# Patient Record
Sex: Female | Born: 1960 | Hispanic: Yes | Marital: Married | Smoking: Former smoker
Health system: Southern US, Community
[De-identification: ages and names within clinical notes are randomized; demographics above are authoritative.]

## PROBLEM LIST (undated history)

## (undated) DIAGNOSIS — N200 Calculus of kidney: Secondary | ICD-10-CM

## (undated) DIAGNOSIS — E785 Hyperlipidemia, unspecified: Secondary | ICD-10-CM

## (undated) DIAGNOSIS — N63 Unspecified lump in unspecified breast: Secondary | ICD-10-CM

## (undated) DIAGNOSIS — R112 Nausea with vomiting, unspecified: Secondary | ICD-10-CM

## (undated) DIAGNOSIS — Z87442 Personal history of urinary calculi: Secondary | ICD-10-CM

## (undated) DIAGNOSIS — R42 Dizziness and giddiness: Secondary | ICD-10-CM

## (undated) DIAGNOSIS — Z9889 Other specified postprocedural states: Secondary | ICD-10-CM

## (undated) DIAGNOSIS — M8580 Other specified disorders of bone density and structure, unspecified site: Secondary | ICD-10-CM

## (undated) HISTORY — PX: INCONTINENCE SURGERY: SHX676

## (undated) HISTORY — PX: OVARIAN CYST REMOVAL: SHX89

## (undated) HISTORY — DX: Other specified disorders of bone density and structure, unspecified site: M85.80

## (undated) HISTORY — PX: UMBILICAL HERNIA REPAIR: SHX196

## (undated) HISTORY — PX: TONSILLECTOMY: SUR1361

## (undated) HISTORY — DX: Calculus of kidney: N20.0

## (undated) HISTORY — PX: APPENDECTOMY: SHX54

## (undated) HISTORY — DX: Unspecified lump in unspecified breast: N63.0

## (undated) HISTORY — PX: BREAST BIOPSY: SHX20

## (undated) HISTORY — DX: Hyperlipidemia, unspecified: E78.5

## (undated) HISTORY — PX: BREAST LUMPECTOMY: SHX2

## (undated) HISTORY — PX: NOSE SURGERY: SHX723

## (undated) HISTORY — PX: CHOLECYSTECTOMY: SHX55

## (undated) HISTORY — PX: WRIST SURGERY: SHX841

## (undated) HISTORY — PX: ABDOMINAL HYSTERECTOMY: SHX81

## (undated) HISTORY — PX: ABDOMINOPLASTY: SUR9

---

## 2005-09-01 HISTORY — PX: TOTAL VAGINAL HYSTERECTOMY: SHX2548

## 2016-08-27 ENCOUNTER — Ambulatory Visit (INDEPENDENT_AMBULATORY_CARE_PROVIDER_SITE_OTHER): Payer: BLUE CROSS/BLUE SHIELD | Admitting: Family Medicine

## 2016-08-27 ENCOUNTER — Encounter: Payer: Self-pay | Admitting: Family Medicine

## 2016-08-27 VITALS — BP 138/88 | HR 63 | Ht 61.0 in | Wt 124.2 lb

## 2016-08-27 DIAGNOSIS — E785 Hyperlipidemia, unspecified: Secondary | ICD-10-CM | POA: Insufficient documentation

## 2016-08-27 DIAGNOSIS — N952 Postmenopausal atrophic vaginitis: Secondary | ICD-10-CM

## 2016-08-27 DIAGNOSIS — M8589 Other specified disorders of bone density and structure, multiple sites: Secondary | ICD-10-CM

## 2016-08-27 DIAGNOSIS — N200 Calculus of kidney: Secondary | ICD-10-CM | POA: Diagnosis not present

## 2016-08-27 DIAGNOSIS — Z0000 Encounter for general adult medical examination without abnormal findings: Secondary | ICD-10-CM

## 2016-08-27 DIAGNOSIS — N63 Unspecified lump in unspecified breast: Secondary | ICD-10-CM | POA: Diagnosis not present

## 2016-08-27 DIAGNOSIS — Z780 Asymptomatic menopausal state: Secondary | ICD-10-CM | POA: Insufficient documentation

## 2016-08-27 DIAGNOSIS — Z1389 Encounter for screening for other disorder: Secondary | ICD-10-CM

## 2016-08-27 DIAGNOSIS — M8580 Other specified disorders of bone density and structure, unspecified site: Secondary | ICD-10-CM | POA: Insufficient documentation

## 2016-08-27 DIAGNOSIS — E782 Mixed hyperlipidemia: Secondary | ICD-10-CM

## 2016-08-27 NOTE — Patient Instructions (Addendum)
Carpal Tunnel Syndrome Introduction Carpal tunnel syndrome is a condition that causes pain in your hand and arm. The carpal tunnel is a narrow area that is on the palm side of your wrist. Repeated wrist motion or certain diseases may cause swelling in the tunnel. This swelling can pinch the main nerve in the wrist (median nerve). Follow these instructions at home: If you have a splint:  Wear it as told by your doctor. Remove it only as told by your doctor.  Loosen the splint if your fingers:  Become numb and tingle.  Turn blue and cold.  Keep the splint clean and dry. General instructions  Take over-the-counter and prescription medicines only as told by your doctor.  Rest your wrist from any activity that may be causing your pain. If needed, talk to your employer about changes that can be made in your work, such as getting a wrist pad to use while typing.  If directed, apply ice to the painful area:  Put ice in a plastic bag.  Place a towel between your skin and the bag.  Leave the ice on for 20 minutes, 2-3 times per day.  Keep all follow-up visits as told by your doctor. This is important.  Do any exercises as told by your doctor, physical therapist, or occupational therapist. Contact a doctor if:  You have new symptoms.  Medicine does not help your pain.  Your symptoms get worse. This information is not intended to replace advice given to you by your health care provider. Make sure you discuss any questions you have with your health care provider. Document Released: 08/07/2011 Document Revised: 01/24/2016 Document Reviewed: 01/03/2015  2017 Elsevier    Please realize, EXERCISE IS MEDICINE!  -  American Heart Association [facility]) guidelines for exercise : If you are in good health, without any medical conditions, you should engage in 150 minutes of moderate intensity aerobic activity per week.  This means you should be huffing and puffing throughout your workout.    Engaging in regular exercise will improve brain function and memory, as well as improve mood, boost immune system and help with weight management.  As well as the other, more well-known effects of exercise such as decreasing blood sugar levels, decreasing blood pressure,  and decreasing bad cholesterol levels/ increasing good cholesterol levels.     -  The AHA strongly endorses consumption of a diet that contains a variety of foods from all the food categories with an emphasis on fruits and vegetables; fat-free and low-fat dairy products; cereal and grain products; legumes and nuts; and fish, poultry, and/or extra lean meats.    Excessive food intake, especially of foods high in saturated and trans fats, sugar, and salt, should be avoided.    Adequate water intake of roughly 1/2 of your weight in pounds, should equal the ounces of water per day you should drink.  So for instance, if you're 200 pounds, that would be 100 ounces of water per day.         Mediterranean Diet  Why follow it? Research shows. . Those who follow the Mediterranean diet have a reduced risk of heart disease  . The diet is associated with a reduced incidence of Parkinson's and Alzheimer's diseases . People following the diet may have longer life expectancies and lower rates of chronic diseases  . The Dietary Guidelines for Americans recommends the Mediterranean diet as an eating plan to promote health and prevent disease  What Is the Mediterranean Diet?  Marland Kitchen  Healthy eating plan based on typical foods and recipes of Mediterranean-style cooking . The diet is primarily a plant based diet; these foods should make up a majority of meals   Starches - Plant based foods should make up a majority of meals - They are an important sources of vitamins, minerals, energy, antioxidants, and fiber - Choose whole grains, foods high in fiber and minimally processed items  - Typical grain sources include wheat, oats, barley, corn, brown rice,  bulgar, farro, millet, polenta, couscous  - Various types of beans include chickpeas, lentils, fava beans, black beans, white beans   Fruits  Veggies - Large quantities of antioxidant rich fruits & veggies; 6 or more servings  - Vegetables can be eaten raw or lightly drizzled with oil and cooked  - Vegetables common to the traditional Mediterranean Diet include: artichokes, arugula, beets, broccoli, brussel sprouts, cabbage, carrots, celery, collard greens, cucumbers, eggplant, kale, leeks, lemons, lettuce, mushrooms, okra, onions, peas, peppers, potatoes, pumpkin, radishes, rutabaga, shallots, spinach, sweet potatoes, turnips, zucchini - Fruits common to the Mediterranean Diet include: apples, apricots, avocados, cherries, clementines, dates, figs, grapefruits, grapes, melons, nectarines, oranges, peaches, pears, pomegranates, strawberries, tangerines  Fats - Replace butter and margarine with healthy oils, such as olive oil, canola oil, and tahini  - Limit nuts to no more than a handful a day  - Nuts include walnuts, almonds, pecans, pistachios, pine nuts  - Limit or avoid candied, honey roasted or heavily salted nuts - Olives are central to the [employer] - can be eaten whole or used in a variety of dishes   Meats Protein - Limiting red meat: no more than a few times a month - When eating red meat: choose lean cuts and keep the portion to the size of deck of cards - Eggs: approx. 0 to 4 times a week  - Fish and lean poultry: at least 2 a week  - Healthy protein sources include, chicken, [place], lean beef, lamb - Increase intake of seafood such as tuna, salmon, trout, mackerel, shrimp, scallops - Avoid or limit high fat processed meats such as sausage and bacon  Dairy - Include moderate amounts of low fat dairy products  - Focus on healthy dairy such as fat free yogurt, skim milk, low or reduced fat cheese - Limit dairy products higher in fat such as whole or 2% milk, cheese, ice cream   Alcohol - Moderate amounts of red wine is ok  - No more than 5 oz daily for women (all ages) and men older than age 70  - No more than 10 oz of wine daily for men younger than 6  Other - Limit sweets and other desserts  - Use herbs and spices instead of salt to flavor foods  - Herbs and spices common to the traditional Mediterranean Diet include: basil, bay leaves, chives, cloves, cumin, fennel, garlic, lavender, marjoram, mint, oregano, parsley, pepper, rosemary, sage, savory, sumac, tarragon, thyme   It's not just a diet, it's a lifestyle:  . The Mediterranean diet includes lifestyle factors typical of those in the region  . Foods, drinks and meals are best eaten with others and savored . Daily physical activity is important for overall good health . This could be strenuous exercise like running and aerobics . This could also be more leisurely activities such as walking, housework, yard-work, or taking the stairs . Moderation is the key; a balanced and healthy diet accommodates most foods and drinks . Consider portion sizes and  frequency of consumption of certain foods   Meal Ideas & Options:  . Breakfast:  o Whole wheat toast or whole wheat English muffins with peanut butter & hard boiled egg o Steel cut oats topped with apples & cinnamon and skim milk  o Fresh fruit: banana, strawberries, melon, berries, peaches  o Smoothies: strawberries, bananas, greek yogurt, peanut butter o Low fat greek yogurt with blueberries and granola  o Egg white omelet with spinach and mushrooms o Breakfast couscous: whole wheat couscous, apricots, skim milk, cranberries  . Sandwiches:  o Hummus and grilled vegetables (peppers, zucchini, squash) on whole wheat bread   o Grilled chicken on whole wheat pita with lettuce, tomatoes, cucumbers or tzatziki  o Tuna salad on whole wheat bread: tuna salad made with greek yogurt, olives, red peppers, capers, green onions o Garlic rosemary lamb pita: lamb sauted  with garlic, rosemary, salt & pepper; add lettuce, cucumber, greek yogurt to pita - flavor with lemon juice and black pepper  . Seafood:  o Mediterranean grilled salmon, seasoned with garlic, basil, parsley, lemon juice and black pepper o Shrimp, lemon, and spinach whole-grain pasta salad made with low fat greek yogurt  o Seared scallops with lemon orzo  o Seared tuna steaks seasoned salt, pepper, coriander topped with tomato mixture of olives, tomatoes, olive oil, minced garlic, parsley, green onions and cappers  . Meats:  o Herbed greek chicken salad with kalamata olives, cucumber, feta  o Red bell peppers stuffed with spinach, bulgur, lean ground beef (or lentils) & topped with feta   o Kebabs: skewers of chicken, tomatoes, onions, zucchini, squash  o [place] burgers: made with red onions, mint, dill, lemon juice, feta cheese topped with roasted red peppers . Vegetarian o Cucumber salad: cucumbers, artichoke hearts, celery, red onion, feta cheese, tossed in olive oil & lemon juice  o Hummus and whole grain pita points with a greek salad (lettuce, tomato, feta, olives, cucumbers, red onion) o Lentil soup with celery, carrots made with vegetable broth, garlic, salt and pepper  o Tabouli salad: parsley, bulgur, mint, scallions, cucumbers, tomato, radishes, lemon juice, olive oil, salt and pepper.

## 2016-08-27 NOTE — Progress Notes (Signed)
New patient office visit note:  Impression and Recommendations:    1. Encounter for wellness examination   2. Screening for multiple conditions   3. Mixed hyperlipidemia   4. Osteopenia of multiple sites   5. Benign breast lumps- B/L FNA's - both Benign   6. s/p Calcium nephrolithiasis   7. Vaginal atrophy     Benign breast lumps- B/L FNA's - both Benign Goes q every yr- due every Jan  Osteopenia-  q 2 yrs bone scan-  last one was bone density 2015 Was on fosamax 3 yrs or so- stopped 2105.    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  - I still recommend we check vitamin D levels in the future.  h/o Hyperlipidemia A/  first found out about condition approximately 7 yrs ago or so.  No S-E to simvastatin.  P/   Cont meds, will check FLP, CMP near future as pt is NOT fasting today.     Vaginal atrophy A)  Started meds about one year ago.  Works well and no-s-e  P)  Cont meds.      Orders Placed This Encounter  Procedures  . CBC with Differential/Platelet  . Hemoglobin A1c  . Lipid panel  . T4, free  . TSH  . VITAMIN D 25 Hydroxy (Vit-D Deficiency, Fractures)     New Prescriptions   No medications on file     Modified Medications   No medications on file     Discontinued Medications   No medications on file     The patient was counseled, risk factors were discussed, anticipatory guidance given.  Gross side effects, risk and benefits, and alternatives of medications discussed with patient.  Patient is aware that all medications have potential side effects and we are unable to predict every side effect or drug-drug interaction that may occur.  Expresses verbal understanding and consents to current therapy plan and treatment regimen.  Return in about 3 months (around 11/25/2016), or for chronic mgt dx, for lab only OV near future- CMP, CBC, FLP, VIt D, TSH, Free T4.  Please see AVS handed out to patient at  the end of our visit for further patient instructions/ counseling done pertaining to today's office visit.    Note: This document was prepared using Dragon voice recognition software and may include unintentional dictation errors.  ----------------------------------------------------------------------------------------------------------------------    Subjective:    Chief Complaint  Patient presents with  . Establish Care    HPI: Rachael Clark is a pleasant 55 y.o. female who presents to [place] at [facility] today to review their medical history with me and establish care.   I asked the patient to review their chronic problem list with me to ensure everything was updated and accurate.    Moved here recently from [place].   Partner- is Marcello Moores- been together for one year.   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  Patient has 3 children ages 102, 18 and 42.  They're all out of the house.  Patient quit smoking in 1997.  She works out approximately 30 minutes to an hour 2 days a week.  She enjoys an alcoholic beverage 4-5 days a week.  Patient with history of osteopenia, breast lumps and calcium oxalate nephrolithiasis.  Patient some questions about carpal tunnel syndrome.  We discussed and handouts were provided.      Wt Readings from Last 3 Encounters:  08/27/16 124 lb 3.2 oz (56.3 kg)   BP Readings from Last 3 Encounters:  08/27/16 138/88   Pulse Readings from Last 3 Encounters:  08/27/16 63   BMI Readings from Last 3 Encounters:  08/27/16 23.47 kg/m    Patient Care Team    Relationship Specialty Notifications Start End  Mellody Dance, DO PCP - General Family Medicine  08/27/16     Patient Active Problem List   Diagnosis Date Noted  . Benign breast lumps- B/L FNA's - both Benign 08/27/2016  . Osteopenia-  q 2 yrs bone scan-  last one was bone density 2015 08/27/2016    . 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 h/o Hyperlipidemia 08/27/2016  . Vaginal atrophy 08/27/2016     Past Medical History:  Diagnosis Date  . Benign breast lumps   . Hyperlipidemia   . Kidney stone   . Osteopenia      Past Medical History:  Diagnosis Date  . Benign breast lumps   . Hyperlipidemia   . Kidney stone   . Osteopenia      Past Surgical History:  Procedure Laterality Date  . ABDOMINAL HYSTERECTOMY    . APPENDECTOMY    . BREAST LUMPECTOMY Bilateral   . CHOLECYSTECTOMY    . OVARIAN CYST REMOVAL    . TONSILLECTOMY    . WRIST SURGERY Right      Family History  Problem Relation Age of Onset  . Osteoporosis Mother   . Dementia Mother   . Healthy Father   . Healthy Sister   . Healthy Brother   . Healthy Daughter   . Healthy Son   . Healthy Sister   . Healthy Sister   . Healthy Brother   . Healthy Son      History  Drug Use No    History  Alcohol Use  . 2.4 oz/week  . 4 Glasses of wine per week    History  Smoking Status  . Former Smoker  . Packs/day: 0.25  . Years: 4.00  . Quit date: 05/02/1996  Smokeless Tobacco  . Never Used     Patient's Medications  New Prescriptions   No medications on file  Previous Medications   SIMVASTATIN (ZOCOR) 10 MG TABLET    Take 1 tablet by mouth daily.   YUVAFEM 10 MCG TABS VAGINAL TABLET    Take 1 tablet by mouth 2 (two) times a week.  Modified Medications   No medications on file  Discontinued Medications   No medications on file    Allergies: Patient has no known allergies.  Review of Systems  Constitutional: Negative for chills, diaphoresis, fever, malaise/fatigue and weight loss.  HENT: Negative for congestion, sore throat and tinnitus.   Eyes: Negative for blurred vision, double vision and photophobia.  Respiratory: Negative for cough and wheezing.   Cardiovascular: Negative for chest pain and palpitations.  Gastrointestinal: Negative for blood in stool, diarrhea, nausea and  vomiting.  Genitourinary: Negative for dysuria, frequency and urgency.  Musculoskeletal: Negative for joint pain and myalgias.  Skin: Negative for itching and rash.  Neurological: Negative for dizziness, focal weakness, weakness and headaches.  Endo/Heme/Allergies: Negative for environmental allergies and polydipsia. Does not bruise/bleed easily.  Psychiatric/Behavioral: Negative for depression and memory loss. The patient is not nervous/anxious and does not have insomnia.      Objective:    Blood pressure 138/88, pulse 63,  height 5\' 1"  (1.549 m), weight 124 lb 3.2 oz (56.3 kg). Body mass index is 23.47 kg/m. General: Well Developed, well nourished, and in no acute distress.  Neuro: Alert and oriented x3, extra-ocular muscles intact, sensation grossly intact.  HEENT: Normocephalic, atraumatic, pupils equal round reactive to light, neck supple Skin: no gross rashes  Cardiac: Regular rate and rhythm Respiratory: Essentially clear to auscultation bilaterally. Not using accessory muscles, speaking in full sentences.  Abdominal: not grossly distended Musculoskeletal: Ambulates w/o diff, FROM * 4 ext.  Vasc: less 2 sec cap RF, warm and pink  Psych:  No HI/SI, judgement and insight good, Euthymic mood. Full Affect.

## 2016-08-27 NOTE — Assessment & Plan Note (Signed)
>>  ASSESSMENT AND PLAN FOR OSTEOPENIA-  Q 2 YRS BONE SCAN-  LAST ONE WAS BONE DENSITY 2015 WRITTEN ON 09/21/2016  8:41 PM BY OPALSKI, [place], DO  Was on fosamax 3 yrs or so- stopped 2105.    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  - I still recommend we check vitamin D levels in the future.

## 2016-08-27 NOTE — Assessment & Plan Note (Signed)
A)  Started meds about one year ago.  Works well and no-s-e  P)  Cont meds.

## 2016-08-27 NOTE — Assessment & Plan Note (Addendum)
A/  first found out about condition approximately 7 yrs ago or so.  No S-E to simvastatin.  P/   Cont meds, will check FLP, CMP near future as pt is NOT fasting today.

## 2016-08-27 NOTE — Assessment & Plan Note (Signed)
Goes q every yr- due every Jan

## 2016-08-27 NOTE — Assessment & Plan Note (Addendum)
Was on fosamax 3 yrs or so- stopped 2105.    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  - I still recommend we check vitamin D levels in the future.

## 2016-09-09 ENCOUNTER — Other Ambulatory Visit: Payer: Self-pay

## 2016-09-09 ENCOUNTER — Other Ambulatory Visit (INDEPENDENT_AMBULATORY_CARE_PROVIDER_SITE_OTHER): Payer: BLUE CROSS/BLUE SHIELD

## 2016-09-09 DIAGNOSIS — Z131 Encounter for screening for diabetes mellitus: Secondary | ICD-10-CM

## 2016-09-09 DIAGNOSIS — R5383 Other fatigue: Secondary | ICD-10-CM

## 2016-09-09 DIAGNOSIS — M8589 Other specified disorders of bone density and structure, multiple sites: Secondary | ICD-10-CM | POA: Diagnosis not present

## 2016-09-09 DIAGNOSIS — N200 Calculus of kidney: Secondary | ICD-10-CM

## 2016-09-09 DIAGNOSIS — N952 Postmenopausal atrophic vaginitis: Secondary | ICD-10-CM

## 2016-09-09 DIAGNOSIS — E782 Mixed hyperlipidemia: Secondary | ICD-10-CM

## 2016-09-09 DIAGNOSIS — N63 Unspecified lump in unspecified breast: Secondary | ICD-10-CM

## 2016-09-10 LAB — COMPREHENSIVE METABOLIC PANEL
A/G RATIO: 2 (ref 1.2–2.2)
ALK PHOS: 58 IU/L (ref 39–117)
ALT: 21 IU/L (ref 0–32)
AST: 18 IU/L (ref 0–40)
Albumin: 4.5 g/dL (ref 3.5–5.5)
BILIRUBIN TOTAL: 0.8 mg/dL (ref 0.0–1.2)
BUN/Creatinine Ratio: 21 (ref 9–23)
BUN: 14 mg/dL (ref 6–24)
CHLORIDE: 102 mmol/L (ref 96–106)
CO2: 24 mmol/L (ref 18–29)
Calcium: 9.1 mg/dL (ref 8.7–10.2)
Creatinine, Ser: 0.66 mg/dL (ref 0.57–1.00)
GFR calc non Af Amer: 100 mL/min/{1.73_m2} (ref >59)
GFR, EST AFRICAN AMERICAN: 115 mL/min/{1.73_m2} (ref >59)
GLUCOSE: 99 mg/dL (ref 65–99)
Globulin, Total: 2.2 g/dL (ref 1.5–4.5)
POTASSIUM: 4.5 mmol/L (ref 3.5–5.2)
Sodium: 141 mmol/L (ref 134–144)
Total Protein: 6.7 g/dL (ref 6.0–8.5)

## 2016-09-10 LAB — CBC WITH DIFFERENTIAL/PLATELET
BASOS ABS: 0 10*3/uL (ref 0.0–0.2)
Basos: 0 %
EOS (ABSOLUTE): 0.2 10*3/uL (ref 0.0–0.4)
Eos: 2 %
HEMOGLOBIN: 12.7 g/dL (ref 11.1–15.9)
Hematocrit: 37.3 % (ref 34.0–46.6)
IMMATURE GRANS (ABS): 0 10*3/uL (ref 0.0–0.1)
Immature Granulocytes: 0 %
LYMPHS: 22 %
Lymphocytes Absolute: 1.8 10*3/uL (ref 0.7–3.1)
MCH: 31.7 pg (ref 26.6–33.0)
MCHC: 34 g/dL (ref 31.5–35.7)
MCV: 93 fL (ref 79–97)
MONOCYTES: 6 %
Monocytes Absolute: 0.5 10*3/uL (ref 0.1–0.9)
NEUTROS PCT: 70 %
Neutrophils Absolute: 5.6 10*3/uL (ref 1.4–7.0)
PLATELETS: 274 10*3/uL (ref 150–379)
RBC: 4.01 x10E6/uL (ref 3.77–5.28)
RDW: 13.4 % (ref 12.3–15.4)
WBC: 8 10*3/uL (ref 3.4–10.8)

## 2016-09-10 LAB — LIPID PANEL
CHOL/HDL RATIO: 2.2 ratio (ref 0.0–4.4)
Cholesterol, Total: 195 mg/dL (ref 100–199)
HDL: 89 mg/dL (ref >39)
LDL Calculated: 96 mg/dL (ref 0–99)
Triglycerides: 51 mg/dL (ref 0–149)
VLDL Cholesterol Cal: 10 mg/dL (ref 5–40)

## 2016-09-10 LAB — HEMOGLOBIN A1C
ESTIMATED AVERAGE GLUCOSE: 114 mg/dL
HEMOGLOBIN A1C: 5.6 % (ref 4.8–5.6)

## 2016-09-10 LAB — T4, FREE: FREE T4: 1.29 ng/dL (ref 0.82–1.77)

## 2016-09-10 LAB — TSH: TSH: 0.804 u[IU]/mL (ref 0.450–4.500)

## 2016-09-10 LAB — VITAMIN D 25 HYDROXY (VIT D DEFICIENCY, FRACTURES): VIT D 25 HYDROXY: 39.1 ng/mL (ref 30.0–100.0)

## 2016-09-25 ENCOUNTER — Other Ambulatory Visit: Payer: Self-pay

## 2016-09-25 ENCOUNTER — Telehealth: Payer: Self-pay | Admitting: Family Medicine

## 2016-09-25 MED ORDER — SIMVASTATIN 10 MG PO TABS: 10.0000 mg | ORAL_TABLET | Freq: Every day | ORAL | 0 refills | Status: DC

## 2016-09-25 NOTE — Telephone Encounter (Signed)
Pt called states need Simvastatin refill cld into [facility] pharmacy in [place].  Thanks Baker Janus

## 2016-10-10 ENCOUNTER — Other Ambulatory Visit: Payer: Self-pay | Admitting: Family Medicine

## 2016-11-11 ENCOUNTER — Ambulatory Visit: Payer: BLUE CROSS/BLUE SHIELD | Admitting: Family Medicine

## 2016-11-12 ENCOUNTER — Other Ambulatory Visit: Payer: Self-pay | Admitting: Family Medicine

## 2016-11-12 NOTE — Telephone Encounter (Signed)
We have not prescribed this medication for the patient before.  Please authorize if appropriate.  Charyl Bigger, CMA

## 2016-11-20 ENCOUNTER — Ambulatory Visit: Payer: BLUE CROSS/BLUE SHIELD | Admitting: Family Medicine

## 2017-01-07 ENCOUNTER — Other Ambulatory Visit: Payer: Self-pay | Admitting: Family Medicine

## 2017-01-22 ENCOUNTER — Encounter: Payer: Self-pay | Admitting: Family Medicine

## 2017-01-22 ENCOUNTER — Ambulatory Visit (INDEPENDENT_AMBULATORY_CARE_PROVIDER_SITE_OTHER): Payer: BLUE CROSS/BLUE SHIELD | Admitting: Family Medicine

## 2017-01-22 VITALS — BP 101/69 | HR 65 | Temp 97.6°F | Ht 61.0 in | Wt 124.0 lb

## 2017-01-22 DIAGNOSIS — N63 Unspecified lump in unspecified breast: Secondary | ICD-10-CM

## 2017-01-22 DIAGNOSIS — E782 Mixed hyperlipidemia: Secondary | ICD-10-CM | POA: Diagnosis not present

## 2017-01-22 DIAGNOSIS — Z1231 Encounter for screening mammogram for malignant neoplasm of breast: Secondary | ICD-10-CM | POA: Insufficient documentation

## 2017-01-22 DIAGNOSIS — N952 Postmenopausal atrophic vaginitis: Secondary | ICD-10-CM | POA: Diagnosis not present

## 2017-01-22 DIAGNOSIS — M7552 Bursitis of left shoulder: Secondary | ICD-10-CM

## 2017-01-22 DIAGNOSIS — G8929 Other chronic pain: Secondary | ICD-10-CM

## 2017-01-22 DIAGNOSIS — T148XXA Other injury of unspecified body region, initial encounter: Secondary | ICD-10-CM | POA: Insufficient documentation

## 2017-01-22 DIAGNOSIS — M25512 Pain in left shoulder: Secondary | ICD-10-CM

## 2017-01-22 DIAGNOSIS — M069 Rheumatoid arthritis, unspecified: Secondary | ICD-10-CM | POA: Diagnosis not present

## 2017-01-22 MED ORDER — ESTRADIOL 10 MCG VA TABS: ORAL_TABLET | VAGINAL | 3 refills | Status: DC

## 2017-01-22 MED ORDER — MELOXICAM 15 MG PO TABS: 15.0000 mg | ORAL_TABLET | Freq: Every day | ORAL | 1 refills | Status: DC

## 2017-01-22 MED ORDER — SIMVASTATIN 10 MG PO TABS: 10.0000 mg | ORAL_TABLET | Freq: Every day | ORAL | 1 refills | Status: DC

## 2017-01-22 NOTE — Assessment & Plan Note (Signed)
-   Counseled patient on connection between muscle strain to over compensate for her left shoulder pain.  Handouts provided. Anti-inflammatories and moist heat to muscles only. Recommend physical therapy which patient declines today.

## 2017-01-22 NOTE — Assessment & Plan Note (Signed)
Refill of vaginal tablets given. Patient will need Pap smear every 3 years.

## 2017-01-22 NOTE — Assessment & Plan Note (Signed)
-  RF of meds given to pt - No complaints and tolerating well.  Last lipid panel as follows:  Lab Results  Component Value Date   CHOL 195 09/09/2016   HDL 89 09/09/2016   LDLCALC 96 09/09/2016   TRIG 51 09/09/2016   CHOLHDL 2.2 09/09/2016    Hepatic Function Latest Ref Rng & Units 09/09/2016  Total Protein 6.0 - 8.5 g/dL 6.7  Albumin 3.5 - 5.5 g/dL 4.5  AST 0 - 40 IU/L 18  ALT 0 - 32 IU/L 21  Alk Phosphatase 39 - 117 IU/L 58  Total Bilirubin 0.0 - 1.2 mg/dL 0.8

## 2017-01-22 NOTE — Assessment & Plan Note (Signed)
Patient declines x-ray today as I discussed the limited utility of it in this case.  - Ice 15 minutes several times a day.    Avoid repetitive activities-which patient cannot cut she works and does a lot of lifting.  Patient declined physical therapy referral and understands this is definitive treatment for this condition.  If we cannot control her symptoms with ice and anti-inflammatories, it was refer her to sports med or orthopedist for injection

## 2017-01-22 NOTE — Assessment & Plan Note (Signed)
Referral for mammogram placed today. 

## 2017-01-22 NOTE — Assessment & Plan Note (Addendum)
-   Recommended rehabilitation the patient declined today.  After discussion we decided to hold off on rheumatoid lab work as she has significant clinical findings and this would not change our management strategy at this time.  We will consider rheumatology referral in the future if we cannot control her symptoms.  Counseling done regarding disease process and various handouts provided  She had questions regarding B12 would help with her joint pains.  I recommended to reck in that she read on the [facility] website for recommended doses that may be beneficial for her symptoms.  All questions answered

## 2017-01-22 NOTE — Patient Instructions (Signed)
Please see handouts on shoulder bursitis as well as muscle strain and spasms I gave you.  Please note to use ice 15 minutes several times per day to shoulder and swollen joints.   You can use heat on spasms and muscle strains. - If you like a referral to physical therapy please let me know if this really is part of the definitive treatment for these conditions.  - Tumeric is a natural substance which can be helpful in arthritic and chronic inflammatory conditions.  Since this is not FDA approved you can do online research at the [facility] and see what doses they recommend of tumeric for your arthritis.  - Put in referral for mammogram.  They should call you for an appointment, if they do not please let Dorothea Ogle know.     Rheumatoid Arthritis Rheumatoid arthritis (RA) is a long-term (chronic) disease. RA causes inflammation in your joints. Your joints may feel painful, stiff, swollen, warm, or tender. RA may start slowly. Usually, it affects the small joints of the hands and feet. It can also affect other parts of the body, even the heart, eyes, or lungs. Symptoms of RA often come and go. Sometimes, symptoms get worse for a while. These are called flares. There is no cure for RA, but your doctor will work with you to find the best treatment option for you. This will depend on how the disease is changing in your body. Follow these instructions at home:  Take over-the-counter and prescription medicines only as told by your doctor. Your doctor may change (adjust) your medicines every 3 months.  Start an exercise program as told by your doctor.  Rest when you have a flare.  Return to your normal activities as told by your doctor. Ask your doctor what activities are safe for you.  Keep all follow-up visits as told by your doctor. This is important. Contact a doctor if:  You have a flare.  You have a fever.  You have problems (side effects) because of your medicines. Get help right away  if:  You have chest pain.  You have trouble breathing.  You have a hot, painful joint all of a sudden, and it is worse than your usual joint aches. This information is not intended to replace advice given to you by your health care provider. Make sure you discuss any questions you have with your health care provider. Document Released: 11/10/2011 Document Revised: 01/24/2016 Document Reviewed: 05/31/2015 Elsevier Interactive Patient Education  2017 [employer].

## 2017-01-22 NOTE — Progress Notes (Signed)
Impression and Recommendations:    1. Benign breast lumps- B/L FNA's - both Benign   2. Mixed hyperlipidemia   3. Vaginal atrophy   4. Rheumatoid arthritis involving both hands, unspecified rheumatoid factor presence (Rachael Clark)   5. Chronic left shoulder pain   6. Chronic shoulder bursitis, left   7. Muscle strain   8. Encounter for screening mammogram for breast cancer      Benign breast lumps- B/L FNA's - both Benign Referral for mammogram placed today.  h/o Hyperlipidemia - RF of meds given to pt - No complaints and tolerating well.  Last lipid panel as follows:  Lab Results  Component Value Date   CHOL 195 09/09/2016   HDL 89 09/09/2016   LDLCALC 96 09/09/2016   TRIG 51 09/09/2016   CHOLHDL 2.2 09/09/2016    Hepatic Function Latest Ref Rng & Units 09/09/2016  Total Protein 6.0 - 8.5 g/dL 6.7  Albumin 3.5 - 5.5 g/dL 4.5  AST 0 - 40 IU/L 18  ALT 0 - 32 IU/L 21  Alk Phosphatase 39 - 117 IU/L 58  Total Bilirubin 0.0 - 1.2 mg/dL 0.8      Vaginal atrophy Refill of vaginal tablets given. Patient will need Pap smear every 3 years.  Muscle strain - Counseled patient on connection between muscle strain to over compensate for her left shoulder pain.  Handouts provided. Anti-inflammatories and moist heat to muscles only. Recommend physical therapy which patient declines today.  Rheumatoid arthritis involving both hands (Rachael Clark) - Recommended rehabilitation the patient declined today.  After discussion we decided to hold off on rheumatoid lab work as she has significant clinical findings and this would not change our management strategy at this time.  We will consider rheumatology referral in the future if we cannot control her symptoms.  Counseling done regarding disease process and various handouts provided  She had questions regarding B12 would help with her joint pains.  I recommended to reck in that she read on the Rachael Clark website for recommended doses that  may be beneficial for her symptoms.  All questions answered      Chronic left shoulder pain Patient declines x-ray today as I discussed the limited utility of it in this case.  - Ice 15 minutes several times a day.    Avoid repetitive activities-which patient cannot cut she works and does a lot of lifting.  Patient declined physical therapy referral and understands this is definitive treatment for this condition.  If we cannot control her symptoms with ice and anti-inflammatories, it was refer her to sports med or orthopedist for injection  Chronic shoulder bursitis, left Condition pathophysiology discussed with patient.    Ice and avoid overuse.   The patient was counseled, risk factors were discussed, anticipatory guidance given.    Meds ordered this encounter  Medications  . meloxicam (MOBIC) 15 MG tablet    Sig: Take 1 tablet (15 mg total) by mouth daily.    Dispense:  90 tablet    Refill:  1  . Estradiol (YUVAFEM) 10 MCG TABS vaginal tablet    Sig: Insert 1 TABLET TWICE A WEEK into vagina    Dispense:  24 tablet    Refill:  3  . simvastatin (ZOCOR) 10 MG tablet    Sig: Take 1 tablet (10 mg total) by mouth at bedtime. Patient must have office visit before any further refills.    Dispense:  90 tablet    Refill:  1  Orders Placed This Encounter  Procedures  . MM Digital Screening     Gross side effects, risk and benefits, and alternatives of medications and treatment plan in general discussed with patient.  Patient is aware that all medications have potential side effects and we are unable to predict every side effect or drug-drug interaction that may occur.   Patient will call with any questions prior to using medication if they have concerns.  Expresses verbal understanding and consents to current therapy and treatment regimen.  No barriers to understanding were identified.  Red flag symptoms and signs discussed in detail.  Patient expressed understanding  regarding what to do in case of emergency\urgent symptoms  Please see AVS handed out to patient at the end of our visit for further patient instructions/ counseling done pertaining to today's office visit.   Return in about 8 weeks (around 03/19/2017) for f/up of arthritis pain; shoulder pain and new med-MOBIC.     Note: This document was prepared using Dragon voice recognition software and may include unintentional dictation errors.  Rachael Clark 9:19 AM --------------------------------------------------------------------------------------------------------------------------------------------------------------------------------------------------------------------------------------------    Subjective:    CC:  Chief Complaint  Patient presents with  . Medication Management    HPI: Rachael Clark is a 56 y.o. female who presents to [place] at [facility] today for issues as discussed below.  ---> Patient only recently moved from [place] and this is her first winter in [place].  At the start of cold weather back in December 2017 patient had onset of left shoulder and bilateral hand pain.  1.  Left shoulder pain:   Denies any fall or injury, made worse with overhead activities and any lifting, pain is a constant ache and worse at night and even wakes her out of bed.  Improved, nothing.  She has never iced or used any pain meds besides over-the-counter Tylenol.  She has never had any physical therapy for formal treatment.  She denies any neck pain or radiculopathy. 2. Left sided chest wall muscular pain:  Patient is concerned as she has had benign breast lumps in her breast.  She is concerned because now she's had left axilla and chest wall pain ever since her shoulder has acted up.  She is concerned this is breast cancer.  Her last mammogram was in December 2016   Per patient. 3.     Bilateral hands painful and swollen:   PIP joints bilaterally hurt and  is worse at night after activity once resting and also first thing in the morning.  Once she is up and active, the pain dissipates.  She has never sought any evaluation of these in the past and has never had these complaints prior.  No known injury.  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  Made worse with-rest and at the end of a long working day.  Improved with-nothing.  Denies any numbness tingling or dysfunction.  She denies any locking of her fingers.  There is no radicular symptoms from the shoulder down to the hand. 4.     CHOL HPI:   -  She  is currently managed with:  See med list from today  Txmnt compliance- excellent.  Needs refill.  Muscle aches-  not diffuse only localized to left shoulder region and surrounding areas.  No oher s-e  Last lipid panel as follows:  Lab Results  Component Value Date   CHOL 195 09/09/2016  HDL 89 09/09/2016   LDLCALC 96 09/09/2016   TRIG 51 09/09/2016   CHOLHDL 2.2 09/09/2016    Hepatic Function Latest Ref Rng & Units 09/09/2016  Total Protein 6.0 - 8.5 g/dL 6.7  Albumin 3.5 - 5.5 g/dL 4.5  AST 0 - 40 IU/L 18  ALT 0 - 32 IU/L 21  Alk Phosphatase 39 - 117 IU/L 58  Total Bilirubin 0.0 - 1.2 mg/dL 0.8     Wt Readings from Last 3 Encounters:  01/22/17 124 lb (56.2 kg)  08/27/16 124 lb 3.2 oz (56.3 kg)   BP Readings from Last 3 Encounters:  01/22/17 101/69  08/27/16 138/88   Pulse Readings from Last 3 Encounters:  01/22/17 65  08/27/16 63   BMI Readings from Last 3 Encounters:  01/22/17 23.43 kg/m  08/27/16 23.47 kg/m     Patient Care Team    Relationship Specialty Notifications Start End  Rachael Dance, DO PCP - General Family Medicine  08/27/16      Patient Active Problem List   Diagnosis Date Noted  . Rheumatoid arthritis involving both hands ([place]) 01/22/2017  . Chronic left shoulder pain 01/22/2017  . Chronic shoulder bursitis, left 01/22/2017  . Muscle strain 01/22/2017  . Encounter  for screening mammogram for breast cancer 01/22/2017  . Benign breast lumps- B/L FNA's - both Benign 08/27/2016  . Osteopenia-  q 2 yrs bone scan-  last one was bone density 2015 08/27/2016  . 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 h/o Hyperlipidemia 08/27/2016  . Vaginal atrophy 08/27/2016    Past Medical history, Surgical history, Family history, Social history, Allergies and Medications have been entered into the medical record, reviewed and changed as needed.    Current Meds  Medication Sig  . Estradiol (YUVAFEM) 10 MCG TABS vaginal tablet Insert 1 TABLET TWICE A WEEK into vagina  . simvastatin (ZOCOR) 10 MG tablet Take 1 tablet (10 mg total) by mouth at bedtime. Patient must have office visit before any further refills.  . [DISCONTINUED] simvastatin (ZOCOR) 10 MG tablet Take 1 tablet (10 mg total) by mouth daily. Patient must have office visit before any further refills.  . [DISCONTINUED] YUVAFEM 10 MCG TABS vaginal tablet INSERT 1 TABLET ONCE DAILY FOR 2 WEEKS. MAINTENANCE 1 TABLET TWICE A WEEKLY    Allergies:  No Known Allergies   Review of Systems: Review of Systems: General:   No F/C, wt loss Pulm:   No DIB, SOB, pleuritic chest pain Card:  No CP, palpitations, No dyspnea on exertion Abd:  No n/v/d or pain Ext:  No inc edema from baseline    Objective:   Blood pressure 101/69, pulse 65, temperature 97.6 F (36.4 C), temperature source Oral, height 5' 1"  (1.549 m), weight 124 lb (56.2 kg), SpO2 99 %. Body mass index is 23.43 kg/m. General:  Well Developed, well nourished, appropriate for stated age.  Neuro:  Alert and oriented,  extra-ocular muscles intact  HEENT:  Normocephalic, atraumatic, neck supple, no carotid bruits appreciated  Skin:  no gross rash, warm, pink. Cardiac:  RRR, S1 S2 Respiratory:  ECTA B/L and A/P, Not using accessory muscles, speaking in full sentences- unlabored. Vascular:  Ext warm, no cyanosis apprec.; cap RF less 2 sec. Psych:  No  HI/SI, judgement and insight good, Euthymic mood. Full Affect.  M-SK:  Patient with slight enlargement of the PIP joints bilaterally and tender to palpation.  Full range of motion, no cracking or popping, no trigger finger appreciated.  Neurovascularly intact  distally.  Full range of motion at the wrist/elbow L Shoulder: Inspection reveals mild asymmetry including slight edema in the anterior left shoulder region- especially just inferior to [facility] joint.. Palpation is normal but significant tenderness over L AC joint, as well as the bicipital groove and insertion point of the pectoralis major and minor. ROM is essentially full in all planes. Negative Hawkin's tests, equivocal Neer's test  negative empty can  Rotator cuff muscle strength normal throughout * 4; mild discomfort with external rotation and abd negative Obrien's and good stability. Negative Speed's test Normal scapular function observed - muscle spasms in the upper and mid left trapezius, paracervical and rhomboid regions.

## 2017-01-22 NOTE — Assessment & Plan Note (Signed)
Condition pathophysiology discussed with patient.    Ice and avoid overuse.

## 2017-03-25 ENCOUNTER — Ambulatory Visit (INDEPENDENT_AMBULATORY_CARE_PROVIDER_SITE_OTHER): Payer: BLUE CROSS/BLUE SHIELD | Admitting: Family Medicine

## 2017-03-25 ENCOUNTER — Encounter: Payer: Self-pay | Admitting: Family Medicine

## 2017-03-25 VITALS — BP 128/77 | HR 68 | Ht 61.0 in | Wt 124.0 lb

## 2017-03-25 DIAGNOSIS — M069 Rheumatoid arthritis, unspecified: Secondary | ICD-10-CM | POA: Diagnosis not present

## 2017-03-25 DIAGNOSIS — M25512 Pain in left shoulder: Secondary | ICD-10-CM | POA: Diagnosis not present

## 2017-03-25 DIAGNOSIS — G5603 Carpal tunnel syndrome, bilateral upper limbs: Secondary | ICD-10-CM

## 2017-03-25 DIAGNOSIS — N952 Postmenopausal atrophic vaginitis: Secondary | ICD-10-CM

## 2017-03-25 DIAGNOSIS — G8929 Other chronic pain: Secondary | ICD-10-CM

## 2017-03-25 DIAGNOSIS — E782 Mixed hyperlipidemia: Secondary | ICD-10-CM | POA: Diagnosis not present

## 2017-03-25 DIAGNOSIS — N951 Menopausal and female climacteric states: Secondary | ICD-10-CM | POA: Insufficient documentation

## 2017-03-25 MED ORDER — SIMVASTATIN 10 MG PO TABS: 10.0000 mg | ORAL_TABLET | Freq: Every day | ORAL | 1 refills | Status: DC

## 2017-03-25 MED ORDER — AMITRIPTYLINE HCL 25 MG PO TABS: ORAL_TABLET | ORAL | 0 refills | Status: DC

## 2017-03-25 MED ORDER — ESTRADIOL 10 MCG VA TABS: ORAL_TABLET | VAGINAL | 3 refills | Status: DC

## 2017-03-25 NOTE — Progress Notes (Signed)
Impression and Recommendations:    1. Rheumatoid arthritis involving both hands, unspecified rheumatoid factor presence (HCC)   2. Carpal tunnel syndrome, bilateral   3. Mixed hyperlipidemia   4. Vaginal atrophy   5. Vaginal dryness, menopausal   6. Chronic left shoulder pain    Please see the handouts that I gave you from the [place] of orthopedic surgeons on carpal tunnel, as well as the [employer] that I printed of braces you could buy.  Usual braces every night.  Try to modify activity level especially repetitive motions  Use Mobic/ meloxicam or other NSAIDs such as Advil or Aleve etc as needed for bad pain  Try the amitriptyline\Elavil for at night to help with the neuropathic pain  (numbness tingling and shooting pains)  We'll see you back in 3 months if not improved we can consider physical therapy and possibly sending you to orthopedics or sports med for injections,  Carpal tunnel syndrome, bilateral Discussed with patient she will continue to try to modify her activities during the day  Continue Mobic or other NSAID  Use night braces which I printed off from [employer] of ones she should buy and she will use these every night  Since pain is pretty bad we will start her on Elavil at night for the neuropathic pain and numbness and helping her sleep.  If she cannot modify her activity and if these conservative methods do not work, she knows we can always send her to sports med or orthopedics for an injection in the wrist and/or physical therapy.    And then finally we may need surgery and tendon releases  Chronic left shoulder pain Continue ice cup massage which is working well for you as well as your massage machine at home.  Let me know if this flares again.   h/o Hyperlipidemia Refilled meds given, tolerating well without complaints.  Reminded patient of necessary dietary and lifestyle modifications  Rheumatoid arthritis involving both hands  [facility]) Patient again declined rheumatology referral and/or physical therapy.  She will continue her mobile and other treatments we discussed today and will follow-up if her RA symptoms worsen  Vaginal atrophy Medicines refilled-vaginal tablets.  Patient understands need for every 3-5 year Pap smear depending on HPV status  Pt was in the office today for 40+ minutes, with over 50% time spent in face to face counseling of patients various medical conditions, treatment plans of those medical conditions including medicine management and lifestyle modification, strategies to improve health and well being; and in coordination of care. SEE ABOVE FOR DETAILS  The patient was counseled, risk factors were discussed, anticipatory guidance given.   New Prescriptions   AMITRIPTYLINE (ELAVIL) 25 MG TABLET    take 1-2 tablets at bedtime prn pain/ numbness    Meds ordered this encounter  Medications  . amitriptyline (ELAVIL) 25 MG tablet    Sig: take 1-2 tablets at bedtime prn pain/ numbness    Dispense:  180 tablet    Refill:  0  . Estradiol (YUVAFEM) 10 MCG TABS vaginal tablet    Sig: Insert 1 TABLET TWICE A WEEK into vagina    Dispense:  24 tablet    Refill:  3  . simvastatin (ZOCOR) 10 MG tablet    Sig: Take 1 tablet (10 mg total) by mouth at bedtime. Patient must have office visit before any further refills.    Dispense:  90 tablet    Refill:  1  Gross side effects, risk and benefits, and alternatives of medications and treatment plan in general discussed with patient.  Patient is aware that all medications have potential side effects and we are unable to predict every side effect or drug-drug interaction that may occur.   Patient will call with any questions prior to using medication if they have concerns.  Expresses verbal understanding and consents to current therapy and treatment regimen.  No barriers to understanding were identified.  Red flag symptoms and signs discussed in detail.   Patient expressed understanding regarding what to do in case of emergency\urgent symptoms  Please see AVS handed out to patient at the end of our visit for further patient instructions/ counseling done pertaining to today's office visit.   Return in about 3 months (around 06/25/2017).     Note: This document was prepared using Dragon voice recognition software and may include unintentional dictation errors.  Daren Yeagle 9:01 AM --------------------------------------------------------------------------------------------------------------------------------------------------------------------------------------------------------------------------------------------    Subjective:    CC:  Chief Complaint  Patient presents with  . Follow-up  . Rheumatoid Arthritis    patient still having pain and numbnes in both hands  . Hyperlipidemia    HPI: Rachael Clark is a 56 y.o. female who presents to [place] at [facility] today for issues as discussed below.  1) Hand pains still bothering her.  Esp when she wakes up in the am.  Pain improves with activity- no pain as she uses her hands throughout the day. Mobic- helping some and pain is less but not gone.  Numbness in hands bothering her as well.  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  Also feels shocks in hands b/l- median nerve distribution.  Seen ortho doc in [place]- sx on R hand/thumb 5 yrs ago or so.   2)  RA:  Mobic helping some but still bad  3) shoulder pains/ muscle aches upper back- she actually got a massage machine for at home and that pain is now gone.  She is using it 2-3 times per week and it's helping very much with her upper back and shoulder pain. Doing ice cup massage as well.   Problem  Carpal Tunnel Syndrome, Bilateral  Vaginal Dryness, Menopausal  Rheumatoid Arthritis Involving Both Hands (Hcc)  Chronic Left Shoulder Pain  h/o Hyperlipidemia  Vaginal Atrophy      Wt Readings from Last 3 Encounters:  03/25/17 124 lb (56.2 kg)  01/22/17 124 lb (56.2 kg)  08/27/16 124 lb 3.2 oz (56.3 kg)   BP Readings from Last 3 Encounters:  03/25/17 128/77  01/22/17 101/69  08/27/16 138/88   Pulse Readings from Last 3 Encounters:  03/25/17 68  01/22/17 65  08/27/16 63   BMI Readings from Last 3 Encounters:  03/25/17 23.43 kg/m  01/22/17 23.43 kg/m  08/27/16 23.47 kg/m     Patient Care Team    Relationship Specialty Notifications Start End  Mellody Dance, DO PCP - General Family Medicine  08/27/16      Patient Active Problem List   Diagnosis Date Noted  . Carpal tunnel syndrome, bilateral 03/25/2017  . Vaginal dryness, menopausal 03/25/2017  . Rheumatoid arthritis involving both hands ([place]) 01/22/2017  . Chronic left shoulder pain 01/22/2017  . Chronic shoulder bursitis, left 01/22/2017  . Muscle strain 01/22/2017  . Encounter for screening mammogram for breast cancer 01/22/2017  . Benign breast lumps- B/L FNA's - both Benign 08/27/2016  . Osteopenia-  q  2 yrs bone scan-  last one was bone density 2015 08/27/2016  . 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 h/o Hyperlipidemia 08/27/2016  . Vaginal atrophy 08/27/2016    Past Medical history, Surgical history, Family history, Social history, Allergies and Medications have been entered into the medical record, reviewed and changed as needed.    Current Meds  Medication Sig  . Estradiol (YUVAFEM) 10 MCG TABS vaginal tablet Insert 1 TABLET TWICE A WEEK into vagina  . meloxicam (MOBIC) 15 MG tablet Take 1 tablet (15 mg total) by mouth daily.  . simvastatin (ZOCOR) 10 MG tablet Take 1 tablet (10 mg total) by mouth at bedtime. Patient must have office visit before any further refills.  . [DISCONTINUED] Estradiol (YUVAFEM) 10 MCG TABS vaginal tablet Insert 1 TABLET TWICE A WEEK into vagina  . [DISCONTINUED] simvastatin (ZOCOR) 10 MG tablet Take 1 tablet (10 mg total) by mouth at  bedtime. Patient must have office visit before any further refills.    Allergies:  No Known Allergies   Review of Systems: General:   Denies fever, chills, unexplained weight loss.  Optho/Auditory:   Denies visual changes, blurred vision/LOV Respiratory:   Denies wheeze, DOE more than baseline levels.  Cardiovascular:   Denies chest pain, palpitations, new onset peripheral edema  Gastrointestinal:   Denies nausea, vomiting, diarrhea, abd pain.  Genitourinary: Denies dysuria, freq/ urgency, flank pain or discharge from genitals.  Endocrine:     Denies hot or cold intolerance, polyuria, polydipsia. Musculoskeletal:   Denies unexplained myalgias, joint swelling, unexplained arthralgias, gait problems.  Skin:  Denies new onset rash, suspicious lesions Neurological:     Denies dizziness, unexplained weakness, numbness  Psychiatric/Behavioral:   Denies mood changes, suicidal or homicidal ideations, hallucinations    Objective:   Blood pressure 128/77, pulse 68, height 5\' 1"  (1.549 m), weight 124 lb (56.2 kg). Body mass index is 23.43 kg/m. General:  Well Developed, well nourished, appropriate for stated age.  Neuro:  Alert and oriented,  extra-ocular muscles intact  HEENT:  Normocephalic, atraumatic, neck supple, no carotid bruits appreciated  Skin:  no gross rash, warm, pink. Cardiac:  RRR, S1 S2 Respiratory:  ECTA B/L and A/P, Not using accessory muscles, speaking in full sentences- unlabored. Vascular:  Ext warm, no cyanosis apprec.; cap RF less 2 sec. Psych:  No HI/SI, judgement and insight good, Euthymic mood. Full Affect. Wrist: Inspection normal with no visible erythema or swelling.  ROM smooth without crepitus, with good flexion and extension and ulnar/radial deviation that is symmetrical with opposite wrist.  No snuffbox tenderness.  Strength 5/5 in all directions with some pain.  Negative Finkelstein  POSITIVE tinel's and phalens.

## 2017-03-25 NOTE — Assessment & Plan Note (Signed)
Discussed with patient she will continue to try to modify her activities during the day  Continue Mobic or other NSAID  Use night braces which I printed off from [employer] of ones she should buy and she will use these every night  Since pain is pretty bad we will start her on Elavil at night for the neuropathic pain and numbness and helping her sleep.  If she cannot modify her activity and if these conservative methods do not work, she knows we can always send her to sports med or orthopedics for an injection in the wrist and/or physical therapy.    And then finally we may need surgery and tendon releases

## 2017-03-25 NOTE — Patient Instructions (Signed)
Please see the handouts that I gave you from the [employer] of orthopedic surgeons on carpal tunnel, as well as the [employer] that I printed of braces you could buy.  Usual braces every night.  Try to modify activity level especially repetitive motions  Use Mobic/ meloxicam or other NSAIDs such as Advil or Aleve etc as needed for bad pain  Try the amitriptyline\Elavil for at night to help with the neuropathic pain  (numbness tingling and shooting pains)  We'll see you back in 3 months if not improved we can consider physical therapy and possibly sending you to orthopedics or sports med for injections,

## 2017-03-25 NOTE — Assessment & Plan Note (Signed)
Medicines refilled-vaginal tablets.  Patient understands need for every 3-5 year Pap smear depending on HPV status

## 2017-03-25 NOTE — Assessment & Plan Note (Signed)
Refilled meds given, tolerating well without complaints.  Reminded patient of necessary dietary and lifestyle modifications

## 2017-03-25 NOTE — Assessment & Plan Note (Signed)
Patient again declined rheumatology referral and/or physical therapy.  She will continue her mobile and other treatments we discussed today and will follow-up if her RA symptoms worsen

## 2017-03-25 NOTE — Assessment & Plan Note (Signed)
Continue ice cup massage which is working well for you as well as your massage machine at home.  Let me know if this flares again.

## 2017-06-09 ENCOUNTER — Telehealth: Payer: Self-pay | Admitting: Family Medicine

## 2017-06-09 NOTE — Telephone Encounter (Signed)
Patient's insurance is now requiring her to get a 90 day supply of meds through [facility] for her insurance to cover it. She needs a refill on the meloxicam, estradiol, and simvastatin [facility] to [facility] in [place].

## 2017-06-10 NOTE — Telephone Encounter (Signed)
Reviewed chart all medications were written for 90 days.  Refill for Meloxicam (last filled 01/22/17 for 90 days with 1 refill)  should be due 07/25/2017. Esteradiol (last filled 03/25/2017 for 90 days with 2 refills) should still have 2 refills on medication. And Simvastatin (last filled 03/25/17 for 90 days with 1 refills) should have 1 refill for 90 days on medication. Called patient to clarify medications and what is needed at this time.  Left message for patient to call the office. MPulliam, CMA/RT(R)

## 2017-06-15 NOTE — Telephone Encounter (Signed)
Called left message for patient to call the office. MPulliam, CMA/RT(R)  

## 2017-06-17 ENCOUNTER — Other Ambulatory Visit: Payer: Self-pay | Admitting: Family Medicine

## 2017-06-17 DIAGNOSIS — M069 Rheumatoid arthritis, unspecified: Secondary | ICD-10-CM

## 2017-06-18 NOTE — Telephone Encounter (Signed)
Called patient left message for patient to call the office. MPulliam, CMA/RT(R)  

## 2017-07-01 ENCOUNTER — Ambulatory Visit: Payer: BLUE CROSS/BLUE SHIELD | Admitting: Family Medicine

## 2017-07-08 ENCOUNTER — Encounter: Payer: Self-pay | Admitting: Family Medicine

## 2017-07-08 ENCOUNTER — Ambulatory Visit: Payer: BLUE CROSS/BLUE SHIELD | Admitting: Family Medicine

## 2017-07-08 VITALS — BP 121/85 | HR 72 | Ht 61.0 in | Wt 129.2 lb

## 2017-07-08 DIAGNOSIS — M8589 Other specified disorders of bone density and structure, multiple sites: Secondary | ICD-10-CM | POA: Diagnosis not present

## 2017-07-08 DIAGNOSIS — E782 Mixed hyperlipidemia: Secondary | ICD-10-CM

## 2017-07-08 DIAGNOSIS — N952 Postmenopausal atrophic vaginitis: Secondary | ICD-10-CM | POA: Diagnosis not present

## 2017-07-08 DIAGNOSIS — Z23 Encounter for immunization: Secondary | ICD-10-CM | POA: Diagnosis not present

## 2017-07-08 DIAGNOSIS — G5603 Carpal tunnel syndrome, bilateral upper limbs: Secondary | ICD-10-CM | POA: Diagnosis not present

## 2017-07-08 DIAGNOSIS — N951 Menopausal and female climacteric states: Secondary | ICD-10-CM | POA: Diagnosis not present

## 2017-07-08 DIAGNOSIS — Z1239 Encounter for other screening for malignant neoplasm of breast: Secondary | ICD-10-CM

## 2017-07-08 DIAGNOSIS — M069 Rheumatoid arthritis, unspecified: Secondary | ICD-10-CM | POA: Diagnosis not present

## 2017-07-08 DIAGNOSIS — E348 Other specified endocrine disorders: Secondary | ICD-10-CM

## 2017-07-08 DIAGNOSIS — Z1231 Encounter for screening mammogram for malignant neoplasm of breast: Secondary | ICD-10-CM

## 2017-07-08 MED ORDER — ESTRADIOL 10 MCG VA TABS: ORAL_TABLET | VAGINAL | 3 refills | Status: DC

## 2017-07-08 MED ORDER — MELOXICAM 15 MG PO TABS: 15.0000 mg | ORAL_TABLET | Freq: Every day | ORAL | 1 refills | Status: DC

## 2017-07-08 MED ORDER — SIMVASTATIN 10 MG PO TABS: 10.0000 mg | ORAL_TABLET | Freq: Every day | ORAL | 1 refills | Status: DC

## 2017-07-08 NOTE — Patient Instructions (Addendum)
Please make a appointment for your yearly physical including Pap smear in early to mid January.  Please come fasting so we can get blood work.  -We will put in a referral for a mammogram as well as bone density for you today.  If you have not heard anything from anyone by 1 week please call Dorothea Ogle our front desk who is our referral clerk.

## 2017-07-08 NOTE — Progress Notes (Signed)
Impression and Recommendations:    1. Osteopenia of multiple sites   2. Mixed hyperlipidemia   3. Rheumatoid arthritis involving both hands, unspecified rheumatoid factor presence (HCC)   4. Carpal tunnel syndrome, bilateral   5. Flu vaccine need   6. Estradiol deficiency   7. Screening for breast cancer   8. Vaginal atrophy   9. Vaginal dryness, menopausal   10. Need for Tdap vaccination    Please make a appointment for your yearly physical including Pap smear in early to mid January.  Please come fasting so we can get blood work.  -We will put in a referral for a mammogram as well as bone density for you today.  If you have not heard anything from anyone by 1 week please call Dorothea Ogle our front desk who is our referral clerk.    Education and routine counseling performed. Handouts provided.  Orders Placed This Encounter  Procedures  . DG Bone Density  . MM SCREENING BREAST TOMO BILATERAL  . Flu Vaccine QUAD 6+ mos PF IM (Fluarix Quad PF)  . Tdap vaccine greater than or equal to 7yo IM   Meds ordered this encounter  Medications  . meloxicam (MOBIC) 15 MG tablet    Sig: Take 1 tablet (15 mg total) daily by mouth.    Dispense:  90 tablet    Refill:  1  . simvastatin (ZOCOR) 10 MG tablet    Sig: Take 1 tablet (10 mg total) at bedtime by mouth. Patient must have office visit before any further refills.    Dispense:  90 tablet    Refill:  1  . Estradiol (YUVAFEM) 10 MCG TABS vaginal tablet    Sig: Insert 1 TABLET TWICE A WEEK into vagina    Dispense:  24 tablet    Refill:  3    Gross side effects, risk and benefits, and alternatives of medications and treatment plan in general discussed with patient.  Patient is aware that all medications have potential side effects and we are unable to predict every side effect or drug-drug interaction that may occur.   Patient will call with any questions prior to using medication if they have concerns.  Expresses verbal understanding  and consents to current therapy and treatment regimen.  No barriers to understanding were identified.  Red flag symptoms and signs discussed in detail.  Patient expressed understanding regarding what to do in case of emergency\urgent symptoms  Please see AVS handed out to patient at the end of our visit for further patient instructions/ counseling done pertaining to today's office visit.   Return in about 8 weeks (around 09/02/2017) for CPE/ yrly physical, come fasting so we can get bldwrk then as well.     Note: This document was prepared using Dragon voice recognition software and may include unintentional dictation errors.  Mellody Dance 7:30 PM --------------------------------------------------------------------------------------------------------------------------------------------------------------------------------------------------------------------------------------------    Subjective:    CC:  Chief Complaint  Patient presents with  . Follow-up    HPI: Rachael Clark is a 56 y.o. female who presents to [place] [facility] today for follow-up of her carpal tunnel symptoms which she had bilaterally.  We told her to use braces every night, modify her activities especially repetitive motions.  She was told to use NSAIDs and to try Elavil at night to help with the neuropathic pain.  She was told to follow-up in 3 months and that is what she is here for  today.  Taking meloxicam- and using braces every night- feels great.  No more pain really.   Patient states she is at least 80% decrease in her pain amount from prior. Still active at work, but trying to modify    Wt Readings from Last 3 Encounters:  09/03/17 127 lb (57.6 kg)  07/08/17 129 lb 3.2 oz (58.6 kg)  03/25/17 124 lb (56.2 kg)   BP Readings from Last 3 Encounters:  09/03/17 117/70  07/08/17 121/85  03/25/17 128/77   Pulse Readings from Last 3 Encounters:  09/03/17 79  07/08/17 72    03/25/17 68   BMI Readings from Last 3 Encounters:  09/03/17 24.00 kg/m  07/08/17 24.41 kg/m  03/25/17 23.43 kg/m     Patient Care Team    Relationship Specialty Notifications Start End  Mellody Dance, DO PCP - General Family Medicine  08/27/16      Patient Active Problem List   Diagnosis Date Noted  . 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 Dense breast tissue 09/03/2017  . Osteopenia after menopause 09/03/2017  . Family history of malignant neoplasm of uterus 09/03/2017  . History of ovarian cyst 09/03/2017  . Carpal tunnel syndrome, bilateral 03/25/2017  . Vaginal dryness, menopausal 03/25/2017  . Rheumatoid arthritis involving both hands ([place]) 01/22/2017  . Chronic left shoulder pain 01/22/2017  . Chronic shoulder bursitis, left 01/22/2017  . Muscle strain 01/22/2017  . Encounter for screening mammogram for breast cancer 01/22/2017  . Benign breast lumps- B/L FNA's - both Benign 08/27/2016  . Osteopenia-  q 2 yrs bone scan-  last one was bone density 2015 08/27/2016  . 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 h/o Hyperlipidemia 08/27/2016  . Vaginal atrophy 08/27/2016    Past Medical history, Surgical history, Family history, Social history, Allergies and Medications have been entered into the medical record, reviewed and changed as needed.    Current Meds  Medication Sig  . amitriptyline (ELAVIL) 25 MG tablet take 1-2 tablets at bedtime prn pain/ numbness  . Estradiol (YUVAFEM) 10 MCG TABS vaginal tablet Insert 1 TABLET TWICE A WEEK into vagina  . meloxicam (MOBIC) 15 MG tablet Take 1 tablet (15 mg total) daily by mouth.  . simvastatin (ZOCOR) 10 MG tablet Take 1 tablet (10 mg total) at bedtime by mouth. Patient must have office visit before any further refills.  . [DISCONTINUED] Estradiol (YUVAFEM) 10 MCG TABS vaginal tablet Insert 1 TABLET TWICE A WEEK into vagina  . [DISCONTINUED] meloxicam (MOBIC) 15 MG tablet Take 1 tablet (15 mg total) by mouth daily.  .  [DISCONTINUED] simvastatin (ZOCOR) 10 MG tablet Take 1 tablet (10 mg total) by mouth at bedtime. Patient must have office visit before any further refills.    Allergies:  No Known Allergies   Review of Systems: General:   Denies fever, chills, unexplained weight loss.  Optho/Auditory:   Denies visual changes, blurred vision/LOV Respiratory:   Denies wheeze, DOE more than baseline levels.  Cardiovascular:   Denies chest pain, palpitations, new onset peripheral edema  Gastrointestinal:   Denies nausea, vomiting, diarrhea, abd pain.  Genitourinary: Denies dysuria, freq/ urgency, flank pain or discharge from genitals.  Endocrine:     Denies hot or cold intolerance, polyuria, polydipsia. Musculoskeletal:   Denies unexplained myalgias, joint swelling, unexplained arthralgias, gait problems.  Skin:  Denies new onset rash, suspicious lesions Neurological:     Denies dizziness, unexplained weakness, numbness  Psychiatric/Behavioral:   Denies mood changes, suicidal or homicidal ideations, hallucinations  Objective:   Blood pressure 121/85, pulse 72, height 5\' 1"  (1.549 m), weight 129 lb 3.2 oz (58.6 kg). Body mass index is 24.41 kg/m.   General: Well Developed, well nourished, and in no acute distress.  HEENT: Normocephalic, atraumatic Skin: Warm and dry, cap RF less 2 sec, good turgor Chest:  Normal excursion, shape, no gross abn Respiratory: speaking in full sentences, no conversational dyspnea NeuroM-Sk: Ambulates w/o assistance, moves * 4 Psych: A and O *3, insight good, mood-full

## 2017-08-13 ENCOUNTER — Ambulatory Visit
Admission: RE | Admit: 2017-08-13 | Discharge: 2017-08-13 | Disposition: A | Discharge status: Home / Self Care | Payer: BLUE CROSS/BLUE SHIELD | Source: Ambulatory Visit | Attending: Family Medicine | Admitting: Family Medicine

## 2017-08-13 DIAGNOSIS — M8589 Other specified disorders of bone density and structure, multiple sites: Secondary | ICD-10-CM | POA: Diagnosis not present

## 2017-08-13 DIAGNOSIS — Z1231 Encounter for screening mammogram for malignant neoplasm of breast: Secondary | ICD-10-CM | POA: Diagnosis not present

## 2017-08-13 DIAGNOSIS — Z780 Asymptomatic menopausal state: Secondary | ICD-10-CM | POA: Diagnosis not present

## 2017-08-13 DIAGNOSIS — E348 Other specified endocrine disorders: Secondary | ICD-10-CM

## 2017-08-13 DIAGNOSIS — Z1239 Encounter for other screening for malignant neoplasm of breast: Secondary | ICD-10-CM

## 2017-09-03 ENCOUNTER — Encounter: Payer: Self-pay | Admitting: Family Medicine

## 2017-09-03 ENCOUNTER — Other Ambulatory Visit (INDEPENDENT_AMBULATORY_CARE_PROVIDER_SITE_OTHER): Payer: BLUE CROSS/BLUE SHIELD

## 2017-09-03 ENCOUNTER — Ambulatory Visit: Payer: BLUE CROSS/BLUE SHIELD | Admitting: Family Medicine

## 2017-09-03 VITALS — BP 117/70 | HR 79 | Ht 61.0 in | Wt 127.0 lb

## 2017-09-03 DIAGNOSIS — H538 Other visual disturbances: Secondary | ICD-10-CM | POA: Diagnosis not present

## 2017-09-03 DIAGNOSIS — Z8742 Personal history of other diseases of the female genital tract: Secondary | ICD-10-CM | POA: Insufficient documentation

## 2017-09-03 DIAGNOSIS — N63 Unspecified lump in unspecified breast: Secondary | ICD-10-CM | POA: Diagnosis not present

## 2017-09-03 DIAGNOSIS — M810 Age-related osteoporosis without current pathological fracture: Secondary | ICD-10-CM | POA: Diagnosis not present

## 2017-09-03 DIAGNOSIS — M069 Rheumatoid arthritis, unspecified: Secondary | ICD-10-CM | POA: Diagnosis not present

## 2017-09-03 DIAGNOSIS — E782 Mixed hyperlipidemia: Secondary | ICD-10-CM

## 2017-09-03 DIAGNOSIS — R9230 Dense breasts, unspecified: Secondary | ICD-10-CM | POA: Insufficient documentation

## 2017-09-03 DIAGNOSIS — N200 Calculus of kidney: Secondary | ICD-10-CM | POA: Diagnosis not present

## 2017-09-03 DIAGNOSIS — M8580 Other specified disorders of bone density and structure, unspecified site: Secondary | ICD-10-CM

## 2017-09-03 DIAGNOSIS — Z780 Asymptomatic menopausal state: Secondary | ICD-10-CM

## 2017-09-03 DIAGNOSIS — M8589 Other specified disorders of bone density and structure, multiple sites: Secondary | ICD-10-CM

## 2017-09-03 DIAGNOSIS — Z8049 Family history of malignant neoplasm of other genital organs: Secondary | ICD-10-CM | POA: Insufficient documentation

## 2017-09-03 DIAGNOSIS — R922 Inconclusive mammogram: Secondary | ICD-10-CM

## 2017-09-03 DIAGNOSIS — Z0001 Encounter for general adult medical examination with abnormal findings: Secondary | ICD-10-CM

## 2017-09-03 DIAGNOSIS — Z124 Encounter for screening for malignant neoplasm of cervix: Secondary | ICD-10-CM

## 2017-09-03 NOTE — Progress Notes (Signed)
Impression and Recommendations:    1. Screening for cervical cancer   2. Encounter for general adult medical examination with abnormal findings   3. Osteopenia after menopause   4. Dense breast tissue   5. Family history of malignant neoplasm of uterus   6. History of ovarian cyst   7. Blurred vision, bilateral   8. Benign breast lumps- B/L FNA's - both Benign   9. Osteopenia of multiple sites   10. s/p Calcium nephrolithiasis    Osteopenia tx:  Educated pt on results and need for txmnt.   Please take 5000 IUs vitamin D3 over-the-counter daily as well as 01-1199 mg elemental calcium over-the-counter supplement daily for your osteopenia.  Make sure you are engaging in weightbearing exercises to a goal of 30 minutes daily.  This is beyond any regular activity or exercise for work you do.  You will need a repeat bone density evaluation in 2 years.  - I asked my CMA to please obtain patient's medical records regarding her colonoscopy, GYN\Pap smears from her prior providers and other health maintenance medical history that needs to be updated. Also to- Please give patient Hemoccult cards for home use and please instruct patient on how to use them.  Blurred vision:  No overt ABN appreciated.  Appears to be due to a lack of proper eye eval as she aged.  We put in referral to an eye doctor for you per your request as well as a gynecologist for your future female care /Pap smears etc.  After discussion, pt prefers GYN for f/up with inc risk due to family hx  Please check with your insurance company and see if they cover the Shingrix (Shingles) vaccine.  She was asked to call us and make nurse-only OV if they cover it.    Gross side effects, risk and benefits, and alternatives of medications discussed with patient.  Patient is aware that all medications have potential side effects and we are unable to predict every side effect or drug-drug interaction that may occur.  Expresses verbal understanding  and consents to current therapy plan and treatment regiment.  1) Anticipatory Guidance: Discussed importance of wearing a seatbelt while driving, not texting while driving; sunscreen when outside along with yearly skin surveillance; eating a well balanced and modest diet; physical activity at least 25 minutes per day or 150 min/ week of moderate to intense activity.  2) Immunizations / Screenings / Labs:  All immunizations and screenings that patient agrees to, are up-to-date per recommendations or will be updated today.  Patient understands the needs for q 58mo dental and yearly vision screens which pt will schedule independently. Obtain CBC, CMP, HgA1c, Lipid panel, TSH and vit D when fasting if not already done recently.   3) Weight:   -  Improve nutrient density of diet through increasing intake of fruits and vegetables and decreasing saturated/trans fats, white flour products and refined sugar products.   F-up preventative CPE in 1 year. F/up sooner for chronic care management as discussed and/or prn.  -Vitamin D deficiency:  Pt instructed to take 5000 IUs of Vitamin D.  -Osteopenia: Pt instructed to take 1200 mg of calcium and 5000 IUs of vitamin D. She was  She is afraid to take calcium because of a recent kidney stone diagnosis.   -Dense breasts: Pt instructed to get routine yearly mammograms. No abn on PE  - FMHx of cervical cancer and ?able other female CA's: Referred to gynecologist.   -Blurred  vision: Pt referred to optometrist or ophthalmologist for yearly eye exam.   Please give patient Hemoccult cards for home use and please instruct patient on how to use them.  -Follow up in 4-6 months for routine follow up for chronic care. If labs are abnormal, pt will follow up in 1-2 weeks.    Orders Placed This Encounter  Procedures  . Ambulatory referral to Ophthalmology  . Ambulatory referral to Gynecology    Please see orders placed and AVS handed out to patient at the end  of our visit for further patient instructions/ counseling done pertaining to today's office visit.   This document was prepared by Ileene Patrick, Medical transcriptionist.  I have reviewed medical documentation per the transcriptionist for accuracy and concur.  Rachael Clark 09/05/17 3:39 PM     Subjective:    Chief Complaint  Patient presents with  . Annual Exam   CC: Annual exam  HPI: Rachael Clark is a 57 y.o. female who presents to [place] [facility] today a yearly health maintenance exam. Pt denies any associated symptoms. Pt has not had any new sexual partners.   FMHx: Pt has a FMHx of CA in her uterus (late onset, age 27).   PMHx: of hysterectomy and ovarian cysts.  Eye: Pt's last eye exam was 2 years ago and she reports having changes to her vision.   Dental: Pt has not seen a dentist recently and reports not having dental insurance.   GI: Her last colonoscopy was at age 73 and she will follow up with GI doc at age 56  Health Maintenance Summary Reviewed and updated, unless pt declines services.  Colonoscopy:     (Unnecessary secondary to < 43 or > 36 years old.) Tobacco History Reviewed:   Y  CT scan for screening lung CA:   n/a Abdominal Ultrasound:     ( Unnecessary secondary to < 89 or > 41 years old) Alcohol:    No concerns, no excessive use Exercise Habits:   Not meeting AHA guidelines STD concerns:   none Drug Use:   None Birth control method:   n/a Menses regular:     n/a Lumps or breast concerns:      no Breast Cancer Family History:      n/a Bone/ DEXA scan:    Pt has osteopenia but has not been taking her calcium or vitamin D supplements.     Immunization History  Administered Date(s) Administered  . Influenza,inj,Quad PF,6+ Mos 07/08/2017  . Tdap 07/08/2017    Health Maintenance  Topic Date Due  . Hepatitis C Screening  03/25/2029 (Originally 29-Sep-1960)  . HIV Screening  03/25/2029 (Originally  [birth year]/25/1976)  . PAP SMEAR  08/01/2018  . MAMMOGRAM  08/14/2019  . DEXA SCAN  08/14/2019  . COLONOSCOPY  08/02/2023  . TETANUS/TDAP  07/09/2027  . INFLUENZA VACCINE  Completed     Wt Readings from Last 3 Encounters:  09/03/17 127 lb (57.6 kg)  07/08/17 129 lb 3.2 oz (58.6 kg)  03/25/17 124 lb (56.2 kg)   BP Readings from Last 3 Encounters:  09/03/17 117/70  07/08/17 121/85  03/25/17 128/77   Pulse Readings from Last 3 Encounters:  09/03/17 79  07/08/17 72  03/25/17 68     Past Medical History:  Diagnosis Date  . Benign breast lumps   . Hyperlipidemia   . Kidney stone   . Osteopenia       Past Surgical  History:  Procedure Laterality Date  . ABDOMINAL HYSTERECTOMY    . APPENDECTOMY    . BREAST BIOPSY Bilateral   . BREAST LUMPECTOMY Bilateral   . CHOLECYSTECTOMY    . OVARIAN CYST REMOVAL    . TONSILLECTOMY    . WRIST SURGERY Right       Family History  Problem Relation Age of Onset  . Osteoporosis Mother   . Dementia Mother   . Healthy Father   . Healthy Sister   . Healthy Brother   . Healthy Daughter   . Healthy Son   . Healthy Sister   . Healthy Sister   . Healthy Brother   . Healthy Son       Social History   Substance and Sexual Activity  Drug Use No  ,   Social History   Substance and Sexual Activity  Alcohol Use Yes  . Alcohol/week: 2.4 oz  . Types: 4 Glasses of wine per week  ,   Social History   Tobacco Use  Smoking Status Former Smoker  . Packs/day: 0.25  . Years: 4.00  . Pack years: 1.00  . Last attempt to quit: 05/02/1996  . Years since quitting: 21.3  Smokeless Tobacco Never Used  ,   Social History   Substance and Sexual Activity  Sexual Activity Yes  . Birth control/protection: Surgical    Current Outpatient Medications on File Prior to Visit  Medication Sig Dispense Refill  . amitriptyline (ELAVIL) 25 MG tablet take 1-2 tablets at bedtime prn pain/ numbness 180 tablet 0  . Estradiol (YUVAFEM) 10 MCG TABS  vaginal tablet Insert 1 TABLET TWICE A WEEK into vagina 24 tablet 3  . meloxicam (MOBIC) 15 MG tablet Take 1 tablet (15 mg total) daily by mouth. 90 tablet 1  . simvastatin (ZOCOR) 10 MG tablet Take 1 tablet (10 mg total) at bedtime by mouth. Patient must have office visit before any further refills. 90 tablet 1   No current facility-administered medications on file prior to visit.     Allergies: Patient has no known allergies.  Review of Systems: General:   Denies fever, chills, unexplained weight loss.  Optho/Auditory:   Pt reports visual changes Respiratory:   Denies SOB, DOE more than baseline levels.  Cardiovascular:   Denies chest pain, palpitations, new onset peripheral edema  Gastrointestinal:   Denies nausea, vomiting, diarrhea.  Genitourinary: Denies dysuria, freq/ urgency, flank pain or discharge from genitals.  Endocrine:     Denies hot or cold intolerance, polyuria, polydipsia. Musculoskeletal:   Denies unexplained myalgias, joint swelling, unexplained arthralgias, gait problems.  Skin:  Denies rash, suspicious lesions Neurological:     Denies dizziness, unexplained weakness, numbness  Psychiatric/Behavioral:   Denies mood changes, suicidal or homicidal ideations, hallucinations    Objective:    Blood pressure 117/70, pulse 79, height 5\' 1"  (1.549 m), weight 127 lb (57.6 kg), SpO2 98 %. Body mass index is 24 kg/m. General Appearance:    Alert, cooperative, no distress, appears stated age  Head:    Normocephalic, without obvious abnormality, atraumatic  Eyes:    PERRL, conjunctiva/corneas clear, EOM's intact, fundi    benign, both eyes  Ears:    Normal TM's and external ear canals, both ears  Nose:   Nares normal, septum midline, mucosa normal, no drainage    or sinus tenderness  Throat:   Lips w/o lesion, mucosa moist, and tongue normal; teeth and   gums normal  Neck:   Supple, symmetrical, trachea  midline, no adenopathy;    thyroid:  no  enlargement/tenderness/nodules; no carotid   bruit or JVD  Back:     Symmetric, no curvature, ROM normal, no CVA tenderness  Lungs:     Clear to auscultation bilaterally, respirations unlabored, no       Wh/ R/ R  Chest Wall:    No tenderness or gross deformity; normal excursion   Heart:    Regular rate and rhythm, S1 and S2 normal, no murmur, rub   or gallop  Breast Exam:    No tenderness, masses, or nipple abnormality b/l; no d/c  Abdomen:     Soft, non tender, bowel sounds active all four quadrants, NO   G/R/R, no masses, no organomegaly.   Genitalia:    Deferred to gyn  Rectal:   deferred  Extremities:   Extremities normal, atraumatic, no cyanosis or gross edema  Pulses:   2+ and symmetric all extremities  Skin:   Warm, dry, Skin color, texture, turgor normal, no obvious rashes or lesions  Psych: No HI/SI, judgement and insight good, Euthymic mood. Full Affect.  Neurologic:   CNII-XII intact, normal strength, sensation and reflexes    Throughout

## 2017-09-03 NOTE — Patient Instructions (Addendum)
Please take 5000 IUs vitamin D3 over-the-counter daily as well as [phone] mg elemental calcium over-the-counter supplement daily for your osteopenia.  Make sure you are engaging in weightbearing exercises to a goal of 30 minutes daily.  This is beyond any regular activity or exercise for work you do.  You will need a repeat bone density evaluation in 2 years.  Please give patient Hemoccult cards for home use and please instruct patient on how to use them.  Please obtain patient's medical records regarding her colonoscopy, GYN\Pap smears from her prior providers and other health maintenance medical history that needs to be updated.  We put in referral to an eye doctor for you per your request as well as a gynecologist for your future female care Pap smears etc.  Please check with your insurance company and see if they cover the Shingrix (Shingles) vaccine.     Preventive Care for Adults, Female  A healthy lifestyle and preventive care can promote health and wellness. Preventive health guidelines for women include the following key practices.   A routine yearly physical is a good way to check with your health care provider about your health and preventive screening. It is a chance to share any concerns and updates on your health and to receive a thorough exam.   Visit your dentist for a routine exam and preventive care every 6 months. Brush your teeth twice a day and floss once a day. Good oral hygiene prevents tooth decay and gum disease.   The frequency of eye exams is based on your age, health, family medical history, use of contact lenses, and other factors. Follow your health care provider's recommendations for frequency of eye exams.   Eat a healthy diet. Foods like vegetables, fruits, whole grains, low-fat dairy products, and lean protein foods contain the nutrients you need without too many calories. Decrease your intake of foods high in solid fats, added sugars, and salt. Eat the  right amount of calories for you.Get information about a proper diet from your health care provider, if necessary.   Regular physical exercise is one of the most important things you can do for your health. Most adults should get at least 150 minutes of moderate-intensity exercise (any activity that increases your heart rate and causes you to sweat) each week. In addition, most adults need muscle-strengthening exercises on 2 or more days a week.   Maintain a healthy weight. The body mass index (BMI) is a screening tool to identify possible weight problems. It provides an estimate of body fat based on height and weight. Your health care provider can find your BMI, and can help you achieve or maintain a healthy weight.For adults 20 years and older:   - A BMI below 18.5 is considered underweight.   - A BMI of 18.5 to 24.9 is normal.   - A BMI of 25 to 29.9 is considered overweight.   - A BMI of 30 and above is considered obese.   Maintain normal blood lipids and cholesterol levels by exercising and minimizing your intake of trans and saturated fats.  Eat a balanced diet with plenty of fruit and vegetables. Blood tests for lipids and cholesterol should begin at age 45 and be repeated every 5 years minimum.  If your lipid or cholesterol levels are high, you are over 40, or you are at high risk for heart disease, you may need your cholesterol levels checked more frequently.Ongoing high lipid and cholesterol levels should be treated with medicines  if diet and exercise are not working.   If you smoke, find out from your health care provider how to quit. If you do not use tobacco, do not start.   Lung cancer screening is recommended for adults aged 106-80 years who are at high risk for developing lung cancer because of a history of smoking. A yearly low-dose CT scan of the lungs is recommended for people who have at least a 30-pack-year history of smoking and are a current smoker or have quit within the  past 15 years. A pack year of smoking is smoking an average of 1 pack of cigarettes a day for 1 year (for example: 1 pack a day for 30 years or 2 packs a day for 15 years). Yearly screening should continue until the smoker has stopped smoking for at least 15 years. Yearly screening should be stopped for people who develop a health problem that would prevent them from having lung cancer treatment.   If you are pregnant, do not drink alcohol. If you are breastfeeding, be very cautious about drinking alcohol. If you are not pregnant and choose to drink alcohol, do not have more than 1 drink per day. One drink is considered to be 12 ounces (355 mL) of beer, 5 ounces (148 mL) of wine, or 1.5 ounces (44 mL) of liquor.   Avoid use of street drugs. Do not share needles with anyone. Ask for help if you need support or instructions about stopping the use of drugs.   High blood pressure causes heart disease and increases the risk of stroke. Your blood pressure should be checked at least yearly.  Ongoing high blood pressure should be treated with medicines if weight loss and exercise do not work.   If you are 88-69 years old, ask your health care provider if you should take aspirin to prevent strokes.   Diabetes screening involves taking a blood sample to check your fasting blood sugar level. This should be done once every 3 years, after age 63, if you are within normal weight and without risk factors for diabetes. Testing should be considered at a younger age or be carried out more frequently if you are overweight and have at least 1 risk factor for diabetes.   Breast cancer screening is essential preventive care for women. You should practice "breast self-awareness."  This means understanding the normal appearance and feel of your breasts and may include breast self-examination.  Any changes detected, no matter how small, should be reported to a health care provider.  Women in their 32s and 30s should have a  clinical breast exam (CBE) by a health care provider as part of a regular health exam every 1 to 3 years.  After age 43, women should have a CBE every year.  Starting at age 33, women should consider having a mammogram (breast X-ray test) every year.  Women who have a family history of breast cancer should talk to their health care provider about genetic screening.  Women at a high risk of breast cancer should talk to their health care providers about having an MRI and a mammogram every year.   -Breast cancer gene (BRCA)-related cancer risk assessment is recommended for women who have family members with BRCA-related cancers. BRCA-related cancers include breast, ovarian, tubal, and peritoneal cancers. Having family members with these cancers may be associated with an increased risk for harmful changes (mutations) in the breast cancer genes BRCA1 and BRCA2. Results of the assessment will determine the need  for genetic counseling and BRCA1 and BRCA2 testing.   The Pap test is a screening test for cervical cancer. A Pap test can show cell changes on the cervix that might become cervical cancer if left untreated. A Pap test is a procedure in which cells are obtained and examined from the lower end of the uterus (cervix).   - Women should have a Pap test starting at age 57.   - Between ages 63 and 16, Pap tests should be repeated every 2 years.   - Beginning at age 66, you should have a Pap test every 3 years as long as the past 3 Pap tests have been normal.   - Some women have medical problems that increase the chance of getting cervical cancer. Talk to your health care provider about these problems. It is especially important to talk to your health care provider if a new problem develops soon after your last Pap test. In these cases, your health care provider may recommend more frequent screening and Pap tests.   - The above recommendations are the same for women who have or have not gotten the vaccine  for human papillomavirus (HPV).   - If you had a hysterectomy for a problem that was not cancer or a condition that could lead to cancer, then you no longer need Pap tests. Even if you no longer need a Pap test, a regular exam is a good idea to make sure no other problems are starting.   - If you are between ages 51 and 34 years, and you have had normal Pap tests going back 10 years, you no longer need Pap tests. Even if you no longer need a Pap test, a regular exam is a good idea to make sure no other problems are starting.   - If you have had past treatment for cervical cancer or a condition that could lead to cancer, you need Pap tests and screening for cancer for at least 20 years after your treatment.   - If Pap tests have been discontinued, risk factors (such as a new sexual partner) need to be reassessed to determine if screening should be resumed.   - The HPV test is an additional test that may be used for cervical cancer screening. The HPV test looks for the virus that can cause the cell changes on the cervix. The cells collected during the Pap test can be tested for HPV. The HPV test could be used to screen women aged 69 years and older, and should be used in women of any age who have unclear Pap test results. After the age of 1, women should have HPV testing at the same frequency as a Pap test.   Colorectal cancer can be detected and often prevented. Most routine colorectal cancer screening begins at the age of 56 years and continues through age 57 years. However, your health care provider may recommend screening at an earlier age if you have risk factors for colon cancer. On a yearly basis, your health care provider may provide home test kits to check for hidden blood in the stool.  Use of a small camera at the end of a tube, to directly examine the colon (sigmoidoscopy or colonoscopy), can detect the earliest forms of colorectal cancer. Talk to your health care provider about this at age  53, when routine screening begins. Direct exam of the colon should be repeated every 5 -10 years through age 71 years, unless early forms of pre-cancerous polyps  or small growths are found.   People who are at an increased risk for hepatitis B should be screened for this virus. You are considered at high risk for hepatitis B if:  -You were born in a country where hepatitis B occurs often. Talk with your health care provider about which countries are considered high risk.  - Your parents were born in a high-risk country and you have not received a shot to protect against hepatitis B (hepatitis B vaccine).  - You have HIV or AIDS.  - You use needles to inject street drugs.  - You live with, or have sex with, someone who has Hepatitis B.  - You get hemodialysis treatment.  - You take certain medicines for conditions like cancer, organ transplantation, and autoimmune conditions.   Hepatitis C blood testing is recommended for all people born from [birth year] through 1965 and any individual with known risks for hepatitis C.   Practice safe sex. Use condoms and avoid high-risk sexual practices to reduce the spread of sexually transmitted infections (STIs). STIs include gonorrhea, chlamydia, syphilis, trichomonas, herpes, HPV, and human immunodeficiency virus (HIV). Herpes, HIV, and HPV are viral illnesses that have no cure. They can result in disability, cancer, and death. Sexually active women aged 60 years and younger should be checked for chlamydia. Older women with new or multiple partners should also be tested for chlamydia. Testing for other STIs is recommended if you are sexually active and at increased risk.   Osteoporosis is a disease in which the bones lose minerals and strength with aging. This can result in serious bone fractures or breaks. The risk of osteoporosis can be identified using a bone density scan. Women ages 80 years and over and women at risk for fractures or osteoporosis should  discuss screening with their health care providers. Ask your health care provider whether you should take a calcium supplement or vitamin D to There are also several preventive steps women can take to avoid osteoporosis and resulting fractures or to keep osteoporosis from worsening. -->Recommendations include:  Eat a balanced diet high in fruits, vegetables, calcium, and vitamins.  Get enough calcium. The recommended total intake of is 1,200 mg daily; for best absorption, if taking supplements, divide doses into 250-500 mg doses throughout the day. Of the two types of calcium, calcium carbonate is best absorbed when taken with food but calcium citrate can be taken on an empty stomach.  Get enough vitamin D. [employer] and [facility] [place] recommend at least 1,000 IU per day for women age 56 and over who are at risk of vitamin D deficiency. Vitamin D deficiency can be caused by inadequate sun exposure (for example, those who live in [place]).  Avoid alcohol and smoking. Heavy alcohol intake (more than 7 drinks per week) increases the risk of falls and hip fracture and women smokers tend to lose bone more rapidly and have lower bone mass than nonsmokers. Stopping smoking is one of the most important changes women can make to improve their health and decrease risk for disease.  Be physically active every day. Weight-bearing exercise (for example, fast walking, hiking, jogging, and weight training) may strengthen bones or slow the rate of bone loss that comes with aging. Balancing and muscle-strengthening exercises can reduce the risk of falling and fracture.  Consider therapeutic medications. Currently, several types of effective drugs are available. Healthcare providers can recommend the type most appropriate for each woman.  Eliminate environmental factors that may contribute to  accidents. Falls cause nearly 90% of all osteoporotic fractures, so reducing this risk is an  important bone-health strategy. Measures include ample lighting, removing obstructions to walking, using nonskid rugs on floors, and placing mats and/or grab bars in showers.  Be aware of medication side effects. Some common medicines make bones weaker. These include a type of steroid drug called glucocorticoids used for arthritis and asthma, some antiseizure drugs, certain sleeping pills, treatments for endometriosis, and some cancer drugs. An overactive thyroid gland or using too much thyroid hormone for an underactive thyroid can also be a problem. If you are taking these medicines, talk to your doctor about what you can do to help protect your bones.reduce the rate of osteoporosis.    Menopause can be associated with physical symptoms and risks. Hormone replacement therapy is available to decrease symptoms and risks. You should talk to your health care provider about whether hormone replacement therapy is right for you.   Use sunscreen. Apply sunscreen liberally and repeatedly throughout the day. You should seek shade when your shadow is shorter than you. Protect yourself by wearing long sleeves, pants, a wide-brimmed hat, and sunglasses year round, whenever you are outdoors.   Once a month, do a whole body skin exam, using a mirror to look at the skin on your back. Tell your health care provider of new moles, moles that have irregular borders, moles that are larger than a pencil eraser, or moles that have changed in shape or color.   -Stay current with required vaccines (immunizations).   Influenza vaccine. All adults should be immunized every year.  Tetanus, diphtheria, and acellular pertussis (Td, Tdap) vaccine. Pregnant women should receive 1 dose of Tdap vaccine during each pregnancy. The dose should be obtained regardless of the length of time since the last dose. Immunization is preferred during the 27th 36th week of gestation. An adult who has not previously received Tdap or who does  not know her vaccine status should receive 1 dose of Tdap. This initial dose should be followed by tetanus and diphtheria toxoids (Td) booster doses every 10 years. Adults with an unknown or incomplete history of completing a 3-dose immunization series with Td-containing vaccines should begin or complete a primary immunization series including a Tdap dose. Adults should receive a Td booster every 10 years.  Varicella vaccine. An adult without evidence of immunity to varicella should receive 2 doses or a second dose if she has previously received 1 dose. Pregnant females who do not have evidence of immunity should receive the first dose after pregnancy. This first dose should be obtained before leaving the health care facility. The second dose should be obtained 4 8 weeks after the first dose.  Human papillomavirus (HPV) vaccine. Females aged 39 26 years who have not received the vaccine previously should obtain the 3-dose series. The vaccine is not recommended for use in pregnant females. However, pregnancy testing is not needed before receiving a dose. If a female is found to be pregnant after receiving a dose, no treatment is needed. In that case, the remaining doses should be delayed until after the pregnancy. Immunization is recommended for any person with an immunocompromised condition through the age of 5 years if she did not get any or all doses earlier. During the 3-dose series, the second dose should be obtained 4 8 weeks after the first dose. The third dose should be obtained 24 weeks after the first dose and 16 weeks after the second dose.  Zoster vaccine.  One dose is recommended for adults aged 24 years or older unless certain conditions are present.  Measles, mumps, and rubella (MMR) vaccine. Adults born before [birth year] generally are considered immune to measles and mumps. Adults born in [birth year] or later should have 1 or more doses of MMR vaccine unless there is a contraindication to the vaccine or  there is laboratory evidence of immunity to each of the three diseases. A routine second dose of MMR vaccine should be obtained at least 28 days after the first dose for students attending postsecondary schools, health care workers, or international travelers. People who received inactivated measles vaccine or an unknown type of measles vaccine during 1963 1967 should receive 2 doses of MMR vaccine. People who received inactivated mumps vaccine or an unknown type of mumps vaccine before 1979 and are at high risk for mumps infection should consider immunization with 2 doses of MMR vaccine. For females of childbearing age, rubella immunity should be determined. If there is no evidence of immunity, females who are not pregnant should be vaccinated. If there is no evidence of immunity, females who are pregnant should delay immunization until after pregnancy. Unvaccinated health care workers born before [birth year] who lack laboratory evidence of measles, mumps, or rubella immunity or laboratory confirmation of disease should consider measles and mumps immunization with 2 doses of MMR vaccine or rubella immunization with 1 dose of MMR vaccine.  Pneumococcal 13-valent conjugate (PCV13) vaccine. When indicated, a person who is uncertain of her immunization history and has no record of immunization should receive the PCV13 vaccine. An adult aged 45 years or older who has certain medical conditions and has not been previously immunized should receive 1 dose of PCV13 vaccine. This PCV13 should be followed with a dose of pneumococcal polysaccharide (PPSV23) vaccine. The PPSV23 vaccine dose should be obtained at least 8 weeks after the dose of PCV13 vaccine. An adult aged 68 years or older who has certain medical conditions and previously received 1 or more doses of PPSV23 vaccine should receive 1 dose of PCV13. The PCV13 vaccine dose should be obtained 1 or more years after the last PPSV23 vaccine dose.  Pneumococcal  polysaccharide (PPSV23) vaccine. When PCV13 is also indicated, PCV13 should be obtained first. All adults aged 66 years and older should be immunized. An adult younger than age 58 years who has certain medical conditions should be immunized. Any person who resides in a nursing home or long-term care facility should be immunized. An adult smoker should be immunized. People with an immunocompromised condition and certain other conditions should receive both PCV13 and PPSV23 vaccines. People with human immunodeficiency virus (HIV) infection should be immunized as soon as possible after diagnosis. Immunization during chemotherapy or radiation therapy should be avoided. Routine use of PPSV23 vaccine is not recommended for American Indians, [place] Natives, or people younger than 65 years unless there are medical conditions that require PPSV23 vaccine. When indicated, people who have unknown immunization and have no record of immunization should receive PPSV23 vaccine. One-time revaccination 5 years after the first dose of PPSV23 is recommended for people aged 46 64 years who have chronic kidney failure, nephrotic syndrome, asplenia, or immunocompromised conditions. People who received 1 2 doses of PPSV23 before age 8 years should receive another dose of PPSV23 vaccine at age 81 years or later if at least 5 years have passed since the previous dose. Doses of PPSV23 are not needed for people immunized with PPSV23 at or after age 44 years.  Meningococcal vaccine. Adults with asplenia or persistent complement component deficiencies should receive 2 doses of quadrivalent meningococcal conjugate (MenACWY-D) vaccine. The doses should be obtained at least 2 months apart. Microbiologists working with certain meningococcal bacteria, [employer] recruits, people at risk during an outbreak, and people who travel to or live in countries with a high rate of meningitis should be immunized. A first-year college student up through age 21  years who is living in a residence hall should receive a dose if she did not receive a dose on or after her 16th birthday. Adults who have certain high-risk conditions should receive one or more doses of vaccine.  Hepatitis A vaccine. Adults who wish to be protected from this disease, have certain high-risk conditions, work with hepatitis A-infected animals, work in hepatitis A research labs, or travel to or work in countries with a high rate of hepatitis A should be immunized. Adults who were previously unvaccinated and who anticipate close contact with an international adoptee during the first 60 days after arrival in the [place] from a country with a high rate of hepatitis A should be immunized.  Hepatitis B vaccine.  Adults who wish to be protected from this disease, have certain high-risk conditions, may be exposed to blood or other infectious body fluids, are household contacts or sex partners of hepatitis B positive people, are clients or workers in certain care facilities, or travel to or work in countries with a high rate of hepatitis B should be immunized.  Haemophilus influenzae type b (Hib) vaccine. A previously unvaccinated person with asplenia or sickle cell disease or having a scheduled splenectomy should receive 1 dose of Hib vaccine. Regardless of previous immunization, a recipient of a hematopoietic stem cell transplant should receive a 3-dose series 6 12 months after her successful transplant. Hib vaccine is not recommended for adults with HIV infection.  Preventive Services / Frequency Ages 28 to 39years  Blood pressure check.** / Every 1 to 2 years.  Lipid and cholesterol check.** / Every 5 years beginning at age 94.  Clinical breast exam.** / Every 3 years for women in their 37s and 44s.  BRCA-related cancer risk assessment.** / For women who have family members with a BRCA-related cancer (breast, ovarian, tubal, or peritoneal cancers).  Pap test.** / Every 2 years from  ages 90 through 33. Every 3 years starting at age 37 through age 52 or 81 with a history of 3 consecutive normal Pap tests.  HPV screening.** / Every 3 years from ages 20 through ages 20 to 37 with a history of 3 consecutive normal Pap tests.  Hepatitis C blood test.** / For any individual with known risks for hepatitis C.  Skin self-exam. / Monthly.  Influenza vaccine. / Every year.  Tetanus, diphtheria, and acellular pertussis (Tdap, Td) vaccine.** / Consult your health care provider. Pregnant women should receive 1 dose of Tdap vaccine during each pregnancy. 1 dose of Td every 10 years.  Varicella vaccine.** / Consult your health care provider. Pregnant females who do not have evidence of immunity should receive the first dose after pregnancy.  HPV vaccine. / 3 doses over 6 months, if 53 and younger. The vaccine is not recommended for use in pregnant females. However, pregnancy testing is not needed before receiving a dose.  Measles, mumps, rubella (MMR) vaccine.** / You need at least 1 dose of MMR if you were born in 1957 or later. You may also need a 2nd dose. For females of childbearing age,  rubella immunity should be determined. If there is no evidence of immunity, females who are not pregnant should be vaccinated. If there is no evidence of immunity, females who are pregnant should delay immunization until after pregnancy.  Pneumococcal 13-valent conjugate (PCV13) vaccine.** / Consult your health care provider.  Pneumococcal polysaccharide (PPSV23) vaccine.** / 1 to 2 doses if you smoke cigarettes or if you have certain conditions.  Meningococcal vaccine.** / 1 dose if you are age 61 to 54 years and a Market researcher living in a residence hall, or have one of several medical conditions, you need to get vaccinated against meningococcal disease. You may also need additional booster doses.  Hepatitis A vaccine.** / Consult your health care provider.  Hepatitis B vaccine.** /  Consult your health care provider.  Haemophilus influenzae type b (Hib) vaccine.** / Consult your health care provider.  Ages 74 to 64years  Blood pressure check.** / Every 1 to 2 years.  Lipid and cholesterol check.** / Every 5 years beginning at age 33 years.  Lung cancer screening. / Every year if you are aged 59 80 years and have a 30-pack-year history of smoking and currently smoke or have quit within the past 15 years. Yearly screening is stopped once you have quit smoking for at least 15 years or develop a health problem that would prevent you from having lung cancer treatment.  Clinical breast exam.** / Every year after age 66 years.  BRCA-related cancer risk assessment.** / For women who have family members with a BRCA-related cancer (breast, ovarian, tubal, or peritoneal cancers).  Mammogram.** / Every year beginning at age 27 years and continuing for as long as you are in good health. Consult with your health care provider.  Pap test.** / Every 3 years starting at age 49 years through age 59 or 3 years with a history of 3 consecutive normal Pap tests.  HPV screening.** / Every 3 years from ages 74 years through ages 68 to 6 years with a history of 3 consecutive normal Pap tests.  Fecal occult blood test (FOBT) of stool. / Every year beginning at age 35 years and continuing until age 35 years. You may not need to do this test if you get a colonoscopy every 10 years.  Flexible sigmoidoscopy or colonoscopy.** / Every 5 years for a flexible sigmoidoscopy or every 10 years for a colonoscopy beginning at age 88 years and continuing until age 66 years.  Hepatitis C blood test.** / For all people born from [birth year] through 1965 and any individual with known risks for hepatitis C.  Skin self-exam. / Monthly.  Influenza vaccine. / Every year.  Tetanus, diphtheria, and acellular pertussis (Tdap/Td) vaccine.** / Consult your health care provider. Pregnant women should receive 1 dose of  Tdap vaccine during each pregnancy. 1 dose of Td every 10 years.  Varicella vaccine.** / Consult your health care provider. Pregnant females who do not have evidence of immunity should receive the first dose after pregnancy.  Zoster vaccine.** / 1 dose for adults aged 27 years or older.  Measles, mumps, rubella (MMR) vaccine.** / You need at least 1 dose of MMR if you were born in 1957 or later. You may also need a 2nd dose. For females of childbearing age, rubella immunity should be determined. If there is no evidence of immunity, females who are not pregnant should be vaccinated. If there is no evidence of immunity, females who are pregnant should delay immunization until after pregnancy.  Pneumococcal  13-valent conjugate (PCV13) vaccine.** / Consult your health care provider.  Pneumococcal polysaccharide (PPSV23) vaccine.** / 1 to 2 doses if you smoke cigarettes or if you have certain conditions.  Meningococcal vaccine.** / Consult your health care provider.  Hepatitis A vaccine.** / Consult your health care provider.  Hepatitis B vaccine.** / Consult your health care provider.  Haemophilus influenzae type b (Hib) vaccine.** / Consult your health care provider.  Ages 56 years and over  Blood pressure check.** / Every 1 to 2 years.  Lipid and cholesterol check.** / Every 5 years beginning at age 73 years.  Lung cancer screening. / Every year if you are aged 7 80 years and have a 30-pack-year history of smoking and currently smoke or have quit within the past 15 years. Yearly screening is stopped once you have quit smoking for at least 15 years or develop a health problem that would prevent you from having lung cancer treatment.  Clinical breast exam.** / Every year after age 28 years.  BRCA-related cancer risk assessment.** / For women who have family members with a BRCA-related cancer (breast, ovarian, tubal, or peritoneal cancers).  Mammogram.** / Every year beginning at age 28  years and continuing for as long as you are in good health. Consult with your health care provider.  Pap test.** / Every 3 years starting at age 76 years through age 8 or 66 years with 3 consecutive normal Pap tests. Testing can be stopped between 65 and 70 years with 3 consecutive normal Pap tests and no abnormal Pap or HPV tests in the past 10 years.  HPV screening.** / Every 3 years from ages 32 years through ages 25 or 88 years with a history of 3 consecutive normal Pap tests. Testing can be stopped between 65 and 70 years with 3 consecutive normal Pap tests and no abnormal Pap or HPV tests in the past 10 years.  Fecal occult blood test (FOBT) of stool. / Every year beginning at age 11 years and continuing until age 57 years. You may not need to do this test if you get a colonoscopy every 10 years.  Flexible sigmoidoscopy or colonoscopy.** / Every 5 years for a flexible sigmoidoscopy or every 10 years for a colonoscopy beginning at age 31 years and continuing until age 21 years.  Hepatitis C blood test.** / For all people born from [birth year] through 1965 and any individual with known risks for hepatitis C.  Osteoporosis screening.** / A one-time screening for women ages 33 years and over and women at risk for fractures or osteoporosis.  Skin self-exam. / Monthly.  Influenza vaccine. / Every year.  Tetanus, diphtheria, and acellular pertussis (Tdap/Td) vaccine.** / 1 dose of Td every 10 years.  Varicella vaccine.** / Consult your health care provider.  Zoster vaccine.** / 1 dose for adults aged 25 years or older.  Pneumococcal 13-valent conjugate (PCV13) vaccine.** / Consult your health care provider.  Pneumococcal polysaccharide (PPSV23) vaccine.** / 1 dose for all adults aged 54 years and older.  Meningococcal vaccine.** / Consult your health care provider.  Hepatitis A vaccine.** / Consult your health care provider.  Hepatitis B vaccine.** / Consult your health care  provider.  Haemophilus influenzae type b (Hib) vaccine.** / Consult your health care provider. ** Family history and personal history of risk and conditions may change your health care provider's recommendations. Document Released: 10/14/2001 Document Revised: 06/08/2013  [facility] Patient Information 2014 [place], [place].   EXERCISE AND DIET:  We recommended that  you start or continue a regular exercise program for good health. Regular exercise means any activity that makes your heart beat faster and makes you sweat.  We recommend exercising at least 30 minutes per day at least 3 days a week, preferably 5.  We also recommend a diet low in fat and sugar / carbohydrates.  Inactivity, poor dietary choices and obesity can cause diabetes, heart attack, stroke, and kidney damage, among others.     ALCOHOL AND SMOKING:  Women should limit their alcohol intake to no more than 7 drinks/beers/glasses of wine (combined, not each!) per week. Moderation of alcohol intake to this level decreases your risk of breast cancer and liver damage.  ( And of course, no recreational drugs are part of a healthy lifestyle.)  Also, you should not be smoking at all or even being exposed to second hand smoke. Most people know smoking can cause cancer, and various heart and lung diseases, but did you know it also contributes to weakening of your bones?  Aging of your skin?  Yellowing of your teeth and nails?   CALCIUM AND VITAMIN D:  Adequate intake of calcium and Vitamin D are recommended.  The recommendations for exact amounts of these supplements seem to change often, but generally speaking 600 mg of calcium (either carbonate or citrate) and 800 units of Vitamin D per day seems prudent. Certain women may benefit from higher intake of Vitamin D.  If you are among these women, your doctor will have told you during your visit.     PAP SMEARS:  Pap smears, to check for cervical cancer or precancers,  have traditionally been  done yearly, although recent scientific advances have shown that most women can have pap smears less often.  However, every woman still should have a physical exam from her gynecologist or primary care physician every year. It will include a breast check, inspection of the vulva and vagina to check for abnormal growths or skin changes, a visual exam of the cervix, and then an exam to evaluate the size and shape of the uterus and ovaries.  And after 57 years of age, a rectal exam is indicated to check for rectal cancers. We will also provide age appropriate advice regarding health maintenance, like when you should have certain vaccines, screening for sexually transmitted diseases, bone density testing, colonoscopy, mammograms, etc.    MAMMOGRAMS:  All women over 22 years old should have a yearly mammogram. Many facilities now offer a "3D" mammogram, which may cost around $50 extra out of pocket. If possible,  we recommend you accept the option to have the 3D mammogram performed.  It both reduces the number of women who will be called back for extra views which then turn out to be normal, and it is better than the routine mammogram at detecting truly abnormal areas.     COLONOSCOPY:  Colonoscopy to screen for colon cancer is recommended for all women at age 53.  We know, you hate the idea of the prep.  We agree, BUT, having colon cancer and not knowing it is worse!!  Colon cancer so often starts as a polyp that can be seen and removed at colonscopy, which can quite literally save your life!  And if your first colonoscopy is normal and you have no family history of colon cancer, most women don't have to have it again for 10 years.  Once every ten years, you can do something that may end up saving your life, right?  We will be happy to help you get it scheduled when you are ready.  Be sure to check your insurance coverage so you understand how much it will cost.  It may be covered as a preventative service at no  cost, but you should check your particular policy.

## 2017-09-04 LAB — COMPREHENSIVE METABOLIC PANEL
A/G RATIO: 1.5 (ref 1.2–2.2)
ALBUMIN: 4.1 g/dL (ref 3.5–5.5)
ALT: 23 IU/L (ref 0–32)
AST: 21 IU/L (ref 0–40)
Alkaline Phosphatase: 58 IU/L (ref 39–117)
BILIRUBIN TOTAL: 1 mg/dL (ref 0.0–1.2)
BUN / CREAT RATIO: 22 (ref 9–23)
BUN: 13 mg/dL (ref 6–24)
CALCIUM: 9.2 mg/dL (ref 8.7–10.2)
CHLORIDE: 107 mmol/L — AB (ref 96–106)
CO2: 23 mmol/L (ref 20–29)
Creatinine, Ser: 0.58 mg/dL (ref 0.57–1.00)
GFR, EST AFRICAN AMERICAN: 119 mL/min/{1.73_m2} (ref >59)
GFR, EST NON AFRICAN AMERICAN: 103 mL/min/{1.73_m2} (ref >59)
GLOBULIN, TOTAL: 2.7 g/dL (ref 1.5–4.5)
Glucose: 99 mg/dL (ref 65–99)
POTASSIUM: 4.5 mmol/L (ref 3.5–5.2)
SODIUM: 143 mmol/L (ref 134–144)
TOTAL PROTEIN: 6.8 g/dL (ref 6.0–8.5)

## 2017-09-04 LAB — CBC WITH DIFFERENTIAL/PLATELET
BASOS ABS: 0 10*3/uL (ref 0.0–0.2)
Basos: 0 %
EOS (ABSOLUTE): 0.3 10*3/uL (ref 0.0–0.4)
Eos: 4 %
HEMATOCRIT: 38.7 % (ref 34.0–46.6)
HEMOGLOBIN: 12.9 g/dL (ref 11.1–15.9)
Immature Grans (Abs): 0 10*3/uL (ref 0.0–0.1)
Immature Granulocytes: 0 %
LYMPHS ABS: 2 10*3/uL (ref 0.7–3.1)
Lymphs: 35 %
MCH: 31.4 pg (ref 26.6–33.0)
MCHC: 33.3 g/dL (ref 31.5–35.7)
MCV: 94 fL (ref 79–97)
MONOCYTES: 8 %
MONOS ABS: 0.5 10*3/uL (ref 0.1–0.9)
NEUTROS ABS: 2.9 10*3/uL (ref 1.4–7.0)
Neutrophils: 53 %
Platelets: 241 10*3/uL (ref 150–379)
RBC: 4.11 x10E6/uL (ref 3.77–5.28)
RDW: 13.4 % (ref 12.3–15.4)
WBC: 5.6 10*3/uL (ref 3.4–10.8)

## 2017-09-04 LAB — TSH: TSH: 1.34 u[IU]/mL (ref 0.450–4.500)

## 2017-09-04 LAB — HEMOGLOBIN A1C
Est. average glucose Bld gHb Est-mCnc: 117 mg/dL
Hgb A1c MFr Bld: 5.7 % — ABNORMAL HIGH (ref 4.8–5.6)

## 2017-09-04 LAB — LIPID PANEL
CHOL/HDL RATIO: 2.1 ratio (ref 0.0–4.4)
Cholesterol, Total: 185 mg/dL (ref 100–199)
HDL: 90 mg/dL (ref >39)
LDL CALC: 84 mg/dL (ref 0–99)
Triglycerides: 54 mg/dL (ref 0–149)
VLDL CHOLESTEROL CAL: 11 mg/dL (ref 5–40)

## 2017-09-04 LAB — VITAMIN D 25 HYDROXY (VIT D DEFICIENCY, FRACTURES): VIT D 25 HYDROXY: 38.2 ng/mL (ref 30.0–100.0)

## 2017-09-07 ENCOUNTER — Other Ambulatory Visit: Payer: Self-pay

## 2017-09-07 DIAGNOSIS — M810 Age-related osteoporosis without current pathological fracture: Secondary | ICD-10-CM

## 2017-09-07 DIAGNOSIS — R922 Inconclusive mammogram: Secondary | ICD-10-CM

## 2017-09-07 DIAGNOSIS — Z8742 Personal history of other diseases of the female genital tract: Secondary | ICD-10-CM

## 2017-09-07 DIAGNOSIS — Z8049 Family history of malignant neoplasm of other genital organs: Secondary | ICD-10-CM

## 2017-09-08 ENCOUNTER — Other Ambulatory Visit (INDEPENDENT_AMBULATORY_CARE_PROVIDER_SITE_OTHER): Payer: BLUE CROSS/BLUE SHIELD

## 2017-09-08 DIAGNOSIS — Z1211 Encounter for screening for malignant neoplasm of colon: Secondary | ICD-10-CM

## 2017-09-08 LAB — IFOBT (OCCULT BLOOD)
IMMUNOLOGICAL FECAL OCCULT BLOOD TEST: NEGATIVE
IMMUNOLOGICAL FECAL OCCULT BLOOD TEST: NEGATIVE
IMMUNOLOGICAL FECAL OCCULT BLOOD TEST: POSITIVE

## 2017-09-10 DIAGNOSIS — E348 Other specified endocrine disorders: Secondary | ICD-10-CM | POA: Insufficient documentation

## 2017-10-08 ENCOUNTER — Encounter: Payer: Self-pay | Admitting: Obstetrics and Gynecology

## 2017-10-08 ENCOUNTER — Ambulatory Visit: Payer: BLUE CROSS/BLUE SHIELD | Admitting: Obstetrics and Gynecology

## 2017-10-08 ENCOUNTER — Other Ambulatory Visit: Payer: Self-pay

## 2017-10-08 VITALS — BP 100/60 | HR 72 | Resp 14 | Ht 61.0 in | Wt 125.0 lb

## 2017-10-08 DIAGNOSIS — N952 Postmenopausal atrophic vaginitis: Secondary | ICD-10-CM

## 2017-10-08 DIAGNOSIS — Z01419 Encounter for gynecological examination (general) (routine) without abnormal findings: Secondary | ICD-10-CM | POA: Diagnosis not present

## 2017-10-08 DIAGNOSIS — T83711A Erosion of implanted vaginal mesh and other prosthetic materials to surrounding organ or tissue, initial encounter: Secondary | ICD-10-CM | POA: Diagnosis not present

## 2017-10-08 LAB — POCT URINALYSIS DIPSTICK
Appearance: NORMAL
Bilirubin, UA: NEGATIVE
GLUCOSE UA: NEGATIVE
Ketones, UA: NEGATIVE
LEUKOCYTES UA: NEGATIVE
Nitrite, UA: NEGATIVE
PH UA: 5 (ref 5.0–8.0)
Protein, UA: NEGATIVE
UROBILINOGEN UA: NEGATIVE U/dL — AB

## 2017-10-08 NOTE — Progress Notes (Addendum)
57 y.o. [id number] DivorcedCaucasianF here for annual exam.  H/O TVH in 2007, still has her tubes and ovaries. Never had vasomotor symptoms. Sexually active, same partner x 2 years. She is using vaginal estrogen which helps, occasionally uses lubricant. No dyspareunia.     No LMP recorded. Patient has had a hysterectomy.          Sexually active: Yes.    The current method of family planning is status post hysterectomy for bleeding.  Exercising: No.  The patient does not participate in regular exercise at present. Smoker:  Former smoker  Health Maintenance: Pap:  08/2015 WNL per patient- [place] History of abnormal Pap:  no MMG:  08-13-17 WNL  Colonoscopy:  2012 Normal per patient - [place] BMD:   08-13-17 Osteopenia TDaP:  07-08-17 Gardasil: N/A   reports that she quit smoking about 21 years ago. She has a 1.00 pack-year smoking history. she has never used smokeless tobacco. She reports that she drinks about 2.4 oz of alcohol per week. She reports that she does not use drugs. She moved here from [place] in 10/17. Moved here to be with her boyfriend. Engaged, living together.  3 grown children, 2 sons, 1 daughter. 3 grand sons, 65, almost 2, and 2. Boys are in [place], daughter in [place]. Her parents live in [place]. She works at [employer].   Past Medical History:  Diagnosis Date  . Benign breast lumps   . Hyperlipidemia   . Kidney stone   . Osteopenia     Past Surgical History:  Procedure Laterality Date  . ABDOMINOPLASTY    . APPENDECTOMY    . BREAST BIOPSY Bilateral   . BREAST LUMPECTOMY Bilateral   . CESAREAN SECTION     x 1  . CHOLECYSTECTOMY    . INCONTINENCE SURGERY     TVT  . NOSE SURGERY    . OVARIAN CYST REMOVAL    . TONSILLECTOMY    . TOTAL VAGINAL HYSTERECTOMY  2007   still has her tubes and ovaries  . WRIST SURGERY Right     Current Outpatient Medications  Medication Sig Dispense Refill  . Calcium Carbonate-Vit D-Min (CALCIUM 1200 PO) Take by mouth.    .  Cyanocobalamin (VITAMIN B 12 PO) Take by mouth.    . Estradiol (YUVAFEM) 10 MCG TABS vaginal tablet Insert 1 TABLET TWICE A WEEK into vagina 24 tablet 3  . meloxicam (MOBIC) 15 MG tablet Take 1 tablet (15 mg total) daily by mouth. 90 tablet 1  . Multiple Vitamins-Minerals (VITAMIN D3 COMPLETE PO) Take 5,000 Units by mouth daily.    . simvastatin (ZOCOR) 10 MG tablet Take 1 tablet (10 mg total) at bedtime by mouth. Patient must have office visit before any further refills. 90 tablet 1  . TURMERIC PO Take by mouth.    . vitamin E 400 UNIT capsule Take 400 Units by mouth daily.     No current facility-administered medications for this visit.     Family History  Problem Relation Age of Onset  . Osteoporosis Mother   . Dementia Mother   . Osteopenia Mother   . Healthy Father   . Healthy Sister   . Healthy Brother   . Healthy Daughter   . Healthy Son   . Healthy Sister   . Healthy Sister   . Healthy Brother   . Healthy Son   . Uterine cancer Maternal Grandmother     Review of Systems  Constitutional: Negative.   HENT: Negative.  Eyes: Negative.   Respiratory: Negative.   Cardiovascular: Negative.   Gastrointestinal: Negative.   Endocrine: Negative.   Genitourinary: Negative.   Musculoskeletal: Negative.   Skin: Negative.   Allergic/Immunologic: Negative.   Neurological: Negative.   Psychiatric/Behavioral: Negative.     Exam:   BP 100/60 (BP Location: Right Arm, Patient Position: Sitting, Cuff Size: Normal)   Pulse 72   Resp 14   Ht 5\' 1"  (1.549 m)   Wt 125 lb (56.7 kg)   BMI 23.62 kg/m   Weight change: @WEIGHTCHANGE @ Height:   Height: 5\' 1"  (154.9 cm)  Ht Readings from Last 3 Encounters:  10/08/17 5\' 1"  (1.549 m)  09/03/17 5\' 1"  (1.549 m)  07/08/17 5\' 1"  (1.549 m)    General appearance: alert, cooperative and appears stated age Head: Normocephalic, without obvious abnormality, atraumatic Neck: no adenopathy, supple, symmetrical, trachea midline and thyroid  normal to inspection and palpation Lungs: clear to auscultation bilaterally Cardiovascular: regular rate and rhythm Breasts: normal appearance, no masses or tenderness Abdomen: soft, non-tender; non distended,  no masses,  no organomegaly Extremities: extremities normal, atraumatic, no cyanosis or edema Skin: Skin color, texture, turgor normal. No rashes or lesions Lymph nodes: Cervical, supraclavicular, and axillary nodes normal. No abnormal inguinal nodes palpated Neurologic: Grossly normal   Pelvic: External genitalia:  no lesions              Urethra:  normal appearing urethra with no masses, tenderness or lesions              Bartholins and Skenes: normal                 Vagina: normal appearing vagina with mesh noted on the anterior vaginal wall, with palpation approximately 1 cm area of erosion noted. No granulation tissue, not friable.               Cervix: absent               Bimanual Exam:  Uterus:  uterus absent              Adnexa: no mass, fullness, tenderness               Rectovaginal: Confirms               Anus:  normal sphincter tone, no lesions  Chaperone was present for exam.  A:  Well Woman with normal exam  Vaginal atrophy, doing well with vaginal estrogen  Vaginal mesh erosion noted, patient unaware of this. No pain, no dyspareunia  Reports few episodes of spots of blood in her underwear, thought it was from her bladder, more likely from her vagina. She first started noting this when she was sexually active again a few years ago.     P:   Mammogram and DEXA UTD  Pap not needed  Colonoscopy due at 49  Discussed breast self exam  Discussed calcium and vit D intake  Labs with primary   On Yuvafem, doesn't need a script  Check ccua, if dip + for blood will send for micro ua.   Get a copy of her records from last GYN, want to see if mesh was exposed at her last visit  F/U exam in 3 months , she will track any bleeding, pay attention to bleeding after  intercourse  Addendum: after her visit she noticed blood when she went to the bathroom. This is likely from the mesh. She gave a urine sample, + for  blood. Will have her return and give a urine sample when she isn't bleeding. If negative for blood on dip, won't send it out.   10/22/17 Addendum: Records reviewed, notes from 1/17 report her ho TVT and TVH on 04/28/13. At the time of her last visit, no mention was made of exposed mesh.

## 2017-10-08 NOTE — Patient Instructions (Signed)

## 2017-10-22 ENCOUNTER — Telehealth: Payer: Self-pay | Admitting: Obstetrics and Gynecology

## 2017-10-22 NOTE — Telephone Encounter (Signed)
Please inform the patient that her old records were reviewed, notes from 1/17 report her h/o TVT and TVH on 04/28/13. At the time of her last visit, no mention was made of exposed mesh. I would continue with our current plan of continuing her vagifem, f/u exam in 3 months. Calendar any bleeding and record if she had sex prior to the bleeding. She is also to come back to give [place] a urine sample when she is not bleeding. If dip + for blood, then send for micro ua. If negative, it doesn't have to be sent.

## 2017-10-23 NOTE — Telephone Encounter (Signed)
Left message to call back -eh

## 2017-10-27 ENCOUNTER — Telehealth: Payer: Self-pay | Admitting: Obstetrics and Gynecology

## 2017-10-27 NOTE — Telephone Encounter (Signed)
Patient says she is returning a call to Margaretha Sheffield to schedule an appointment.

## 2017-10-27 NOTE — Telephone Encounter (Signed)
Spoke with patient. Patient is returning call to the office to schedule urine check. Please see telephone call dated 10/22/2017. Nurse visit scheduled for 11/02/2017 at 8:30 am. Patient is agreeable to date and time.  Routing to provider for final review. Patient agreeable to disposition. Will close encounter.

## 2017-10-27 NOTE — Telephone Encounter (Signed)
Spoke with patient and gave recommendations. Patient is going to calendar her bleeding and follow up in 3 months. She is going to call back when she gets home to schedule to her U/S recheck -eh

## 2017-11-02 ENCOUNTER — Ambulatory Visit (INDEPENDENT_AMBULATORY_CARE_PROVIDER_SITE_OTHER): Payer: BLUE CROSS/BLUE SHIELD

## 2017-11-02 VITALS — BP 118/70 | HR 66 | Ht 61.0 in | Wt 128.0 lb

## 2017-11-02 DIAGNOSIS — Z87448 Personal history of other diseases of urinary system: Secondary | ICD-10-CM

## 2017-11-02 LAB — POCT URINALYSIS DIPSTICK
Bilirubin, UA: NEGATIVE
Blood, UA: NEGATIVE
Glucose, UA: NEGATIVE
Ketones, UA: NEGATIVE
Leukocytes, UA: NEGATIVE
NITRITE UA: NEGATIVE
PH UA: 5 (ref 5.0–8.0)
PROTEIN UA: NEGATIVE
UROBILINOGEN UA: 0.2 U/dL

## 2017-11-02 NOTE — Progress Notes (Signed)
Patient here for clean catch urine. Urine DHR:[id number]

## 2017-11-05 DIAGNOSIS — H04123 Dry eye syndrome of bilateral lacrimal glands: Secondary | ICD-10-CM | POA: Diagnosis not present

## 2017-11-05 DIAGNOSIS — H524 Presbyopia: Secondary | ICD-10-CM | POA: Diagnosis not present

## 2017-11-05 DIAGNOSIS — D3131 Benign neoplasm of right choroid: Secondary | ICD-10-CM | POA: Diagnosis not present

## 2017-11-05 DIAGNOSIS — H2513 Age-related nuclear cataract, bilateral: Secondary | ICD-10-CM | POA: Diagnosis not present

## 2017-11-13 ENCOUNTER — Other Ambulatory Visit: Payer: Self-pay | Admitting: Family Medicine

## 2017-11-13 DIAGNOSIS — M069 Rheumatoid arthritis, unspecified: Secondary | ICD-10-CM

## 2018-01-01 ENCOUNTER — Ambulatory Visit: Payer: BLUE CROSS/BLUE SHIELD | Admitting: Family Medicine

## 2018-01-05 ENCOUNTER — Other Ambulatory Visit: Payer: Self-pay

## 2018-01-05 ENCOUNTER — Ambulatory Visit: Payer: BLUE CROSS/BLUE SHIELD | Admitting: Obstetrics and Gynecology

## 2018-01-05 ENCOUNTER — Encounter: Payer: Self-pay | Admitting: Obstetrics and Gynecology

## 2018-01-05 ENCOUNTER — Encounter: Payer: Self-pay | Admitting: Family Medicine

## 2018-01-05 ENCOUNTER — Ambulatory Visit: Payer: BLUE CROSS/BLUE SHIELD | Admitting: Family Medicine

## 2018-01-05 VITALS — BP 110/70 | HR 72 | Resp 12 | Ht 61.0 in | Wt 125.4 lb

## 2018-01-05 VITALS — BP 120/72 | HR 74 | Ht 61.0 in | Wt 125.4 lb

## 2018-01-05 DIAGNOSIS — R7303 Prediabetes: Secondary | ICD-10-CM | POA: Insufficient documentation

## 2018-01-05 DIAGNOSIS — M1990 Unspecified osteoarthritis, unspecified site: Secondary | ICD-10-CM | POA: Diagnosis not present

## 2018-01-05 DIAGNOSIS — M069 Rheumatoid arthritis, unspecified: Secondary | ICD-10-CM

## 2018-01-05 DIAGNOSIS — T83711D Erosion of implanted vaginal mesh and other prosthetic materials to surrounding organ or tissue, subsequent encounter: Secondary | ICD-10-CM | POA: Diagnosis not present

## 2018-01-05 DIAGNOSIS — M159 Polyosteoarthritis, unspecified: Secondary | ICD-10-CM | POA: Insufficient documentation

## 2018-01-05 DIAGNOSIS — E782 Mixed hyperlipidemia: Secondary | ICD-10-CM

## 2018-01-05 DIAGNOSIS — R7309 Other abnormal glucose: Secondary | ICD-10-CM

## 2018-01-05 MED ORDER — SIMVASTATIN 10 MG PO TABS: 10.0000 mg | ORAL_TABLET | Freq: Every day | ORAL | 1 refills | Status: DC

## 2018-01-05 MED ORDER — MELOXICAM 15 MG PO TABS: 15.0000 mg | ORAL_TABLET | Freq: Every day | ORAL | 1 refills | Status: DC

## 2018-01-05 NOTE — Progress Notes (Signed)
Impression and Recommendations:    1. Arthritis- bilateral elbows and bilateral knees   2. Generalized OA   3. Rheumatoid arthritis involving both hands, unspecified rheumatoid factor presence ([place])   4. Mixed hyperlipidemia   5. Elevated hemoglobin A1c measurement     1. Hyperlipidemia - Continue on Zocor as nightly prescribed.  Patient tolerates medication well.  The 10-year ASCVD risk score Rachael Clark Rachael Clark., et al., 2013) is: 1.2%   Values used to calculate the score:     Age: 87 years     Sex: Female     Is Non-Hispanic African American: No     Diabetic: No     Tobacco smoker: No     Systolic Blood Pressure: 338 mmHg     Is BP treated: No     HDL Cholesterol: 90 mg/dL     Total Cholesterol: 185 mg/dL  2. Arthritis- Bilateral Elbows and Bilateral Knees - Patient has never had a formal workup for her chronic diffuse joint pain.  - Ambulatory Referral to Rheumatology. - Advised patient that she will need to follow up with rheumatology for her issues after evaluation.  - Advised patient that she should never be kneeling at work.  Recommended that the patient use a chair instead of kneeling, or sit on her bottom on the floor.  - Continue meloxicam 15 mg as prescribed.  Prescription refilled today.  - To treat her pain, use ice for 15-20 minutes 3-4 times per day.  3. Health & Lifestyle Maintenance - Patient continues on all medications as prescribed.  - To continue managing her chronic joint pain, advised patient to avoid weight gain and maintain a healthy diet.  - Advised patient to continue working toward exercising to improve health.    - Recommended that the patient eventually strive for at least 150 minutes of moderate cardiovascular activity per week as tolerated, according to guidelines established by the [facility].   - Healthy dietary habits encouraged, including low-carb, and high amounts of lean protein in diet.   - Patient should also consume adequate amounts  of water - half of body weight in oz of water per day.  4. Follow-Up - Return for scheduled CPE and blood work.   Orders Placed This Encounter  Procedures  . Ambulatory referral to Rheumatology    Meds ordered this encounter  Medications  . meloxicam (MOBIC) 15 MG tablet    Sig: Take 1 tablet (15 mg total) by mouth daily.    Dispense:  90 tablet    Refill:  1    DX Code Needed  .  . simvastatin (ZOCOR) 10 MG tablet    Sig: Take 1 tablet (10 mg total) by mouth at bedtime. Patient must have office visit before any further refills.    Dispense:  90 tablet    Refill:  1    Gross side effects, risk and benefits, and alternatives of medications and treatment plan in general discussed with patient.  Patient is aware that all medications have potential side effects and we are unable to predict every side effect or drug-drug interaction that may occur.   Patient will call with any questions prior to using medication if they have concerns.  Expresses verbal understanding and consents to current therapy and treatment regimen.  No barriers to understanding were identified.  Red flag symptoms and signs discussed in detail.  Patient expressed understanding regarding what to do in case of emergency\urgent symptoms  Please see AVS  handed out to patient at the end of our visit for further patient instructions/ counseling done pertaining to today's office visit.   Return for Follow-up 4-6 mo and prn.    Note: This note was prepared with assistance of Dragon voice recognition software. Occasional wrong-word or sound-a-like substitutions may have occurred due to the inherent limitations of voice recognition software.   This document serves as a record of services personally performed by Mellody Dance, DO. It was created on her behalf by Toni Amend, a trained medical scribe. The creation of this record is based on the scribe's personal observations and the provider's statements to them.   I  have reviewed the above medical documentation for accuracy and completeness and I concur.  Mellody Dance 01/05/18 5:42 PM   -------------------------------------------------------------------------------------------------------------------------------------------------    Subjective:     HPI: Rachael Clark is a 57 y.o. female who presents to [place] at [facility] today for issues as discussed below.  Patient notes that she's been doing well.  Is taking all of her medicines as prescribed.  Denies issues with her Zocor; takes it every night before bed.  Chronic Joint Pain in Knees and Elbows Patient takes meloxicam every day.  Notes that this helps her pain. Patient hasn't taken anything else for the pain.  Patient has chronic pain in her elbows and knees.  She has never been to a rheumatologist.  In the past, she saw a "regular doctor" for her chronic bursitis, and admits that she previously saw orthopedics as well.  Has been having joint pain for over a year (since December 2017), worsening lately.  Never paid it much attention before, but now it's bothering her significantly.  Feels that the joints hurt at different times.  Elbow Pain Any time she uses her arms to pull up or push, her elbows hurt.  Whenever she bends down on her knees, they ache.  Notes that whenever she touches any joint, this causes pain.  To relieve the pain, she stretches and moves her joints.  Notes that stretching and moving does not worsen the pain, and instead helps to alleviate it.  The pain is worse when she wakes up in the morning.  Once she gets moving, activity helps relieve it.  Knee Pain Patient notes that she does bend down and kneel at work to [employer].  Her knees hurt worse at night after a day of working and kneeling.  Climbing stairs is okay.  Her knees start hurting when walking on hilly terrain.  Patient notes that her knees  hurt mostly if it's "too much work, when I push too much."  Denies any swelling.   Wt Readings from Last 3 Encounters:  01/05/18 125 lb 6.4 oz (56.9 kg)  01/05/18 125 lb 6.4 oz (56.9 kg)  11/02/17 128 lb (58.1 kg)   BP Readings from Last 3 Encounters:  01/05/18 120/72  01/05/18 110/70  11/02/17 118/70   Pulse Readings from Last 3 Encounters:  01/05/18 74  01/05/18 72  11/02/17 66   BMI Readings from Last 3 Encounters:  01/05/18 23.69 kg/m  01/05/18 23.69 kg/m  11/02/17 24.19 kg/m     Patient Care Team    Relationship Specialty Notifications Start End  Mellody Dance, DO PCP - General Family Medicine  08/27/16      Patient Active Problem List   Diagnosis Date Noted  . Hyperlipidemia 08/27/2016    Priority: High  . Arthritis-  bilateral elbows and bilateral knees 01/05/2018    Priority: Medium  . Generalized OA 01/05/2018    Priority: Medium  . Osteopenia-  q 2 yrs bone scan-  last one was bone density 2015 08/27/2016    Priority: Medium  . Family history of malignant neoplasm of uterus 09/03/2017    Priority: Low  . Rheumatoid arthritis involving both hands ([place]) 01/22/2017    Priority: Low  . Elevated hemoglobin A1c measurement 01/05/2018  . 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 Dense breast tissue 09/03/2017  . Osteopenia after menopause 09/03/2017  . History of ovarian cyst 09/03/2017  . Carpal tunnel syndrome, bilateral 03/25/2017  . Vaginal dryness, menopausal 03/25/2017  . Chronic left shoulder pain 01/22/2017  . Chronic shoulder bursitis, left 01/22/2017  . Muscle strain 01/22/2017  . Encounter for screening mammogram for breast cancer 01/22/2017  . Benign breast lumps- B/L FNA's - both Benign 08/27/2016  . 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 Vaginal atrophy 08/27/2016    Past Medical history, Surgical history, Family history, Social history, Allergies and Medications have been entered into the medical record, reviewed and changed as needed.     Current Meds  Medication Sig  . Calcium Carbonate-Vit D-Min (CALCIUM 1200 PO) Take by mouth.  . Cyanocobalamin (VITAMIN B 12 PO) Take by mouth.  . Estradiol (YUVAFEM) 10 MCG TABS vaginal tablet Insert 1 TABLET TWICE A WEEK into vagina  . meloxicam (MOBIC) 15 MG tablet Take 1 tablet (15 mg total) by mouth daily.  . Multiple Vitamins-Minerals (VITAMIN D3 COMPLETE PO) Take 5,000 Units by mouth daily.  . simvastatin (ZOCOR) 10 MG tablet Take 1 tablet (10 mg total) by mouth at bedtime. Patient must have office visit before any further refills.  . vitamin E 400 UNIT capsule Take 400 Units by mouth daily.  . [DISCONTINUED] meloxicam (MOBIC) 15 MG tablet TAKE 1 TABLET BY MOUTH EVERY DAY  . [DISCONTINUED] simvastatin (ZOCOR) 10 MG tablet Take 1 tablet (10 mg total) at bedtime by mouth. Patient must have office visit before any further refills.    Allergies:  No Known Allergies   Review of Systems:  A fourteen system review of systems was performed and found to be positive as per HPI.   Objective:   Blood pressure 120/72, pulse 74, height 5\' 1"  (1.549 m), weight 125 lb 6.4 oz (56.9 kg), SpO2 100 %. Body mass index is 23.69 kg/m. General:  Well Developed, well nourished, appropriate for stated age.  Neuro:  Alert and oriented,  extra-ocular muscles intact  HEENT:  Normocephalic, atraumatic, neck supple, no carotid bruits appreciated  Skin:  no gross rash, warm, pink. Cardiac:  RRR, S1 S2 Respiratory:  ECTA B/L and A/P, Not using accessory muscles, speaking in full sentences- unlabored. Vascular:  Ext warm, no cyanosis apprec.; cap RF less 2 sec. Psych:  No HI/SI, judgement and insight good, Euthymic mood. Full Affect.

## 2018-01-05 NOTE — Progress Notes (Signed)
GYNECOLOGY  VISIT   HPI: 57 y.o.   Divorced  Hispanic  female   [phone] with No LMP recorded. Patient has had a hysterectomy.   here for   3 month follow up. She was noted to have vaginal mesh erosion at her annual exam in 2/19. She did c/o of noticing a few spots of blood intermittently. She noticed a slight amount of spotting after intercourse since her last visit. Has had intercourse without bleeding. No baseline bleeding or pain. Partner doesn't feel the mesh. She is using the vaginal estrogen 2 x a week.   H/O TVH. Last urinalysis, without blood.   GYNECOLOGIC HISTORY: No LMP recorded. Patient has had a hysterectomy. Contraception: hysterectomy  Menopausal hormone therapy: Yuvafem         OB History    Gravida  4   Para  3   Term  3   Preterm      AB  1   Living  3     SAB  1   TAB      Ectopic      Multiple      Live Births  3              Patient Active Problem List   Diagnosis Date Noted  . 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 Dense breast tissue 09/03/2017  . Osteopenia after menopause 09/03/2017  . Family history of malignant neoplasm of uterus 09/03/2017  . History of ovarian cyst 09/03/2017  . Carpal tunnel syndrome, bilateral 03/25/2017  . Vaginal dryness, menopausal 03/25/2017  . Rheumatoid arthritis involving both hands ([place]) 01/22/2017  . Chronic left shoulder pain 01/22/2017  . Chronic shoulder bursitis, left 01/22/2017  . Muscle strain 01/22/2017  . Encounter for screening mammogram for breast cancer 01/22/2017  . Benign breast lumps- B/L FNA's - both Benign 08/27/2016  . Osteopenia-  q 2 yrs bone scan-  last one was bone density 2015 08/27/2016  . 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 h/o Hyperlipidemia 08/27/2016  . Vaginal atrophy 08/27/2016    Past Medical History:  Diagnosis Date  . Benign breast lumps   . Hyperlipidemia   . Kidney stone   . Osteopenia     Past Surgical History:  Procedure Laterality Date  .  ABDOMINOPLASTY    . APPENDECTOMY    . BREAST BIOPSY Bilateral   . BREAST LUMPECTOMY Bilateral   . CESAREAN SECTION     x 1  . CHOLECYSTECTOMY    . INCONTINENCE SURGERY     TVT  . NOSE SURGERY    . OVARIAN CYST REMOVAL    . TONSILLECTOMY    . TOTAL VAGINAL HYSTERECTOMY  2007   still has her tubes and ovaries  . WRIST SURGERY Right     Current Outpatient Medications  Medication Sig Dispense Refill  . Calcium Carbonate-Vit D-Min (CALCIUM 1200 PO) Take by mouth.    . Cyanocobalamin (VITAMIN B 12 PO) Take by mouth.    . Estradiol (YUVAFEM) 10 MCG TABS vaginal tablet Insert 1 TABLET TWICE A WEEK into vagina 24 tablet 3  . meloxicam (MOBIC) 15 MG tablet TAKE 1 TABLET BY MOUTH EVERY DAY 90 tablet 1  . Multiple Vitamins-Minerals (VITAMIN D3 COMPLETE PO) Take 5,000 Units by mouth daily.    . simvastatin (ZOCOR) 10 MG tablet Take 1 tablet (10 mg total) at bedtime by mouth. Patient must have office visit before any further refills. 90 tablet 1  . TURMERIC PO Take by mouth.    Marland Kitchen  vitamin E 400 UNIT capsule Take 400 Units by mouth daily.     No current facility-administered medications for this visit.      ALLERGIES: Patient has no known allergies.  Family History  Problem Relation Age of Onset  . Osteoporosis Mother   . Dementia Mother   . Osteopenia Mother   . Healthy Father   . Healthy Sister   . Healthy Brother   . Healthy Daughter   . Healthy Son   . Healthy Sister   . Healthy Sister   . Healthy Brother   . Healthy Son   . Uterine cancer Maternal Grandmother     Social History   Socioeconomic History  . Marital status: Divorced    Spouse name: Not on file  . Number of children: Not on file  . Years of education: Not on file  . Highest education level: Not on file  Occupational History  . Not on file  Social Needs  . Financial resource strain: Not on file  . Food insecurity:    Worry: Not on file    Inability: Not on file  . Transportation needs:    Medical: Not  on file    Non-medical: Not on file  Tobacco Use  . Smoking status: Former Smoker    Packs/day: 0.25    Years: 4.00    Pack years: 1.00    Last attempt to quit: 05/02/1996    Years since quitting: 21.6  . Smokeless tobacco: Never Used  Substance and Sexual Activity  . Alcohol use: Yes    Alcohol/week: 2.4 oz    Types: 4 Glasses of wine per week  . Drug use: No  . Sexual activity: Yes    Partners: Male    Birth control/protection: Surgical  Lifestyle  . Physical activity:    Days per week: Not on file    Minutes per session: Not on file  . Stress: Not on file  Relationships  . Social connections:    Talks on phone: Not on file    Gets together: Not on file    Attends religious service: Not on file    Active member of club or organization: Not on file    Attends meetings of clubs or organizations: Not on file    Relationship status: Not on file  . Intimate partner violence:    Fear of current or ex partner: Not on file    Emotionally abused: Not on file    Physically abused: Not on file    Forced sexual activity: Not on file  Other Topics Concern  . Not on file  Social History Narrative  . Not on file    Review of Systems  Constitutional: Negative.   HENT: Negative.   Eyes: Negative.   Respiratory: Negative.   Cardiovascular: Negative.   Gastrointestinal: Negative.   Genitourinary: Negative.   Musculoskeletal: Positive for joint pain and myalgias.  Skin:       Hair loss  Neurological: Negative.   Endo/Heme/Allergies: Negative.   Psychiatric/Behavioral: Negative.     PHYSICAL EXAMINATION:    BP 110/70 (BP Location: Right Arm, Patient Position: Sitting, Cuff Size: Normal)   Pulse 72   Resp 12   Ht 5\' 1"  (1.549 m)   Wt 125 lb 6.4 oz (56.9 kg)   BMI 23.69 kg/m     General appearance: alert, cooperative and appears stated age  Pelvic: External genitalia:  no lesions  Urethra:  normal appearing urethra with no masses, tenderness or lesions               Bartholins and Skenes: normal                 Vagina: normal appearing vagina with normal color and discharge, there is a small amount of visible mesh in the mid urethral region of the vagina. It is easier to palpate than visualize. Feels to be an approximately 1 cm area of exposed mesh, only a few mm are seen. No granulation tissue, no irritation, no bleeding.               Cervix: absent  Chaperone was present for exam.  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 No other c/o. Not worsening on exam    PLAN Continue to use vaginal estrogen Use a lubricant with intercourse Call with worsening symptoms, otherwise f/u for an annual exam in 2/20   An After Visit Summary was printed and given to the patient.

## 2018-07-26 ENCOUNTER — Other Ambulatory Visit: Payer: Self-pay | Admitting: Family Medicine

## 2018-07-26 DIAGNOSIS — E782 Mixed hyperlipidemia: Secondary | ICD-10-CM

## 2018-08-19 ENCOUNTER — Other Ambulatory Visit: Payer: Self-pay | Admitting: Family Medicine

## 2018-08-19 DIAGNOSIS — E782 Mixed hyperlipidemia: Secondary | ICD-10-CM

## 2018-08-20 ENCOUNTER — Other Ambulatory Visit: Payer: Self-pay | Admitting: Family Medicine

## 2018-08-20 DIAGNOSIS — N952 Postmenopausal atrophic vaginitis: Secondary | ICD-10-CM

## 2018-08-20 DIAGNOSIS — N951 Menopausal and female climacteric states: Secondary | ICD-10-CM

## 2018-09-15 ENCOUNTER — Ambulatory Visit (INDEPENDENT_AMBULATORY_CARE_PROVIDER_SITE_OTHER): Payer: BLUE CROSS/BLUE SHIELD | Admitting: Family Medicine

## 2018-09-15 ENCOUNTER — Encounter: Payer: Self-pay | Admitting: Family Medicine

## 2018-09-15 VITALS — BP 106/65 | HR 69 | Temp 97.6°F | Ht 61.0 in | Wt 125.5 lb

## 2018-09-15 DIAGNOSIS — M159 Polyosteoarthritis, unspecified: Secondary | ICD-10-CM

## 2018-09-15 DIAGNOSIS — Z23 Encounter for immunization: Secondary | ICD-10-CM | POA: Diagnosis not present

## 2018-09-15 DIAGNOSIS — Z1231 Encounter for screening mammogram for malignant neoplasm of breast: Secondary | ICD-10-CM

## 2018-09-15 DIAGNOSIS — M8589 Other specified disorders of bone density and structure, multiple sites: Secondary | ICD-10-CM | POA: Diagnosis not present

## 2018-09-15 DIAGNOSIS — Z1211 Encounter for screening for malignant neoplasm of colon: Secondary | ICD-10-CM

## 2018-09-15 DIAGNOSIS — Z719 Counseling, unspecified: Secondary | ICD-10-CM | POA: Diagnosis not present

## 2018-09-15 DIAGNOSIS — Z0000 Encounter for general adult medical examination without abnormal findings: Secondary | ICD-10-CM | POA: Diagnosis not present

## 2018-09-15 DIAGNOSIS — R7309 Other abnormal glucose: Secondary | ICD-10-CM | POA: Diagnosis not present

## 2018-09-15 DIAGNOSIS — E782 Mixed hyperlipidemia: Secondary | ICD-10-CM | POA: Diagnosis not present

## 2018-09-15 MED ORDER — ZOSTER VAC RECOMB ADJUVANTED 50 MCG/0.5ML IM SUSR: 0.5000 mL | Freq: Once | INTRAMUSCULAR | 0 refills | Status: AC

## 2018-09-15 NOTE — Patient Instructions (Addendum)
Please make sure you talk to your OB/GYN regarding bone scans.  Please send [place] these results as well.  Please remember to go for mammogram soon.  If you find out who was your gastroenterologist in [place], please call them and have [place] send [place] those records.     Preventive Care for Adults, Female  A healthy lifestyle and preventive care can promote health and wellness. Preventive health guidelines for women include the following key practices.   A routine yearly physical is a good way to check with your health care provider about your health and preventive screening. It is a chance to share any concerns and updates on your health and to receive a thorough exam.   Visit your dentist for a routine exam and preventive care every 6 months. Brush your teeth twice a day and floss once a day. Good oral hygiene prevents tooth decay and gum disease.   The frequency of eye exams is based on your age, health, family medical history, use of contact lenses, and other factors. Follow your health care provider's recommendations for frequency of eye exams.   Eat a healthy diet. Foods like vegetables, fruits, whole grains, low-fat dairy products, and lean protein foods contain the nutrients you need without too many calories. Decrease your intake of foods high in solid fats, added sugars, and salt. Eat the right amount of calories for you.Get information about a proper diet from your health care provider, if necessary.   Regular physical exercise is one of the most important things you can do for your health. Most adults should get at least 150 minutes of moderate-intensity exercise (any activity that increases your heart rate and causes you to sweat) each week. In addition, most adults need muscle-strengthening exercises on 2 or more days a week.   Maintain a healthy weight. The body mass index (BMI) is a screening tool to identify possible weight problems. It provides an estimate of body fat based on  height and weight. Your health care provider can find your BMI, and can help you achieve or maintain a healthy weight.For adults 20 years and older:   - A BMI below 18.5 is considered underweight.   - A BMI of 18.5 to 24.9 is normal.   - A BMI of 25 to 29.9 is considered overweight.   - A BMI of 30 and above is considered obese.   Maintain normal blood lipids and cholesterol levels by exercising and minimizing your intake of trans and saturated fats.  Eat a balanced diet with plenty of fruit and vegetables. Blood tests for lipids and cholesterol should begin at age 93 and be repeated every 5 years minimum.  If your lipid or cholesterol levels are high, you are over 40, or you are at high risk for heart disease, you may need your cholesterol levels checked more frequently.Ongoing high lipid and cholesterol levels should be treated with medicines if diet and exercise are not working.   If you smoke, find out from your health care provider how to quit. If you do not use tobacco, do not start.   Lung cancer screening is recommended for adults aged 82-80 years who are at high risk for developing lung cancer because of a history of smoking. A yearly low-dose CT scan of the lungs is recommended for people who have at least a 30-pack-year history of smoking and are a current smoker or have quit within the past 15 years. A pack year of smoking is smoking an average  of 1 pack of cigarettes a day for 1 year (for example: 1 pack a day for 30 years or 2 packs a day for 15 years). Yearly screening should continue until the smoker has stopped smoking for at least 15 years. Yearly screening should be stopped for people who develop a health problem that would prevent them from having lung cancer treatment.   If you are pregnant, do not drink alcohol. If you are breastfeeding, be very cautious about drinking alcohol. If you are not pregnant and choose to drink alcohol, do not have more than 1 drink per day. One  drink is considered to be 12 ounces (355 mL) of beer, 5 ounces (148 mL) of wine, or 1.5 ounces (44 mL) of liquor.   Avoid use of street drugs. Do not share needles with anyone. Ask for help if you need support or instructions about stopping the use of drugs.   High blood pressure causes heart disease and increases the risk of stroke. Your blood pressure should be checked at least yearly.  Ongoing high blood pressure should be treated with medicines if weight loss and exercise do not work.   If you are 27-67 years old, ask your health care provider if you should take aspirin to prevent strokes.   Diabetes screening involves taking a blood sample to check your fasting blood sugar level. This should be done once every 3 years, after age 60, if you are within normal weight and without risk factors for diabetes. Testing should be considered at a younger age or be carried out more frequently if you are overweight and have at least 1 risk factor for diabetes.   Breast cancer screening is essential preventive care for women. You should practice "breast self-awareness."  This means understanding the normal appearance and feel of your breasts and may include breast self-examination.  Any changes detected, no matter how small, should be reported to a health care provider.  Women in their 12s and 30s should have a clinical breast exam (CBE) by a health care provider as part of a regular health exam every 1 to 3 years.  After age 7, women should have a CBE every year.  Starting at age 36, women should consider having a mammogram (breast X-ray test) every year.  Women who have a family history of breast cancer should talk to their health care provider about genetic screening.  Women at a high risk of breast cancer should talk to their health care providers about having an MRI and a mammogram every year.   -Breast cancer gene (BRCA)-related cancer risk assessment is recommended for women who have family members  with BRCA-related cancers. BRCA-related cancers include breast, ovarian, tubal, and peritoneal cancers. Having family members with these cancers may be associated with an increased risk for harmful changes (mutations) in the breast cancer genes BRCA1 and BRCA2. Results of the assessment will determine the need for genetic counseling and BRCA1 and BRCA2 testing.   The Pap test is a screening test for cervical cancer. A Pap test can show cell changes on the cervix that might become cervical cancer if left untreated. A Pap test is a procedure in which cells are obtained and examined from the lower end of the uterus (cervix).   - Women should have a Pap test starting at age 12.   - Between ages 28 and 58, Pap tests should be repeated every 2 years.   - Beginning at age 41, you should have a Pap test  every 3 years as long as the past 3 Pap tests have been normal.   - Some women have medical problems that increase the chance of getting cervical cancer. Talk to your health care provider about these problems. It is especially important to talk to your health care provider if a new problem develops soon after your last Pap test. In these cases, your health care provider may recommend more frequent screening and Pap tests.   - The above recommendations are the same for women who have or have not gotten the vaccine for human papillomavirus (HPV).   - If you had a hysterectomy for a problem that was not cancer or a condition that could lead to cancer, then you no longer need Pap tests. Even if you no longer need a Pap test, a regular exam is a good idea to make sure no other problems are starting.   - If you are between ages 28 and 50 years, and you have had normal Pap tests going back 10 years, you no longer need Pap tests. Even if you no longer need a Pap test, a regular exam is a good idea to make sure no other problems are starting.   - If you have had past treatment for cervical cancer or a condition  that could lead to cancer, you need Pap tests and screening for cancer for at least 20 years after your treatment.   - If Pap tests have been discontinued, risk factors (such as a new sexual partner) need to be reassessed to determine if screening should be resumed.   - The HPV test is an additional test that may be used for cervical cancer screening. The HPV test looks for the virus that can cause the cell changes on the cervix. The cells collected during the Pap test can be tested for HPV. The HPV test could be used to screen women aged 54 years and older, and should be used in women of any age who have unclear Pap test results. After the age of 61, women should have HPV testing at the same frequency as a Pap test.   Colorectal cancer can be detected and often prevented. Most routine colorectal cancer screening begins at the age of 83 years and continues through age 49 years. However, your health care provider may recommend screening at an earlier age if you have risk factors for colon cancer. On a yearly basis, your health care provider may provide home test kits to check for hidden blood in the stool.  Use of a small camera at the end of a tube, to directly examine the colon (sigmoidoscopy or colonoscopy), can detect the earliest forms of colorectal cancer. Talk to your health care provider about this at age 16, when routine screening begins. Direct exam of the colon should be repeated every 5 -10 years through age 7 years, unless early forms of pre-cancerous polyps or small growths are found.   People who are at an increased risk for hepatitis B should be screened for this virus. You are considered at high risk for hepatitis B if:  -You were born in a country where hepatitis B occurs often. Talk with your health care provider about which countries are considered high risk.  - Your parents were born in a high-risk country and you have not received a shot to protect against hepatitis B (hepatitis B  vaccine).  - You have HIV or AIDS.  - You use needles to inject street drugs.  -  You live with, or have sex with, someone who has Hepatitis B.  - You get hemodialysis treatment.  - You take certain medicines for conditions like cancer, organ transplantation, and autoimmune conditions.   Hepatitis C blood testing is recommended for all people born from [birth year] through 1965 and any individual with known risks for hepatitis C.   Practice safe sex. Use condoms and avoid high-risk sexual practices to reduce the spread of sexually transmitted infections (STIs). STIs include gonorrhea, chlamydia, syphilis, trichomonas, herpes, HPV, and human immunodeficiency virus (HIV). Herpes, HIV, and HPV are viral illnesses that have no cure. They can result in disability, cancer, and death. Sexually active women aged 39 years and younger should be checked for chlamydia. Older women with new or multiple partners should also be tested for chlamydia. Testing for other STIs is recommended if you are sexually active and at increased risk.   Osteoporosis is a disease in which the bones lose minerals and strength with aging. This can result in serious bone fractures or breaks. The risk of osteoporosis can be identified using a bone density scan. Women ages 26 years and over and women at risk for fractures or osteoporosis should discuss screening with their health care providers. Ask your health care provider whether you should take a calcium supplement or vitamin D to There are also several preventive steps women can take to avoid osteoporosis and resulting fractures or to keep osteoporosis from worsening. -->Recommendations include:  Eat a balanced diet high in fruits, vegetables, calcium, and vitamins.  Get enough calcium. The recommended total intake of is 1,200 mg daily; for best absorption, if taking supplements, divide doses into 250-500 mg doses throughout the day. Of the two types of calcium, calcium carbonate is best  absorbed when taken with food but calcium citrate can be taken on an empty stomach.  Get enough vitamin D. [employer] and the [place] recommend at least 1,000 IU per day for women age 59 and over who are at risk of vitamin D deficiency. Vitamin D deficiency can be caused by inadequate sun exposure (for example, those who live in [place]).  Avoid alcohol and smoking. Heavy alcohol intake (more than 7 drinks per week) increases the risk of falls and hip fracture and women smokers tend to lose bone more rapidly and have lower bone mass than nonsmokers. Stopping smoking is one of the most important changes women can make to improve their health and decrease risk for disease.  Be physically active every day. Weight-bearing exercise (for example, fast walking, hiking, jogging, and weight training) may strengthen bones or slow the rate of bone loss that comes with aging. Balancing and muscle-strengthening exercises can reduce the risk of falling and fracture.  Consider therapeutic medications. Currently, several types of effective drugs are available. Healthcare providers can recommend the type most appropriate for each woman.  Eliminate environmental factors that may contribute to accidents. Falls cause nearly 90% of all osteoporotic fractures, so reducing this risk is an important bone-health strategy. Measures include ample lighting, removing obstructions to walking, using nonskid rugs on floors, and placing mats and/or grab bars in showers.  Be aware of medication side effects. Some common medicines make bones weaker. These include a type of steroid drug called glucocorticoids used for arthritis and asthma, some antiseizure drugs, certain sleeping pills, treatments for endometriosis, and some cancer drugs. An overactive thyroid gland or using too much thyroid hormone for an underactive thyroid can also be a problem. If you are  taking these medicines, talk to your doctor about  what you can do to help protect your bones.reduce the rate of osteoporosis.    Menopause can be associated with physical symptoms and risks. Hormone replacement therapy is available to decrease symptoms and risks. You should talk to your health care provider about whether hormone replacement therapy is right for you.   Use sunscreen. Apply sunscreen liberally and repeatedly throughout the day. You should seek shade when your shadow is shorter than you. Protect yourself by wearing long sleeves, pants, a wide-brimmed hat, and sunglasses year round, whenever you are outdoors.   Once a month, do a whole body skin exam, using a mirror to look at the skin on your back. Tell your health care provider of new moles, moles that have irregular borders, moles that are larger than a pencil eraser, or moles that have changed in shape or color.   -Stay current with required vaccines (immunizations).   Influenza vaccine. All adults should be immunized every year.  Tetanus, diphtheria, and acellular pertussis (Td, Tdap) vaccine. Pregnant women should receive 1 dose of Tdap vaccine during each pregnancy. The dose should be obtained regardless of the length of time since the last dose. Immunization is preferred during the 27th 36th week of gestation. An adult who has not previously received Tdap or who does not know her vaccine status should receive 1 dose of Tdap. This initial dose should be followed by tetanus and diphtheria toxoids (Td) booster doses every 10 years. Adults with an unknown or incomplete history of completing a 3-dose immunization series with Td-containing vaccines should begin or complete a primary immunization series including a Tdap dose. Adults should receive a Td booster every 10 years.  Varicella vaccine. An adult without evidence of immunity to varicella should receive 2 doses or a second dose if she has previously received 1 dose. Pregnant females who do not have evidence of immunity  should receive the first dose after pregnancy. This first dose should be obtained before leaving the health care facility. The second dose should be obtained 4 8 weeks after the first dose.  Human papillomavirus (HPV) vaccine. Females aged 47 26 years who have not received the vaccine previously should obtain the 3-dose series. The vaccine is not recommended for use in pregnant females. However, pregnancy testing is not needed before receiving a dose. If a female is found to be pregnant after receiving a dose, no treatment is needed. In that case, the remaining doses should be delayed until after the pregnancy. Immunization is recommended for any person with an immunocompromised condition through the age of 35 years if she did not get any or all doses earlier. During the 3-dose series, the second dose should be obtained 4 8 weeks after the first dose. The third dose should be obtained 24 weeks after the first dose and 16 weeks after the second dose.  Zoster vaccine. One dose is recommended for adults aged 66 years or older unless certain conditions are present.  Measles, mumps, and rubella (MMR) vaccine. Adults born before 3 generally are considered immune to measles and mumps. Adults born in [birth year] or later should have 1 or more doses of MMR vaccine unless there is a contraindication to the vaccine or there is laboratory evidence of immunity to each of the three diseases. A routine second dose of MMR vaccine should be obtained at least 28 days after the first dose for students attending postsecondary schools, health care workers, or international travelers. People  who received inactivated measles vaccine or an unknown type of measles vaccine during 1963 1967 should receive 2 doses of MMR vaccine. People who received inactivated mumps vaccine or an unknown type of mumps vaccine before 1979 and are at high risk for mumps infection should consider immunization with 2 doses of MMR vaccine. For females of  childbearing age, rubella immunity should be determined. If there is no evidence of immunity, females who are not pregnant should be vaccinated. If there is no evidence of immunity, females who are pregnant should delay immunization until after pregnancy. Unvaccinated health care workers born before [birth year] who lack laboratory evidence of measles, mumps, or rubella immunity or laboratory confirmation of disease should consider measles and mumps immunization with 2 doses of MMR vaccine or rubella immunization with 1 dose of MMR vaccine.  Pneumococcal 13-valent conjugate (PCV13) vaccine. When indicated, a person who is uncertain of her immunization history and has no record of immunization should receive the PCV13 vaccine. An adult aged 81 years or older who has certain medical conditions and has not been previously immunized should receive 1 dose of PCV13 vaccine. This PCV13 should be followed with a dose of pneumococcal polysaccharide (PPSV23) vaccine. The PPSV23 vaccine dose should be obtained at least 8 weeks after the dose of PCV13 vaccine. An adult aged 2 years or older who has certain medical conditions and previously received 1 or more doses of PPSV23 vaccine should receive 1 dose of PCV13. The PCV13 vaccine dose should be obtained 1 or more years after the last PPSV23 vaccine dose.  Pneumococcal polysaccharide (PPSV23) vaccine. When PCV13 is also indicated, PCV13 should be obtained first. All adults aged 73 years and older should be immunized. An adult younger than age 53 years who has certain medical conditions should be immunized. Any person who resides in a nursing home or long-term care facility should be immunized. An adult smoker should be immunized. People with an immunocompromised condition and certain other conditions should receive both PCV13 and PPSV23 vaccines. People with human immunodeficiency virus (HIV) infection should be immunized as soon as possible after diagnosis. Immunization during  chemotherapy or radiation therapy should be avoided. Routine use of PPSV23 vaccine is not recommended for American Indians, [place] Natives, or people younger than 65 years unless there are medical conditions that require PPSV23 vaccine. When indicated, people who have unknown immunization and have no record of immunization should receive PPSV23 vaccine. One-time revaccination 5 years after the first dose of PPSV23 is recommended for people aged 52 64 years who have chronic kidney failure, nephrotic syndrome, asplenia, or immunocompromised conditions. People who received 1 2 doses of PPSV23 before age 48 years should receive another dose of PPSV23 vaccine at age 48 years or later if at least 5 years have passed since the previous dose. Doses of PPSV23 are not needed for people immunized with PPSV23 at or after age 59 years.  Meningococcal vaccine. Adults with asplenia or persistent complement component deficiencies should receive 2 doses of quadrivalent meningococcal conjugate (MenACWY-D) vaccine. The doses should be obtained at least 2 months apart. Microbiologists working with certain meningococcal bacteria, [employer] recruits, people at risk during an outbreak, and people who travel to or live in countries with a high rate of meningitis should be immunized. A first-year college student up through age 86 years who is living in a residence hall should receive a dose if she did not receive a dose on or after her 16th birthday. Adults who have certain high-risk conditions  should receive one or more doses of vaccine.  Hepatitis A vaccine. Adults who wish to be protected from this disease, have certain high-risk conditions, work with hepatitis A-infected animals, work in hepatitis A research labs, or travel to or work in countries with a high rate of hepatitis A should be immunized. Adults who were previously unvaccinated and who anticipate close contact with an international adoptee during the first 60 days after  arrival in the [place] from a country with a high rate of hepatitis A should be immunized.  Hepatitis B vaccine.  Adults who wish to be protected from this disease, have certain high-risk conditions, may be exposed to blood or other infectious body fluids, are household contacts or sex partners of hepatitis B positive people, are clients or workers in certain care facilities, or travel to or work in countries with a high rate of hepatitis B should be immunized.  Haemophilus influenzae type b (Hib) vaccine. A previously unvaccinated person with asplenia or sickle cell disease or having a scheduled splenectomy should receive 1 dose of Hib vaccine. Regardless of previous immunization, a recipient of a hematopoietic stem cell transplant should receive a 3-dose series 6 12 months after her successful transplant. Hib vaccine is not recommended for adults with HIV infection.  Preventive Services / Frequency Ages 68 to 39years  Blood pressure check.** / Every 1 to 2 years.  Lipid and cholesterol check.** / Every 5 years beginning at age 32.  Clinical breast exam.** / Every 3 years for women in their 39s and 36s.  BRCA-related cancer risk assessment.** / For women who have family members with a BRCA-related cancer (breast, ovarian, tubal, or peritoneal cancers).  Pap test.** / Every 2 years from ages 62 through 55. Every 3 years starting at age 21 through age 48 or 42 with a history of 3 consecutive normal Pap tests.  HPV screening.** / Every 3 years from ages 74 through ages 58 to 68 with a history of 3 consecutive normal Pap tests.  Hepatitis C blood test.** / For any individual with known risks for hepatitis C.  Skin self-exam. / Monthly.  Influenza vaccine. / Every year.  Tetanus, diphtheria, and acellular pertussis (Tdap, Td) vaccine.** / Consult your health care provider. Pregnant women should receive 1 dose of Tdap vaccine during each pregnancy. 1 dose of Td every 10  years.  Varicella vaccine.** / Consult your health care provider. Pregnant females who do not have evidence of immunity should receive the first dose after pregnancy.  HPV vaccine. / 3 doses over 6 months, if 73 and younger. The vaccine is not recommended for use in pregnant females. However, pregnancy testing is not needed before receiving a dose.  Measles, mumps, rubella (MMR) vaccine.** / You need at least 1 dose of MMR if you were born in 1957 or later. You may also need a 2nd dose. For females of childbearing age, rubella immunity should be determined. If there is no evidence of immunity, females who are not pregnant should be vaccinated. If there is no evidence of immunity, females who are pregnant should delay immunization until after pregnancy.  Pneumococcal 13-valent conjugate (PCV13) vaccine.** / Consult your health care provider.  Pneumococcal polysaccharide (PPSV23) vaccine.** / 1 to 2 doses if you smoke cigarettes or if you have certain conditions.  Meningococcal vaccine.** / 1 dose if you are age 51 to 54 years and a Market researcher living in a residence hall, or have one of several medical conditions, you  need to get vaccinated against meningococcal disease. You may also need additional booster doses.  Hepatitis A vaccine.** / Consult your health care provider.  Hepatitis B vaccine.** / Consult your health care provider.  Haemophilus influenzae type b (Hib) vaccine.** / Consult your health care provider.  Ages 40 to 64years  Blood pressure check.** / Every 1 to 2 years.  Lipid and cholesterol check.** / Every 5 years beginning at age 20 years.  Lung cancer screening. / Every year if you are aged 55 80 years and have a 30-pack-year history of smoking and currently smoke or have quit within the past 15 years. Yearly screening is stopped once you have quit smoking for at least 15 years or develop a health problem that would prevent you from having lung cancer  treatment.  Clinical breast exam.** / Every year after age 40 years.  BRCA-related cancer risk assessment.** / For women who have family members with a BRCA-related cancer (breast, ovarian, tubal, or peritoneal cancers).  Mammogram.** / Every year beginning at age 40 years and continuing for as long as you are in good health. Consult with your health care provider.  Pap test.** / Every 3 years starting at age 30 years through age 65 or 70 years with a history of 3 consecutive normal Pap tests.  HPV screening.** / Every 3 years from ages 30 years through ages 65 to 70 years with a history of 3 consecutive normal Pap tests.  Fecal occult blood test (FOBT) of stool. / Every year beginning at age 50 years and continuing until age 75 years. You may not need to do this test if you get a colonoscopy every 10 years.  Flexible sigmoidoscopy or colonoscopy.** / Every 5 years for a flexible sigmoidoscopy or every 10 years for a colonoscopy beginning at age 50 years and continuing until age 75 years.  Hepatitis C blood test.** / For all people born from 1945 through 1965 and any individual with known risks for hepatitis C.  Skin self-exam. / Monthly.  Influenza vaccine. / Every year.  Tetanus, diphtheria, and acellular pertussis (Tdap/Td) vaccine.** / Consult your health care provider. Pregnant women should receive 1 dose of Tdap vaccine during each pregnancy. 1 dose of Td every 10 years.  Varicella vaccine.** / Consult your health care provider. Pregnant females who do not have evidence of immunity should receive the first dose after pregnancy.  Zoster vaccine.** / 1 dose for adults aged 60 years or older.  Measles, mumps, rubella (MMR) vaccine.** / You need at least 1 dose of MMR if you were born in 1957 or later. You may also need a 2nd dose. For females of childbearing age, rubella immunity should be determined. If there is no evidence of immunity, females who are not pregnant should be  vaccinated. If there is no evidence of immunity, females who are pregnant should delay immunization until after pregnancy.  Pneumococcal 13-valent conjugate (PCV13) vaccine.** / Consult your health care provider.  Pneumococcal polysaccharide (PPSV23) vaccine.** / 1 to 2 doses if you smoke cigarettes or if you have certain conditions.  Meningococcal vaccine.** / Consult your health care provider.  Hepatitis A vaccine.** / Consult your health care provider.  Hepatitis B vaccine.** / Consult your health care provider.  Haemophilus influenzae type b (Hib) vaccine.** / Consult your health care provider.  Ages 65 years and over  Blood pressure check.** / Every 1 to 2 years.  Lipid and cholesterol check.** / Every 5 years beginning at age 20   years.  Lung cancer screening. / Every year if you are aged 55 80 years and have a 30-pack-year history of smoking and currently smoke or have quit within the past 15 years. Yearly screening is stopped once you have quit smoking for at least 15 years or develop a health problem that would prevent you from having lung cancer treatment.  Clinical breast exam.** / Every year after age 40 years.  BRCA-related cancer risk assessment.** / For women who have family members with a BRCA-related cancer (breast, ovarian, tubal, or peritoneal cancers).  Mammogram.** / Every year beginning at age 40 years and continuing for as long as you are in good health. Consult with your health care provider.  Pap test.** / Every 3 years starting at age 30 years through age 65 or 70 years with 3 consecutive normal Pap tests. Testing can be stopped between 65 and 70 years with 3 consecutive normal Pap tests and no abnormal Pap or HPV tests in the past 10 years.  HPV screening.** / Every 3 years from ages 30 years through ages 65 or 70 years with a history of 3 consecutive normal Pap tests. Testing can be stopped between 65 and 70 years with 3 consecutive normal Pap tests and no  abnormal Pap or HPV tests in the past 10 years.  Fecal occult blood test (FOBT) of stool. / Every year beginning at age 50 years and continuing until age 75 years. You may not need to do this test if you get a colonoscopy every 10 years.  Flexible sigmoidoscopy or colonoscopy.** / Every 5 years for a flexible sigmoidoscopy or every 10 years for a colonoscopy beginning at age 50 years and continuing until age 75 years.  Hepatitis C blood test.** / For all people born from 1945 through 1965 and any individual with known risks for hepatitis C.  Osteoporosis screening.** / A one-time screening for women ages 65 years and over and women at risk for fractures or osteoporosis.  Skin self-exam. / Monthly.  Influenza vaccine. / Every year.  Tetanus, diphtheria, and acellular pertussis (Tdap/Td) vaccine.** / 1 dose of Td every 10 years.  Varicella vaccine.** / Consult your health care provider.  Zoster vaccine.** / 1 dose for adults aged 60 years or older.  Pneumococcal 13-valent conjugate (PCV13) vaccine.** / Consult your health care provider.  Pneumococcal polysaccharide (PPSV23) vaccine.** / 1 dose for all adults aged 65 years and older.  Meningococcal vaccine.** / Consult your health care provider.  Hepatitis A vaccine.** / Consult your health care provider.  Hepatitis B vaccine.** / Consult your health care provider.  Haemophilus influenzae type b (Hib) vaccine.** / Consult your health care provider. ** Family history and personal history of risk and conditions may change your health care provider's recommendations. Document Released: 10/14/2001 Document Revised: 06/08/2013  [employer] Patient Information 2014 [employer]   EXERCISE AND DIET:  We recommended that you start or continue a regular exercise program for good health. Regular exercise means any activity that makes your heart beat faster and makes you sweat.  We recommend exercising at least 30 minutes per day at least 3  days a week, preferably 5.  We also recommend a diet low in fat and sugar / carbohydrates.  Inactivity, poor dietary choices and obesity can cause diabetes, heart attack, stroke, and kidney damage, among others.     ALCOHOL AND SMOKING:  Women should limit their alcohol intake to no more than 7 drinks/beers/glasses of wine (combined, not each!) per   week. Moderation of alcohol intake to this level decreases your risk of breast cancer and liver damage.  ( And of course, no recreational drugs are part of a healthy lifestyle.)  Also, you should not be smoking at all or even being exposed to second hand smoke. Most people know smoking can cause cancer, and various heart and lung diseases, but did you know it also contributes to weakening of your bones?  Aging of your skin?  Yellowing of your teeth and nails?   CALCIUM AND VITAMIN D:  Adequate intake of calcium and Vitamin D are recommended.  The recommendations for exact amounts of these supplements seem to change often, but generally speaking 600 mg of calcium (either carbonate or citrate) and 800 units of Vitamin D per day seems prudent. Certain women may benefit from higher intake of Vitamin D.  If you are among these women, your doctor will have told you during your visit.     PAP SMEARS:  Pap smears, to check for cervical cancer or precancers,  have traditionally been done yearly, although recent scientific advances have shown that most women can have pap smears less often.  However, every woman still should have a physical exam from her gynecologist or primary care physician every year. It will include a breast check, inspection of the vulva and vagina to check for abnormal growths or skin changes, a visual exam of the cervix, and then an exam to evaluate the size and shape of the uterus and ovaries.  And after 58 years of age, a rectal exam is indicated to check for rectal cancers. We will also provide age appropriate advice regarding health  maintenance, like when you should have certain vaccines, screening for sexually transmitted diseases, bone density testing, colonoscopy, mammograms, etc.    MAMMOGRAMS:  All women over 41 years old should have a yearly mammogram. Many facilities now offer a "3D" mammogram, which may cost around $50 extra out of pocket. If possible,  we recommend you accept the option to have the 3D mammogram performed.  It both reduces the number of women who will be called back for extra views which then turn out to be normal, and it is better than the routine mammogram at detecting truly abnormal areas.     COLONOSCOPY:  Colonoscopy to screen for colon cancer is recommended for all women at age 95.  We know, you hate the idea of the prep.  We agree, BUT, having colon cancer and not knowing it is worse!!  Colon cancer so often starts as a polyp that can be seen and removed at colonscopy, which can quite literally save your life!  And if your first colonoscopy is normal and you have no family history of colon cancer, most women don't have to have it again for 10 years.  Once every ten years, you can do something that may end up saving your life, right?  We will be happy to help you get it scheduled when you are ready.  Be sure to check your insurance coverage so you understand how much it will cost.  It may be covered as a preventative service at no cost, but you should check your particular policy.

## 2018-09-15 NOTE — Progress Notes (Signed)
Impression and Recommendations:    1. Encounter for wellness examination   2. Health education/counseling   3. Generalized OA   4. Mixed hyperlipidemia   5. Osteopenia of multiple sites   6. Elevated hemoglobin A1c measurement   7. Healthcare maintenance   8. Encounter for screening mammogram for breast cancer   9. Need for shingles vaccine   10. Colon cancer screening   11. Flu vaccine need     - Discussed that if patient wishes to address acute or chronic concerns outside of her yearly physical, she may schedule an appointment PRN.  - Need for fasting lab work today.  Per patient, confirms she is fasting.  Lab work obtained today.  1) Anticipatory Guidance: Discussed importance of wearing a seatbelt while driving, not texting while driving; sunscreen when outside along with yearly skin surveillance; eating a well balanced and modest diet; physical activity at least 25 minutes per day or 150 min/ week of moderate to intense activity.  - Reviewed prudent skin screening habits with patient during appointment today.  Explained the "A,B,C,D" rule of skin screening to patient, paying attention to asymmetry, borders, color change, diameter, etc., to spots of concern.  - Reviewed prudent self-breast check habits with patient today.  Extensive education was provided.  Explained red flag signs and symptoms to watch for, and demonstrated techniques to patient during physical exam today.   2) Immunizations / Screenings / Labs:  - All immunizations and screenings that patient agrees to, are up-to-date per recommendations or will be updated today.  Patient understands the needs for q 30mo dental and yearly vision screens which pt will schedule independently. Obtain CBC, CMP, HgA1c, Lipid panel, TSH and vit D when fasting if not already done recently.   - Patient established with [facility]  Requested results of pap smear, mammogram, and bone density from OBGYN.  - Patient knows to  update clinic with information from her last colonoscopy, which, per patient, was obtained in [place] in 2014.  Per patient, insists everything was normal with repeat in 10 years.  Extensive education provided to patient today.  All questions were answered.  - IFOB cards provided to patient today.  - Need for mammogram.  Per patient, last mammogram December 2018.  - Last pap smear 2019.  Per patient, repeats every three years.  - TDAP up to date.  - Need for shingles vaccine.  Education about shingles vaccine provided today, and all questions answered.  Recommended that the patient call her insurance and inquire after cost or other recommendations prior to obtaining vaccine.  - Need for flu vaccine.  Patient will obtain today.  - Discussed low-risk adult HIV / Hepatitis C screen with patient today.  Educated patient about need for this screening based on CDC recommendations. Patient declines at this time.  Advised patient to look up information pertaining to this vaccine at the [facility] website.  - Patient with history of osteopenia in 2015.  Per patient, does not remember when her latest DEXA scan was obtained.  knows to update the clinic with the results of her last DEXA scan.  Will continue to monitor.  - Established at [facility].  Discussed need for patient to continue to obtain management and screenings with all established specialists.  Educated patient at length about the critical importance of keeping health maintenance up to date.  3) Weight: - Reviewed that patient's weight is within the normal range of BMI.  Discussed importance of  maintaining patient's current weight.  - Advised patient to improve nutrient density of diet through increasing intake of fruits and vegetables and decreasing saturated/trans fats, white flour products and refined sugar products.   4) BMI Counseling - Normal BMI, Body mass index is 23.71 kg/m. Explained to patient what BMI refers to, and what  it means medically.  Told patient to think about it as a "medical risk stratification measurement" and how increasing BMI is associated with increasing risk/ or worsening state of various diseases such as hypertension, hyperlipidemia, diabetes, premature OA, depression etc.  Reviewed that keeping her weight in the normal range will assist with her preventative health maintenance.  American Heart Association guidelines for healthy diet, basically Mediterranean diet, and exercise guidelines of 30 minutes 5 days per week or more discussed in detail.  Health counseling performed.  All questions answered.  5) Lifestyle & Preventative Health Maintenance - Advised patient to continue working toward exercising to improve overall mental, physical, and emotional health.    - Encouraged patient to focus on physical conditioning and training her muscles.  Discussed importance of weight-bearing exercise for her bone health.  Further recommended looking into another Zumba or local aerobics class, per patient's past interest in these activities.  - Encouraged patient to engage in daily physical activity, especially a formal exercise routine.  Recommended that the patient eventually strive for at least 150 minutes of moderate cardiovascular activity per week according to guidelines established by the [facility].   - Healthy dietary habits encouraged, including low-carb, and high amounts of lean protein in diet.   - Patient should also consume adequate amounts of water.   Meds ordered this encounter  Medications  . Zoster Vaccine Adjuvanted [facility]) injection    Sig: Inject 0.5 mLs into the muscle once for 1 dose.    Dispense:  0.5 mL    Refill:  0    Orders Placed This Encounter  Procedures  . MM Digital Screening  . Flu Vaccine QUAD 6+ mos PF IM (Fluarix Quad PF)  . CBC with Differential/Platelet  . Comprehensive metabolic panel  . Hemoglobin A1c  . Lipid panel  . T4, free  . TSH  . VITAMIN D 25  Hydroxy (Vit-D Deficiency, Fractures)  . Direct LDL    Gross side effects, risk and benefits, and alternatives of medications discussed with patient.  Patient is aware that all medications have potential side effects and we are unable to predict every side effect or drug-drug interaction that may occur.  Expresses verbal understanding and consents to current therapy plan and treatment regimen.  F-up preventative CPE in 1 year. F/up sooner for chronic care management as discussed and/or prn.  Please see orders placed and AVS handed out to patient at the end of our visit for further patient instructions/ counseling done pertaining to today's office visit.   This document serves as a record of services personally performed by Mellody Dance, DO. It was created on her behalf by Toni Amend, a trained medical scribe. The creation of this record is based on the scribe's personal observations and the provider's statements to them.   I have reviewed the above medical documentation for accuracy and completeness and I concur.  Mellody Dance, DO 09/15/2018 1:00 PM    Subjective:    Chief Complaint  Patient presents with  . Annual Exam    HPI: Ma D Rachael Clark is a 58 y.o. female who presents to [place] at [facility] today  a yearly health maintenance exam.  Health Maintenance Summary Reviewed and updated, unless pt declines services.  Colonoscopy:  Patient obtained colonoscopy in [place] in 2014, where she is formerly from.  Per patient, "I know it was every ten years."  States that they found nothing and "everything was good." Tobacco History Reviewed:  Y; quit in September of 1997, 0.25 ppd, one pack-year. Alcohol:  No concerns, no excessive use. Exercise Habits:  Not meeting AHA guidelines.  Humorously states she's "lazy."  In the past, patient indicates that she has enjoyed doing kickboxing and Zumba and other such aerobic exercise classes. STD concerns:   None. Drug Use:  None. Birth control method:  Post-menopausal. Menses regular:  Post-menopausal.    Lumps or breast concerns:  No. Breast Cancer Family History:  Denies family history of breast cancer, but does have family history of uterine cancer in her maternal grandmother. Osteopenia/DEXA:  Patient cannot remember when her most recent scan was obtained.  Feels that work is slow nowadays, "finally."  Female Health Last mammogram obtained in December 2018.  Obtains pap smears at [facility].  Last done in 2019.  Per patient, obtains pap smears every 3 years.    Last obtained a breast exam about a year ago, when her pap smear was done.  Dental Health Patient follows up with a clinic in [place].  She has an appointment next month.  Indicates that she obtains dental care regularly, but cannot confirm whether she obtains cleanings every six months.  Dermatological Health Does not have a dermatologist.  Has a spot on her hand that she asks about today.  Was reassured during appointment that it might be an allergic reaction.  Patient knows to return and have the spot on her hand addressed during an acute or chronic health management office visit if this area of concern does not improve.  Otherwise denies concerns about her skin or skin changes.  Patient notes that she does not use sunscreen.  GI Health Denies constipation, issues with stools.    Immunization History  Administered Date(s) Administered  . Influenza,inj,Quad PF,6+ Mos 07/08/2017, 09/15/2018  . Tdap 07/08/2017    Health Maintenance  Topic Date Due  . INFLUENZA VACCINE  04/01/2018  . Hepatitis C Screening  03/25/2029 (Originally 1960-10-07)  . HIV Screening  03/25/2029 (Originally 03/25/1976)  . MAMMOGRAM  08/14/2019  . DEXA SCAN  08/14/2019  . COLONOSCOPY  08/02/2023  . TETANUS/TDAP  07/09/2027  . PAP SMEAR-Modifier  Discontinued     Wt Readings from Last 3 Encounters:  09/15/18 125 lb 8 oz  (56.9 kg)  01/05/18 125 lb 6.4 oz (56.9 kg)  01/05/18 125 lb 6.4 oz (56.9 kg)   BP Readings from Last 3 Encounters:  09/15/18 106/65  01/05/18 120/72  01/05/18 110/70   Pulse Readings from Last 3 Encounters:  09/15/18 69  01/05/18 74  01/05/18 72     Past Medical History:  Diagnosis Date  . Benign breast lumps   . Hyperlipidemia   . Kidney stone   . Osteopenia       Past Surgical History:  Procedure Laterality Date  . ABDOMINOPLASTY    . APPENDECTOMY    . BREAST BIOPSY Bilateral   . BREAST LUMPECTOMY Bilateral   . CESAREAN SECTION     x 1  . CHOLECYSTECTOMY    . INCONTINENCE SURGERY     TVT  . NOSE SURGERY    . OVARIAN CYST REMOVAL    . TONSILLECTOMY    .  TOTAL VAGINAL HYSTERECTOMY  2007   still has her tubes and ovaries  . WRIST SURGERY Right       Family History  Problem Relation Age of Onset  . Osteoporosis Mother   . Dementia Mother   . Osteopenia Mother   . Healthy Father   . Healthy Sister   . Healthy Brother   . Healthy Daughter   . Healthy Son   . Healthy Sister   . Healthy Sister   . Healthy Brother   . Healthy Son   . Uterine cancer Maternal Grandmother       Social History   Substance and Sexual Activity  Drug Use No  ,   Social History   Substance and Sexual Activity  Alcohol Use Yes  . Alcohol/week: 4.0 standard drinks  . Types: 4 Glasses of wine per week  ,   Social History   Tobacco Use  Smoking Status Former Smoker  . Packs/day: 0.25  . Years: 4.00  . Pack years: 1.00  . Last attempt to quit: 05/02/1996  . Years since quitting: 22.3  Smokeless Tobacco Never Used  ,   Social History   Substance and Sexual Activity  Sexual Activity Yes  . Partners: Male  . Birth control/protection: Surgical    Current Outpatient Medications on File Prior to Visit  Medication Sig Dispense Refill  . Calcium Carbonate-Vit D-Min (CALCIUM 1200 PO) Take by mouth.    . Cyanocobalamin (VITAMIN B 12 PO) Take by mouth.    .  meloxicam (MOBIC) 15 MG tablet Take 1 tablet (15 mg total) by mouth daily. 90 tablet 1  . Multiple Vitamins-Minerals (VITAMIN D3 COMPLETE PO) Take 5,000 Units by mouth daily.    . simvastatin (ZOCOR) 10 MG tablet TAKE 1 TABLET BY MOUTH AT BEDTIME. PATIENT MUST HAVE OFFICE VISIT BEFORE ANY FURTHER REFILLS. 30 tablet 0  . TURMERIC PO Take by mouth.    . vitamin E 400 UNIT capsule Take 400 Units by mouth daily.    Merril Abbe 10 MCG TABS vaginal tablet INSERT 1 TABLET TWICE A WEEK INTO VAGINA 24 tablet 0   No current facility-administered medications on file prior to visit.     Allergies: Patient has no known allergies.  Review of Systems: General:   Denies fever, chills, unexplained weight loss.  Optho/Auditory:   Denies visual changes, blurred vision/LOV Respiratory:   Denies SOB, DOE more than baseline levels.  Cardiovascular:   Denies chest pain, palpitations, new onset peripheral edema  Gastrointestinal:   Denies nausea, vomiting, diarrhea.  Genitourinary: Denies dysuria, freq/ urgency, flank pain or discharge from genitals.  Endocrine:     Denies hot or cold intolerance, polyuria, polydipsia. Musculoskeletal:   Denies unexplained myalgias, joint swelling, unexplained arthralgias, gait problems.  Skin:  Denies rash, suspicious lesions Neurological:     Denies dizziness, unexplained weakness, numbness  Psychiatric/Behavioral:   Denies mood changes, suicidal or homicidal ideations, hallucinations    Objective:    Blood pressure 106/65, pulse 69, temperature 97.6 F (36.4 C), height 5\' 1"  (1.549 m), weight 125 lb 8 oz (56.9 kg), SpO2 99 %. Body mass index is 23.71 kg/m. General Appearance:    Alert, cooperative, no distress, appears stated age  Head:    Normocephalic, without obvious abnormality, atraumatic  Eyes:    PERRL, conjunctiva/corneas clear, EOM's intact, fundi    benign, both eyes  Ears:    Normal TM's and external ear canals, both ears  Nose:   Nares normal, septum  midline, mucosa normal, no drainage    or sinus tenderness  Throat:   Lips w/o lesion, mucosa moist, and tongue normal; teeth and   gums normal  Neck:   Supple, symmetrical, trachea midline, no adenopathy;    thyroid:  no enlargement/tenderness/nodules; no carotid   bruit or JVD  Back:     Symmetric, no curvature, ROM normal, no CVA tenderness  Lungs:     Clear to auscultation bilaterally, respirations unlabored, no       Wh/ R/ R  Chest Wall:    No tenderness or gross deformity; normal excursion   Heart:    Regular rate and rhythm, S1 and S2 normal, no murmur, rub   or gallop  Breast Exam:    No tenderness, masses, or nipple abnormality b/l; no d/c  Abdomen:     Soft, non-tender, bowel sounds active all four quadrants, NO   G/R/R, no masses, no organomegaly  Genitalia:    Deferred to OBGYN.  Rectal:    Deferred to OBGYN.  Extremities:   Extremities normal, atraumatic, no cyanosis or gross edema  Pulses:   2+ and symmetric all extremities  Skin:   Warm, dry, Skin color, texture, turgor normal, no obvious rashes or lesions Psych: No HI/SI, judgement and insight good, Euthymic mood. Full Affect.  Neurologic:   CNII-XII intact, normal strength, sensation and reflexes    Throughout

## 2018-09-16 LAB — COMPREHENSIVE METABOLIC PANEL
A/G RATIO: 2.3 — AB (ref 1.2–2.2)
ALT: 16 IU/L (ref 0–32)
AST: 14 IU/L (ref 0–40)
Albumin: 4.5 g/dL (ref 3.5–5.5)
Alkaline Phosphatase: 54 IU/L (ref 39–117)
BILIRUBIN TOTAL: 0.9 mg/dL (ref 0.0–1.2)
BUN / CREAT RATIO: 23 (ref 9–23)
BUN: 14 mg/dL (ref 6–24)
CALCIUM: 9.5 mg/dL (ref 8.7–10.2)
CO2: 22 mmol/L (ref 20–29)
Chloride: 106 mmol/L (ref 96–106)
Creatinine, Ser: 0.6 mg/dL (ref 0.57–1.00)
GFR calc Af Amer: 117 mL/min/{1.73_m2} (ref >59)
GFR, EST NON AFRICAN AMERICAN: 102 mL/min/{1.73_m2} (ref >59)
GLUCOSE: 99 mg/dL (ref 65–99)
Globulin, Total: 2 g/dL (ref 1.5–4.5)
POTASSIUM: 4.9 mmol/L (ref 3.5–5.2)
SODIUM: 141 mmol/L (ref 134–144)
Total Protein: 6.5 g/dL (ref 6.0–8.5)

## 2018-09-16 LAB — CBC WITH DIFFERENTIAL/PLATELET
BASOS ABS: 0.1 10*3/uL (ref 0.0–0.2)
BASOS: 1 %
EOS (ABSOLUTE): 0.2 10*3/uL (ref 0.0–0.4)
Eos: 3 %
Hematocrit: 36 % (ref 34.0–46.6)
Hemoglobin: 12.4 g/dL (ref 11.1–15.9)
IMMATURE GRANULOCYTES: 0 %
Immature Grans (Abs): 0 10*3/uL (ref 0.0–0.1)
LYMPHS: 35 %
Lymphocytes Absolute: 2 10*3/uL (ref 0.7–3.1)
MCH: 31.9 pg (ref 26.6–33.0)
MCHC: 34.4 g/dL (ref 31.5–35.7)
MCV: 93 fL (ref 79–97)
MONOS ABS: 0.4 10*3/uL (ref 0.1–0.9)
Monocytes: 7 %
NEUTROS PCT: 54 %
Neutrophils Absolute: 3 10*3/uL (ref 1.4–7.0)
PLATELETS: 247 10*3/uL (ref 150–450)
RBC: 3.89 x10E6/uL (ref 3.77–5.28)
RDW: 12.2 % (ref 11.7–15.4)
WBC: 5.6 10*3/uL (ref 3.4–10.8)

## 2018-09-16 LAB — LIPID PANEL
CHOL/HDL RATIO: 2.1 ratio (ref 0.0–4.4)
Cholesterol, Total: 208 mg/dL — ABNORMAL HIGH (ref 100–199)
HDL: 99 mg/dL (ref >39)
LDL CALC: 99 mg/dL (ref 0–99)
Triglycerides: 52 mg/dL (ref 0–149)
VLDL Cholesterol Cal: 10 mg/dL (ref 5–40)

## 2018-09-16 LAB — VITAMIN D 25 HYDROXY (VIT D DEFICIENCY, FRACTURES): Vit D, 25-Hydroxy: 37.7 ng/mL (ref 30.0–100.0)

## 2018-09-16 LAB — TSH: TSH: 1.03 u[IU]/mL (ref 0.450–4.500)

## 2018-09-16 LAB — T4, FREE: FREE T4: 1.32 ng/dL (ref 0.82–1.77)

## 2018-09-16 LAB — HEMOGLOBIN A1C
Est. average glucose Bld gHb Est-mCnc: 120 mg/dL
Hgb A1c MFr Bld: 5.8 % — ABNORMAL HIGH (ref 4.8–5.6)

## 2018-09-16 LAB — LDL CHOLESTEROL, DIRECT: LDL Direct: 109 mg/dL — ABNORMAL HIGH (ref 0–99)

## 2018-11-05 ENCOUNTER — Other Ambulatory Visit: Payer: Self-pay | Admitting: Family Medicine

## 2018-11-05 DIAGNOSIS — M159 Polyosteoarthritis, unspecified: Secondary | ICD-10-CM

## 2018-11-05 DIAGNOSIS — M1990 Unspecified osteoarthritis, unspecified site: Secondary | ICD-10-CM

## 2018-11-05 DIAGNOSIS — M069 Rheumatoid arthritis, unspecified: Secondary | ICD-10-CM

## 2018-11-09 ENCOUNTER — Other Ambulatory Visit: Payer: Self-pay | Admitting: Family Medicine

## 2018-11-09 DIAGNOSIS — N951 Menopausal and female climacteric states: Secondary | ICD-10-CM

## 2018-11-09 DIAGNOSIS — N952 Postmenopausal atrophic vaginitis: Secondary | ICD-10-CM

## 2019-01-28 ENCOUNTER — Other Ambulatory Visit: Payer: Self-pay | Admitting: Family Medicine

## 2019-01-28 DIAGNOSIS — M1990 Unspecified osteoarthritis, unspecified site: Secondary | ICD-10-CM

## 2019-01-28 DIAGNOSIS — M159 Polyosteoarthritis, unspecified: Secondary | ICD-10-CM

## 2019-01-28 DIAGNOSIS — M069 Rheumatoid arthritis, unspecified: Secondary | ICD-10-CM

## 2019-01-29 ENCOUNTER — Other Ambulatory Visit: Payer: Self-pay | Admitting: Family Medicine

## 2019-01-29 DIAGNOSIS — N952 Postmenopausal atrophic vaginitis: Secondary | ICD-10-CM

## 2019-01-29 DIAGNOSIS — N951 Menopausal and female climacteric states: Secondary | ICD-10-CM

## 2019-02-19 ENCOUNTER — Other Ambulatory Visit: Payer: Self-pay | Admitting: Family Medicine

## 2019-02-19 DIAGNOSIS — E782 Mixed hyperlipidemia: Secondary | ICD-10-CM

## 2019-02-21 ENCOUNTER — Telehealth: Payer: Self-pay | Admitting: Obstetrics and Gynecology

## 2019-02-21 NOTE — Telephone Encounter (Signed)
Returned call to patient. Left message for her to call me.

## 2019-02-21 NOTE — Telephone Encounter (Signed)
Spoke with patient. She is complaining of vaginal spotting with wiping only x5days. Denies abdominal or vaginal pain or fever. She states she doesn't even need to wear a pad. 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 Made appointment with Dr.Jertson for 02-24-19 9:00am.

## 2019-02-21 NOTE — Telephone Encounter (Signed)
Patient is calling regarding abnormal bleeding.

## 2019-02-22 ENCOUNTER — Ambulatory Visit: Payer: BLUE CROSS/BLUE SHIELD | Admitting: Obstetrics and Gynecology

## 2019-02-22 NOTE — Progress Notes (Signed)
GYNECOLOGY  VISIT   HPI: 58 y.o.   Divorced Other or two or more races Hispanic or Latino  female   [phone] with No LMP recorded. Patient has had a hysterectomy.   here for vaginal bleeding, pelvic pain, and back pain that started 1 week ago. Denies urinary frequency, urgency, dysuria, fever, or chills. She only notices the blood when she wipes.  3 weeks ago she started having intermittent lower back pain. She c/o a vaginal pressure, discomfort, like something is coming out. She feels a bulge at the opening of her vagina.  She is very tender in her right groin, hasn't noticed a bulge.  She has a h/o a TVT and prior mesh erosion. Over the last year she has noticed an occasional spot of blood randomly. No pain or bleeding with sex.  She has had some issues with constipation, she has increased her fiber which is helping. She noticed the blood after she had been straining. She is having a BM every 1-2 days.  GYNECOLOGIC HISTORY: No LMP recorded. Patient has had a hysterectomy. Contraception: Hysterectomy Menopausal hormone therapy: Yuvafem        OB History    Gravida  4   Para  3   Term  3   Preterm      AB  1   Living  3     SAB  1   TAB      Ectopic      Multiple      Live Births  3              Patient Active Problem List   Diagnosis Date Noted  . Arthritis- bilateral elbows and bilateral knees 01/05/2018  . Generalized OA 01/05/2018  . Elevated hemoglobin A1c measurement 01/05/2018  . 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 Dense breast tissue 09/03/2017  . Osteopenia after menopause 09/03/2017  . Family history of malignant neoplasm of uterus 09/03/2017  . History of ovarian cyst 09/03/2017  . Carpal tunnel syndrome, bilateral 03/25/2017  . Vaginal dryness, menopausal 03/25/2017  . Rheumatoid arthritis involving both hands ([place]) 01/22/2017  . Chronic left shoulder pain 01/22/2017  . Chronic shoulder bursitis, left 01/22/2017  . Muscle strain 01/22/2017  .  Encounter for screening mammogram for breast cancer 01/22/2017  . Benign breast lumps- B/L FNA's - both Benign 08/27/2016  . Osteopenia-  q 2 yrs bone scan-  last one was bone density 2015 08/27/2016  . 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 Hyperlipidemia 08/27/2016  . Vaginal atrophy 08/27/2016    Past Medical History:  Diagnosis Date  . Benign breast lumps   . Hyperlipidemia   . Kidney stone   . Osteopenia     Past Surgical History:  Procedure Laterality Date  . ABDOMINOPLASTY    . APPENDECTOMY    . BREAST BIOPSY Bilateral   . BREAST LUMPECTOMY Bilateral   . CESAREAN SECTION     x 1  . CHOLECYSTECTOMY    . INCONTINENCE SURGERY     TVT  . NOSE SURGERY    . OVARIAN CYST REMOVAL    . TONSILLECTOMY    . TOTAL VAGINAL HYSTERECTOMY  2007   still has her tubes and ovaries  . WRIST SURGERY Right     Current Outpatient Medications  Medication Sig Dispense Refill  . Calcium Carbonate-Vit D-Min (CALCIUM 1200 PO) Take by mouth.    . Cyanocobalamin (VITAMIN B 12 PO) Take by mouth.    . meloxicam (MOBIC) 15 MG  tablet TAKE 1 TABLET BY MOUTH EVERY DAY 90 tablet 0  . Multiple Vitamins-Minerals (VITAMIN D3 COMPLETE PO) Take 5,000 Units by mouth daily.    . simvastatin (ZOCOR) 10 MG tablet TAKE 1 TABLET BY MOUTH EVERY DAY AT BEDTIME 30 tablet 0  . TURMERIC PO Take by mouth.    . vitamin E 400 UNIT capsule Take 400 Units by mouth daily.    Merril Abbe 10 MCG TABS vaginal tablet INSERT 1 TABLET TWICE A WEEK INTO VAGINA AS DIRECTED 24 tablet 0   No current facility-administered medications for this visit.      ALLERGIES: Patient has no known allergies.  Family History  Problem Relation Age of Onset  . Osteoporosis Mother   . Dementia Mother   . Osteopenia Mother   . Healthy Father   . Healthy Sister   . Healthy Brother   . Healthy Daughter   . Healthy Son   . Healthy Sister   . Healthy Sister   . Healthy Brother   . Healthy Son   . Uterine cancer Maternal Grandmother      Social History   Socioeconomic History  . Marital status: Divorced    Spouse name: Not on file  . Number of children: Not on file  . Years of education: Not on file  . Highest education level: Not on file  Occupational History  . Not on file  Social Needs  . Financial resource strain: Not on file  . Food insecurity    Worry: Not on file    Inability: Not on file  . Transportation needs    Medical: Not on file    Non-medical: Not on file  Tobacco Use  . Smoking status: Former Smoker    Packs/day: 0.25    Years: 4.00    Pack years: 1.00    Quit date: 05/02/1996    Years since quitting: 22.8  . Smokeless tobacco: Never Used  Substance and Sexual Activity  . Alcohol use: Yes    Alcohol/week: 4.0 standard drinks    Types: 4 Glasses of wine per week  . Drug use: No  . Sexual activity: Yes    Partners: Male    Birth control/protection: Surgical  Lifestyle  . Physical activity    Days per week: Not on file    Minutes per session: Not on file  . Stress: Not on file  Relationships  . Social Herbalist on phone: Not on file    Gets together: Not on file    Attends religious service: Not on file    Active member of club or organization: Not on file    Attends meetings of clubs or organizations: Not on file    Relationship status: Not on file  . Intimate partner violence    Fear of current or ex partner: Not on file    Emotionally abused: Not on file    Physically abused: Not on file    Forced sexual activity: Not on file  Other Topics Concern  . Not on file  Social History Narrative  . Not on file    Review of Systems  Constitutional: Negative.        Cold intolerance  HENT: Negative.   Eyes: Negative.   Respiratory: Negative.   Cardiovascular: Negative.   Gastrointestinal: Negative.   Genitourinary:       Vaginal bleeding Pelvic pain  Musculoskeletal: Positive for back pain.  Skin: Negative.   Neurological: Negative.  Endo/Heme/Allergies:  Negative.   Psychiatric/Behavioral: Negative.     PHYSICAL EXAMINATION:    BP 118/82 (BP Location: Right Arm, Patient Position: Sitting, Cuff Size: Normal)   Pulse 64   Temp 98 F (36.7 C) (Skin)   Wt 129 lb 6.4 oz (58.7 kg)   BMI 24.45 kg/m     General appearance: alert, cooperative and appears stated age Lower back: not tender to palpation CVA: not tender Abdomen: soft, non-tender; non distended, no masses,  no organomegaly Right groin: very tender, no clear mass palpated (examined supine and standing)  Pelvic: External genitalia:  no lesions              Urethra:  normal appearing urethra with no masses, tenderness or lesions              Bartholins and Skenes: normal                 Vagina: there is a small area of mesh erosion in the anterior distal vagina, friable, some small granulation tissue. Granulation tissue treated with silver nitrate. No signs of prolapse with valsalva              Cervix: absent              Bimanual Exam:  Uterus:  uterus absent              Adnexa: no mass, fullness, tenderness                Chaperone was present for exam.  ASSESSMENT Pelvic pain, other than mesh erosion, normal pelvic exam. 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 The patient has been constipated and straining, recently bleeding, tender to palpation, friable, some granulation tissue Constipation, improving with increase in fiber Right groin pain, suspected inguinal hernia Back pain, not tender    PLAN Will use vaginal estrogen qhs x 8 days (extra pack of Yuvifem sent to the pharmacy) Discussed constipation Avoid heavy lifting and straining Referral to General surgery for evaluation of suspected inguinal hernia (information given) F/U in 2 weeks.   An After Visit Summary was printed and given to the patient.

## 2019-02-24 ENCOUNTER — Encounter: Payer: Self-pay | Admitting: Obstetrics and Gynecology

## 2019-02-24 ENCOUNTER — Ambulatory Visit: Payer: BC Managed Care – PPO | Admitting: Obstetrics and Gynecology

## 2019-02-24 VITALS — BP 118/82 | HR 64 | Temp 98.0°F | Wt 129.4 lb

## 2019-02-24 DIAGNOSIS — K5900 Constipation, unspecified: Secondary | ICD-10-CM | POA: Diagnosis not present

## 2019-02-24 DIAGNOSIS — M545 Low back pain, unspecified: Secondary | ICD-10-CM

## 2019-02-24 DIAGNOSIS — T83711S Erosion of implanted vaginal mesh and other prosthetic materials to surrounding organ or tissue, sequela: Secondary | ICD-10-CM | POA: Diagnosis not present

## 2019-02-24 DIAGNOSIS — N939 Abnormal uterine and vaginal bleeding, unspecified: Secondary | ICD-10-CM

## 2019-02-24 DIAGNOSIS — R102 Pelvic and perineal pain unspecified side: Secondary | ICD-10-CM

## 2019-02-24 DIAGNOSIS — R1031 Right lower quadrant pain: Secondary | ICD-10-CM | POA: Diagnosis not present

## 2019-02-24 LAB — POCT URINALYSIS DIPSTICK
Bilirubin, UA: NEGATIVE
Blood, UA: POSITIVE
Glucose, UA: NEGATIVE
Ketones, UA: NEGATIVE
Nitrite, UA: NEGATIVE
Protein, UA: NEGATIVE
Spec Grav, UA: 1.01 (ref 1.010–1.025)
Urobilinogen, UA: 0.2 E.U./dL
pH, UA: 5 (ref 5.0–8.0)

## 2019-02-24 MED ORDER — ESTRADIOL 10 MCG VA TABS: ORAL_TABLET | VAGINAL | 11 refills | Status: DC

## 2019-02-24 NOTE — Patient Instructions (Signed)
About Constipation  Constipation Overview Constipation is the most common gastrointestinal complaint - about 4 million Americans experience constipation and make 2.5 million physician visits a year to get help for the problem.  Constipation can occur when the colon absorbs too much water, the colon's muscle contraction is slow or sluggish, and/or there is delayed transit time through the colon.  The result is stool that is hard and dry.  Indicators of constipation include straining during bowel movements greater than 25% of the time, having fewer than three bowel movements per week, and/or the feeling of incomplete evacuation.  There are established guidelines ([place] II ) for defining constipation. A person needs to have two or more of the following symptoms for at least 12 weeks (not necessarily consecutive) in the preceding 12 months: . Straining in  greater than 25% of bowel movements . Lumpy or hard stools in greater than 25% of bowel movements . Sensation of incomplete emptying in greater than 25% of bowel movements . Sensation of anorectal obstruction/blockade in greater than 25% of bowel movements . Manual maneuvers to help empty greater than 25% of bowel movements (e.g., digital evacuation, support of the pelvic floor)  . Less than  3 bowel movements/week . Loose stools are not present, and criteria for irritable bowel syndrome are insufficient  Common Causes of Constipation . Lack of fiber in your diet . Lack of physical activity . Medications, including iron and calcium supplements  . Dairy intake . Dehydration . Abuse of laxatives  Travel  Irritable Bowel Syndrome  Pregnancy  Luteal phase of menstruation (after ovulation and before menses)  Colorectal problems  Intestinal Dysfunction  Treating Constipation  There are several ways of treating constipation, including changes to diet and exercise, use of laxatives, adjustments to the pelvic floor, and scheduled toileting.   These treatments include: . increasing fiber and fluids in the diet  . increasing physical activity . learning muscle coordination   learning proper toileting techniques and toileting modifications   designing and sticking  to a toileting schedule     2007, [employer] Doc.22Hernia inguinal en los adultos Inguinal Hernia, Adult Una hernia inguinal se produce cuando la grasa o los intestinos empujan a travs de una zona dbil de un msculo donde se unen la pierna y el abdomen inferior (ingle). Esto crea un bulto. Este tipo de hernia tambin puede aparecer en:  En el escroto, si es varn.  En los pliegues de la piel alrededor de la vagina, si es [place]. Existen tres tipos de hernias inguinales:  Hernias que se pueden empujar hacia adentro del abdomen (son reducibles). Este tipo de hernias rara vez provoca dolor.  Hernias que no son reducibles (estn encarceladas).  Hernias que no son reducibles y pierden la irrigacin sangunea (estn estranguladas). Este tipo de hernia requiere ciruga de Freight forwarder. Cules son las causas? Esta afeccin se produce cuando tiene una zona dbil en los msculos o en los tejidos de la ingle. Esta zona dbil se desarrolla con el transcurso del tiempo. La hernia puede salirse por la zona dbil cuando ejerce un esfuerzo repentino [employer] abdominales inferiores, por ejemplo, en los siguientes casos:  Levanta un objeto pesado.  Hace esfuerzos para defecar. El estreimiento puede llevar a Materials engineer un gran esfuerzo.  Tos. Qu incrementa el riesgo? Es ms probable que Orthoptist en:  Los hombres.  Las [employer].  Personas que: ? Tiene sobrepeso. ? Realizan trabajos que requieren Haematologist de pie  durante largos perodos o levantar cargas pesadas. ? Han tenido una hernia inguinal previamente. ? Fuman o tienen una enfermedad pulmonar. Estos factores pueden causar una tos duradera (crnica).  Cules son los signos o los sntomas? Los sntomas pueden depender del tamao de la hernia. Con frecuencia, una pequea hernia inguinal no tiene sntomas. Estos son algunos de los sntomas de una hernia ms grande:  Un bulto en la zona de la ingle. Este es fcil de ver cuando se encuentra de pie. Podra no ser visible si se encuentra acostado.  Dolor o ardor en la ingle. Esto podra empeorar cuando levanta un objeto, realiza un esfuerzo o tose.  Un dolor sordo o una sensacin de presin en la ingle.  Un bulto inusual en el escroto del hombre. Los sntomas de una hernia inguinal estrangulada pueden incluir lo siguiente:  Un bulto en la ingle que es muy doloroso y sensible al tacto.  Un bulto que se torna de color rojo o prpura.  Cristy Hilts, nuseas y vmitos.  Imposibilidad de defecar o eliminar gases. Cmo se diagnostica? Esta afeccin se diagnostica en funcin de los sntomas, los antecedentes mdicos y un examen fsico. El mdico puede examinar la zona de la ingle y pedirle que tosa. Cmo se trata? El tratamiento depende del tamao de la hernia y de si tiene sntomas o no. Si no tiene sntomas, el mdico podr indicarle que controle cuidadosamente la hernia y que concurra a las visitas de control. Si tiene sntomas o una hernia grande, es posible que necesite ciruga para repararla. Siga estas indicaciones en su casa: Estilo de vida  Evite levantar objetos pesados.  Intente no estar de pie [employer].  No consuma ningn producto que contenga nicotina o tabaco, como cigarrillos y Psychologist, sport and exercise. Si necesita ayuda para dejar de fumar, consulte al mdico.  Mantenga un peso saludable. Prevencin del estreimiento  Tome medidas para Contractor. El estreimiento obliga a Optometrist esfuerzos para Landscape architect, los cuales pueden agravar una hernia o causar la rotura de la reparacin. El mdico puede recomendarle que: ? Beba suficiente lquido para mantener la  orina de color amarillo plido. ? Consuma alimentos ricos en fibra, como frutas y verduras frescas, cereales integrales y frijoles. ? Limite el consumo de alimentos ricos en grasa y azcares procesados, como los alimentos fritos o dulces. ? Tome medicamentos recetados o de venta libre para el estreimiento. Instrucciones generales  Puede intentar colocar la hernia en su lugar ejerciendo sobre esta una presin muy suave mientras est acostado. No intente forzar el bulto hacia adentro nuevamente si este no entra fcilmente.  Observe si la hernia cambia de forma, de color o de tamao. Solicite ayuda de inmediato si observa algn cambio.  Tome los medicamentos de venta libre y los recetados solamente como se lo haya indicado el mdico.  Consulting civil engineer a todas las visitas de control como se lo haya indicado el mdico. Esto es importante. Comunquese con un mdico si:  Tiene fiebre.  Presenta nuevos sntomas.  Los sntomas empeoran. Solicite ayuda de inmediato si:  Tiene dolor en la ingle que comienza repentinamente y [place].  Tiene un bulto en la ingle que tiene las siguientes caractersticas: ? Se agranda de repente y no se encoge. ? Se vuelve rojo o prpura y causa dolor al tacto.  Si es hombre y tiene Clinical cytogeneticist repentino en el escroto o el tamao de este cambia de repente.  No puede volver a Public affairs consultant hernia en su lugar al [employer]  sobre esta una presin muy suave mientras est acostado. No intente forzar el bulto hacia adentro nuevamente si este no entra fcilmente.  Tiene nuseas o vmitos que no desaparecen.  La frecuencia cardaca est acelerada.  Tiene dificultad para defecar o eliminar gases. Estos sntomas pueden representar un problema grave que constituye Engineer, maintenance (IT). No espere hasta que los sntomas desaparezcan. Solicite atencin mdica de inmediato. Comunquese con el servicio de emergencias de su localidad (911 en los [place]). Resumen  Una hernia inguinal se  produce cuando la grasa o los intestinos empujan a travs de una zona dbil de un msculo donde se unen la pierna y el abdomen inferior (ingle).  Esta afeccin ocurre cuando tiene una zona dbil en los msculos o en los tejidos de la ingle.  Los sntomas pueden depender del tamao de la hernia, y pueden incluir dolor o hinchazn de la ingle. Con frecuencia, una pequea hernia inguinal no tiene sntomas.  Si no tiene sntomas, es posible que no necesite tratamiento. Si tiene sntomas o una hernia grande, es posible que necesite ciruga para reparar la hernia.  Evite levantar objetos pesados. Tambin evite estar de pie durante mucho tiempo. Esta informacin no tiene Marine scientist el consejo del mdico. Asegrese de hacerle al mdico cualquier pregunta que tenga. Document Released: 12/13/2012 Document Revised: 10/11/2017 Document Reviewed: 08/05/2017 Elsevier Interactive Patient Education  2019 [employer].

## 2019-02-26 ENCOUNTER — Other Ambulatory Visit: Payer: Self-pay | Admitting: Family Medicine

## 2019-02-26 DIAGNOSIS — M159 Polyosteoarthritis, unspecified: Secondary | ICD-10-CM

## 2019-02-26 DIAGNOSIS — M1990 Unspecified osteoarthritis, unspecified site: Secondary | ICD-10-CM

## 2019-02-26 DIAGNOSIS — M069 Rheumatoid arthritis, unspecified: Secondary | ICD-10-CM

## 2019-03-01 ENCOUNTER — Telehealth: Payer: Self-pay | Admitting: Obstetrics and Gynecology

## 2019-03-01 NOTE — Telephone Encounter (Signed)
Patient is calling to check on referral to general surgeon.

## 2019-03-01 NOTE — Telephone Encounter (Signed)
Spoke with patient and told her that Judson Roch at [facility] is working on the referral. I also gave her the phone for  [employer].

## 2019-03-01 NOTE — Telephone Encounter (Signed)
Routing to [employer]  for referral f/u.

## 2019-03-10 DIAGNOSIS — R1031 Right lower quadrant pain: Secondary | ICD-10-CM | POA: Diagnosis not present

## 2019-03-14 NOTE — Progress Notes (Signed)
GYNECOLOGY  VISIT   HPI: 58 y.o.   Divorced Other or two or more races Hispanic or Latino  female   [phone] with No LMP recorded. Patient has had a hysterectomy.   here for   2 week follow up. She was seen for vaginal bleeding, pelvic and back pain. H/O hysterectomy, TVT and prior mesh erosion. There was a small area of mesh erosion noted at her exam and an area of granulation tissue was treated with silver nitrate. She is on vaginal estrogen 2 x a week, was increased to daily for 8 days after her last visit. .  Patient states bleeding has stopped and pelvic pain resolved since her last visit. She has been struggling some with constipation, some help with increasing the fiber in her diet, some days still straining with BM. She is on average having one hard BM a day.   She was also referred to General surgery for right groin pain (concern for hernia).  He is setting her up for an ultrasound, told her he doesn't think is a hernia. Told her to take some ibuprofen. Still having pain in her groin, but it has improved.   GYNECOLOGIC HISTORY: No LMP recorded. Patient has had a hysterectomy. Contraception:hysterectomy Menopausal hormone therapy: yuvafem         OB History    Gravida  4   Para  3   Term  3   Preterm      AB  1   Living  3     SAB  1   TAB      Ectopic      Multiple      Live Births  3              Patient Active Problem List   Diagnosis Date Noted  . Arthritis- bilateral elbows and bilateral knees 01/05/2018  . Generalized OA 01/05/2018  . Elevated hemoglobin A1c measurement 01/05/2018  . 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 Dense breast tissue 09/03/2017  . Osteopenia after menopause 09/03/2017  . Family history of malignant neoplasm of uterus 09/03/2017  . History of ovarian cyst 09/03/2017  . Carpal tunnel syndrome, bilateral 03/25/2017  . Vaginal dryness, menopausal 03/25/2017  . Rheumatoid arthritis involving both hands ([place]) 01/22/2017  . Chronic  left shoulder pain 01/22/2017  . Chronic shoulder bursitis, left 01/22/2017  . Muscle strain 01/22/2017  . Encounter for screening mammogram for breast cancer 01/22/2017  . Benign breast lumps- B/L FNA's - both Benign 08/27/2016  . Osteopenia-  q 2 yrs bone scan-  last one was bone density 2015 08/27/2016  . 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 Hyperlipidemia 08/27/2016  . Vaginal atrophy 08/27/2016    Past Medical History:  Diagnosis Date  . Benign breast lumps   . Hyperlipidemia   . Kidney stone   . Osteopenia     Past Surgical History:  Procedure Laterality Date  . ABDOMINOPLASTY    . APPENDECTOMY    . BREAST BIOPSY Bilateral   . BREAST LUMPECTOMY Bilateral   . CESAREAN SECTION     x 1  . CHOLECYSTECTOMY    . INCONTINENCE SURGERY     TVT  . NOSE SURGERY    . OVARIAN CYST REMOVAL    . TONSILLECTOMY    . TOTAL VAGINAL HYSTERECTOMY  2007   still has her tubes and ovaries  . WRIST SURGERY Right     Current Outpatient Medications  Medication Sig Dispense Refill  . Calcium Carbonate-Vit  D-Min (CALCIUM 1200 PO) Take by mouth.    . Cyanocobalamin (VITAMIN B 12 PO) Take by mouth.    . meloxicam (MOBIC) 15 MG tablet TAKE 1 TABLET BY MOUTH EVERY DAY 90 tablet 0  . Multiple Vitamins-Minerals (VITAMIN D3 COMPLETE PO) Take 5,000 Units by mouth daily.    . simvastatin (ZOCOR) 10 MG tablet TAKE 1 TABLET BY MOUTH EVERY DAY AT BEDTIME 30 tablet 0  . TURMERIC PO Take by mouth.    . vitamin E 400 UNIT capsule Take 400 Units by mouth daily.    Merril Abbe 10 MCG TABS vaginal tablet INSERT 1 TABLET TWICE A WEEK INTO VAGINA AS DIRECTED 24 tablet 0   No current facility-administered medications for this visit.      ALLERGIES: Patient has no known allergies.  Family History  Problem Relation Age of Onset  . Osteoporosis Mother   . Dementia Mother   . Osteopenia Mother   . Healthy Father   . Healthy Sister   . Healthy Brother   . Healthy Daughter   . Healthy Son   .  Healthy Sister   . Healthy Sister   . Healthy Brother   . Healthy Son   . Uterine cancer Maternal Grandmother     Social History   Socioeconomic History  . Marital status: Divorced    Spouse name: Not on file  . Number of children: Not on file  . Years of education: Not on file  . Highest education level: Not on file  Occupational History  . Not on file  Social Needs  . Financial resource strain: Not on file  . Food insecurity    Worry: Not on file    Inability: Not on file  . Transportation needs    Medical: Not on file    Non-medical: Not on file  Tobacco Use  . Smoking status: Former Smoker    Packs/day: 0.25    Years: 4.00    Pack years: 1.00    Quit date: 05/02/1996    Years since quitting: 22.8  . Smokeless tobacco: Never Used  Substance and Sexual Activity  . Alcohol use: Yes    Alcohol/week: 4.0 standard drinks    Types: 4 Glasses of wine per week  . Drug use: No  . Sexual activity: Yes    Partners: Male    Birth control/protection: Surgical  Lifestyle  . Physical activity    Days per week: Not on file    Minutes per session: Not on file  . Stress: Not on file  Relationships  . Social Herbalist on phone: Not on file    Gets together: Not on file    Attends religious service: Not on file    Active member of club or organization: Not on file    Attends meetings of clubs or organizations: Not on file    Relationship status: Not on file  . Intimate partner violence    Fear of current or ex partner: Not on file    Emotionally abused: Not on file    Physically abused: Not on file    Forced sexual activity: Not on file  Other Topics Concern  . Not on file  Social History Narrative  . Not on file    Review of Systems  Constitutional: Negative.   HENT: Negative.   Eyes: Negative.   Respiratory: Negative.   Cardiovascular: Negative.   Gastrointestinal: Negative.   Genitourinary: Negative.   Musculoskeletal: Negative.  Skin: Negative.    Neurological: Negative.   Endo/Heme/Allergies: Negative.   Psychiatric/Behavioral: Negative.     PHYSICAL EXAMINATION:    BP 122/76 (BP Location: Right Arm, Patient Position: Sitting, Cuff Size: Normal)   Pulse 92   Temp 98.5 F (36.9 C) (Axillary)   Resp 14   Ht 5\' 2"  (1.575 m)   Wt 130 lb 4 oz (59.1 kg)   BMI 23.82 kg/m     General appearance: alert, cooperative and appears stated age  Pelvic: External genitalia:  no lesions              Urethra:  normal appearing urethra with no masses, tenderness or lesions              Bartholins and Skenes: normal                 Vagina: normal appearing vagina with normal color and discharge. There              Cervix: absent              Bimanual Exam:  Uterus:  uterus absent              Adnexa: no mass, fullness, tenderness               Chaperone was present for exam.  ASSESSMENT Vaginal mesh erosion, episode of bleeding. Bleeding has stopped with a short increase in vaginal estrogen, no more granulation tissue. No vaginal pain or dyspareunia. Vaginal atrophy, helped with vaginal estrogen Constipation    PLAN Continue vaginal estrogen 2 x a week Call with further vaginal bleeding Discussed constipation. Recommended she increase her intake of fruit, fiber and fluids. Discussed using metamucil or miralax  Addendum: after her visit she noted that some of her stools are black. IFOB given   An After Visit Summary was printed and given to the patient.

## 2019-03-16 ENCOUNTER — Ambulatory Visit: Payer: BC Managed Care – PPO | Admitting: Obstetrics and Gynecology

## 2019-03-16 ENCOUNTER — Other Ambulatory Visit: Payer: Self-pay

## 2019-03-16 ENCOUNTER — Encounter: Payer: Self-pay | Admitting: Obstetrics and Gynecology

## 2019-03-16 VITALS — BP 122/76 | HR 92 | Temp 98.5°F | Resp 14 | Ht 62.0 in | Wt 130.2 lb

## 2019-03-16 DIAGNOSIS — K5900 Constipation, unspecified: Secondary | ICD-10-CM

## 2019-03-16 DIAGNOSIS — T83711S Erosion of implanted vaginal mesh and other prosthetic materials to surrounding organ or tissue, sequela: Secondary | ICD-10-CM | POA: Diagnosis not present

## 2019-03-16 DIAGNOSIS — K921 Melena: Secondary | ICD-10-CM | POA: Diagnosis not present

## 2019-03-16 DIAGNOSIS — N952 Postmenopausal atrophic vaginitis: Secondary | ICD-10-CM | POA: Diagnosis not present

## 2019-03-16 NOTE — Patient Instructions (Signed)
Consider trying metamucil or miralax for constipation   About Constipation  Constipation Overview Constipation is the most common gastrointestinal complaint - about 4 million Americans experience constipation and make 2.5 million physician visits a year to get help for the problem.  Constipation can occur when the colon absorbs too much water, the colon's muscle contraction is slow or sluggish, and/or there is delayed transit time through the colon.  The result is stool that is hard and dry.  Indicators of constipation include straining during bowel movements greater than 25% of the time, having fewer than three bowel movements per week, and/or the feeling of incomplete evacuation.  There are established guidelines ([place] II ) for defining constipation. A person needs to have two or more of the following symptoms for at least 12 weeks (not necessarily consecutive) in the preceding 12 months: . Straining in  greater than 25% of bowel movements . Lumpy or hard stools in greater than 25% of bowel movements . Sensation of incomplete emptying in greater than 25% of bowel movements . Sensation of anorectal obstruction/blockade in greater than 25% of bowel movements . Manual maneuvers to help empty greater than 25% of bowel movements (e.g., digital evacuation, support of the pelvic floor)  . Less than  3 bowel movements/week . Loose stools are not present, and criteria for irritable bowel syndrome are insufficient  Common Causes of Constipation . Lack of fiber in your diet . Lack of physical activity . Medications, including iron and calcium supplements  . Dairy intake . Dehydration . Abuse of laxatives  Travel  Irritable Bowel Syndrome  Pregnancy  Luteal phase of menstruation (after ovulation and before menses)  Colorectal problems  Intestinal Dysfunction  Treating Constipation  There are several ways of treating constipation, including changes to diet and exercise, use of laxatives,  adjustments to the pelvic floor, and scheduled toileting.  These treatments include: . increasing fiber and fluids in the diet  . increasing physical activity . learning muscle coordination   learning proper toileting techniques and toileting modifications   designing and sticking  to a toileting schedule     2007, [employer] Doc.22

## 2019-03-20 ENCOUNTER — Other Ambulatory Visit: Payer: Self-pay | Admitting: Family Medicine

## 2019-03-20 DIAGNOSIS — E782 Mixed hyperlipidemia: Secondary | ICD-10-CM

## 2019-03-21 ENCOUNTER — Other Ambulatory Visit: Payer: Self-pay | Admitting: General Surgery

## 2019-03-21 DIAGNOSIS — K921 Melena: Secondary | ICD-10-CM | POA: Diagnosis not present

## 2019-03-21 DIAGNOSIS — R1031 Right lower quadrant pain: Secondary | ICD-10-CM

## 2019-03-22 LAB — FECAL OCCULT BLOOD, IMMUNOCHEMICAL: Fecal Occult Bld: NEGATIVE

## 2019-03-22 LAB — SPECIMEN STATUS REPORT

## 2019-03-25 ENCOUNTER — Other Ambulatory Visit: Payer: Self-pay

## 2019-03-25 ENCOUNTER — Ambulatory Visit
Admission: RE | Admit: 2019-03-25 | Discharge: 2019-03-25 | Disposition: A | Discharge status: Home / Self Care | Payer: BC Managed Care – PPO | Source: Ambulatory Visit | Attending: General Surgery | Admitting: General Surgery

## 2019-03-25 DIAGNOSIS — R1031 Right lower quadrant pain: Secondary | ICD-10-CM

## 2019-03-25 DIAGNOSIS — K4090 Unilateral inguinal hernia, without obstruction or gangrene, not specified as recurrent: Secondary | ICD-10-CM | POA: Diagnosis not present

## 2019-04-06 ENCOUNTER — Telehealth: Payer: Self-pay | Admitting: Obstetrics and Gynecology

## 2019-04-06 NOTE — Telephone Encounter (Signed)
Patient calling for results of ultrasound from 7/24.

## 2019-04-06 NOTE — Telephone Encounter (Signed)
Call to patient. Advised patient that Dr. Rosendo Gros was the ordering physician of the ultrasound done on 03-25-2019. She will contact his office to follow up for results.   Routing to provider and will close encounter.

## 2019-04-15 ENCOUNTER — Ambulatory Visit: Payer: Self-pay | Admitting: General Surgery

## 2019-04-15 DIAGNOSIS — K4090 Unilateral inguinal hernia, without obstruction or gangrene, not specified as recurrent: Secondary | ICD-10-CM | POA: Diagnosis not present

## 2019-04-15 NOTE — H&P (View-Only) (Signed)
History of Present Illness Rachael Ok MD; 04/15/2019 8:53 AM) The patient is a 58 year old female who presents with abdominal pain. Patient returns today secondary to continued right inguinal pain. Patient underwent ultrasound which I reviewed personally. Ultrasound does reveal right-sided likely fat-containing hernia. Patient states this continues with pain with lifting. She states that her her job includes a lot of lifting of boxes. Patient still notices no bulge.   --------------------------- Patient is a 58 year old Spanish-speaking female who is referred by Dr. Raliegh Clark for chief complaint of right inguinal pain. Patient has a past medical history significant for hypercholesterolemia. Patient states that she's had pain recently to the right inguinal area. She states that it is fairly sharp in nature. She states that it does radiate to her hip as well as her lateral thigh. She also stated she feels it radiates to her lower back. Patient states that she feels this when she is laying down as well as up on her feet. She states that she does do some bending over and lifting with her job. She has not noticed any bulges to the right inguinal area. Patient denies any previous back injuries or back pain in the past.  She's had a previous open appendectomy, open cholecystectomy, and C-section lower midline incision.   Allergies Rachael Coke, RN; 04/15/2019 8:42 AM) No Known Drug Allergies  [03/10/2019]: Allergies Reconciled   Medication History (Rachael Herrin, RN; 04/15/2019 8:42 AM) Mobic (15MG  Tablet, Oral) Active. Estradiol (10MCG Tablet, Vaginal) Active. Zocor (10MG  Tablet, Oral) Active. Calcium (500MG  Tablet, Oral) Active. Cyanocobalamin (1000MCG/ML Solution, Injection) Active. Vitamin E (400UNIT Capsule, Oral) Active. Medications Reconciled    Review of Systems Rachael Ok, MD; 04/15/2019 8:56 AM) General Present- Chills. Not Present- Appetite Loss, Fatigue,  Fever, Night Sweats, Weight Gain and Weight Loss. Skin Not Present- Change in Wart/Mole, Dryness, Hives, Jaundice, New Lesions, Non-Healing Wounds, Rash and Ulcer. HEENT Not Present- Earache, Hearing Loss, Hoarseness, Nose Bleed, Oral Ulcers, Ringing in the Ears, Seasonal Allergies, Sinus Pain, Sore Throat, Visual Disturbances, Wears glasses/contact lenses and Yellow Eyes. Respiratory Not Present- Bloody sputum, Chronic Cough, Difficulty Breathing, Snoring and Wheezing. Breast Not Present- Breast Mass, Breast Pain, Nipple Discharge and Skin Changes. Cardiovascular Not Present- Chest Pain, Difficulty Breathing Lying Down, Leg Cramps, Palpitations, Rapid Heart Rate, Shortness of Breath and Swelling of Extremities. Gastrointestinal Present- Bloating, Change in Bowel Habits, Constipation, Indigestion and Nausea. Not Present- Abdominal Pain, Bloody Stool, Chronic diarrhea, Difficulty Swallowing, Excessive gas, Gets full quickly at meals, Hemorrhoids, Rectal Pain and Vomiting. Female Genitourinary Present- Pelvic Pain. Not Present- Frequency, Nocturia, Painful Urination and Urgency. Musculoskeletal Present- Back Pain, Joint Pain and Joint Stiffness. Not Present- Muscle Pain, Muscle Weakness and Swelling of Extremities. Neurological Not Present- Decreased Memory, Fainting, Headaches, Numbness, Seizures, Tingling, Tremor, Trouble walking and Weakness. Psychiatric Not Present- Anxiety, Bipolar, Change in Sleep Pattern, Depression, Fearful and Frequent crying. Endocrine Present- Cold Intolerance and Hair Changes. Not Present- Excessive Hunger, Heat Intolerance, Hot flashes and New Diabetes. Hematology Not Present- Blood Thinners, Easy Bruising, Excessive bleeding, Gland problems, HIV and Persistent Infections.  Vitals (Rachael Herrin RN; 04/15/2019 8:42 AM) 04/15/2019 8:42 AM Weight: 128.13 lb Temp.: 97.57F  Pulse: 81 (Regular)  P.OX: 99% (Room air) BP: 128/80(Sitting, Left Arm,  Standard)       Physical Exam Rachael Ok, MD; 04/15/2019 8:56 AM) The physical exam findings are as follows: Note: Constitutional: No acute distress, conversant, appears stated age  Eyes: Anicteric sclerae, moist conjunctiva, no lid lag  Neck: No thyromegaly,  trachea midline, no cervical lymphadenopathy  Lungs: Clear to auscultation biilaterally, normal respiratory effot  Cardiovascular: regular rate & rhythm, no murmurs, no peripheal edema, pedal pulses 2+  GI: Soft, no masses or hepatosplenomegaly, non-tender to palpation no hernia in the right inguinal area on Valsalva  MSK: Normal gait, no clubbing cyanosis, edema  Skin: No rashes, palpation reveals normal skin turgor  Psychiatric: Appropriate judgment and insight, oriented to person, place, and time    Assessment & Plan Rachael Ok MD; 04/15/2019 8:56 AM)  RIGHT INGUINAL HERNIA (K40.90) Impression: Patient is a 58 year old female, with history of hyper-cholesterolemia, who comes in with a right-sided inguinal hernia. This appears to be fat-containing. Patient has had several areas open operations include open midline C-section, open appendectomy, open cholecystectomy. We'll attempt to proceed with laparoscopic right inguinal hernia repair with mesh. I did discussion of this potentially could require open. 1. The patient will like to proceed to the operating room for laparoscopic versus open inguinal hernia repair with mesh.  2. I discussed with the patient the signs and symptoms of incarceration and strangulation and the need to proceed to the ER should they occur.  3. I discussed with the patient the risks and benefits of the procedure to include but not limited to: Infection, bleeding, damage to surrounding structures, possible need for further surgery, possible nerve pain, and possible recurrence. The patient was understanding and wishes to proceed.

## 2019-04-15 NOTE — H&P (Signed)
History of Present Illness Ralene Ok MD; 04/15/2019 8:53 AM) The patient is a 58 year old female who presents with abdominal pain. Patient returns today secondary to continued right inguinal pain. Patient underwent ultrasound which I reviewed personally. Ultrasound does reveal right-sided likely fat-containing hernia. Patient states this continues with pain with lifting. She states that her her job includes a lot of lifting of boxes. Patient still notices no bulge.   --------------------------- Patient is a 58 year old Spanish-speaking female who is referred by Dr. Raliegh Scarlet for chief complaint of right inguinal pain. Patient has a past medical history significant for hypercholesterolemia. Patient states that she's had pain recently to the right inguinal area. She states that it is fairly sharp in nature. She states that it does radiate to her hip as well as her lateral thigh. She also stated she feels it radiates to her lower back. Patient states that she feels this when she is laying down as well as up on her feet. She states that she does do some bending over and lifting with her job. She has not noticed any bulges to the right inguinal area. Patient denies any previous back injuries or back pain in the past.  She's had a previous open appendectomy, open cholecystectomy, and C-section lower midline incision.   Allergies Lindwood Coke, RN; 04/15/2019 8:42 AM) No Known Drug Allergies  [03/10/2019]: Allergies Reconciled   Medication History (Diane Herrin, RN; 04/15/2019 8:42 AM) Mobic (15MG  Tablet, Oral) Active. Estradiol (10MCG Tablet, Vaginal) Active. Zocor (10MG  Tablet, Oral) Active. Calcium (500MG  Tablet, Oral) Active. Cyanocobalamin (1000MCG/ML Solution, Injection) Active. Vitamin E (400UNIT Capsule, Oral) Active. Medications Reconciled    Review of Systems Ralene Ok, MD; 04/15/2019 8:56 AM) General Present- Chills. Not Present- Appetite Loss, Fatigue,  Fever, Night Sweats, Weight Gain and Weight Loss. Skin Not Present- Change in Wart/Mole, Dryness, Hives, Jaundice, New Lesions, Non-Healing Wounds, Rash and Ulcer. HEENT Not Present- Earache, Hearing Loss, Hoarseness, Nose Bleed, Oral Ulcers, Ringing in the Ears, Seasonal Allergies, Sinus Pain, Sore Throat, Visual Disturbances, Wears glasses/contact lenses and Yellow Eyes. Respiratory Not Present- Bloody sputum, Chronic Cough, Difficulty Breathing, Snoring and Wheezing. Breast Not Present- Breast Mass, Breast Pain, Nipple Discharge and Skin Changes. Cardiovascular Not Present- Chest Pain, Difficulty Breathing Lying Down, Leg Cramps, Palpitations, Rapid Heart Rate, Shortness of Breath and Swelling of Extremities. Gastrointestinal Present- Bloating, Change in Bowel Habits, Constipation, Indigestion and Nausea. Not Present- Abdominal Pain, Bloody Stool, Chronic diarrhea, Difficulty Swallowing, Excessive gas, Gets full quickly at meals, Hemorrhoids, Rectal Pain and Vomiting. Female Genitourinary Present- Pelvic Pain. Not Present- Frequency, Nocturia, Painful Urination and Urgency. Musculoskeletal Present- Back Pain, Joint Pain and Joint Stiffness. Not Present- Muscle Pain, Muscle Weakness and Swelling of Extremities. Neurological Not Present- Decreased Memory, Fainting, Headaches, Numbness, Seizures, Tingling, Tremor, Trouble walking and Weakness. Psychiatric Not Present- Anxiety, Bipolar, Change in Sleep Pattern, Depression, Fearful and Frequent crying. Endocrine Present- Cold Intolerance and Hair Changes. Not Present- Excessive Hunger, Heat Intolerance, Hot flashes and New Diabetes. Hematology Not Present- Blood Thinners, Easy Bruising, Excessive bleeding, Gland problems, HIV and Persistent Infections.  Vitals (Diane Herrin RN; 04/15/2019 8:42 AM) 04/15/2019 8:42 AM Weight: 128.13 lb Temp.: 97.42F  Pulse: 81 (Regular)  P.OX: 99% (Room air) BP: 128/80(Sitting, Left Arm,  Standard)       Physical Exam Ralene Ok, MD; 04/15/2019 8:56 AM) The physical exam findings are as follows: Note: Constitutional: No acute distress, conversant, appears stated age  Eyes: Anicteric sclerae, moist conjunctiva, no lid lag  Neck: No thyromegaly,  trachea midline, no cervical lymphadenopathy  Lungs: Clear to auscultation biilaterally, normal respiratory effot  Cardiovascular: regular rate & rhythm, no murmurs, no peripheal edema, pedal pulses 2+  GI: Soft, no masses or hepatosplenomegaly, non-tender to palpation no hernia in the right inguinal area on Valsalva  MSK: Normal gait, no clubbing cyanosis, edema  Skin: No rashes, palpation reveals normal skin turgor  Psychiatric: Appropriate judgment and insight, oriented to person, place, and time    Assessment & Plan Ralene Ok MD; 04/15/2019 8:56 AM)  RIGHT INGUINAL HERNIA (K40.90) Impression: Patient is a 58 year old female, with history of hyper-cholesterolemia, who comes in with a right-sided inguinal hernia. This appears to be fat-containing. Patient has had several areas open operations include open midline C-section, open appendectomy, open cholecystectomy. We'll attempt to proceed with laparoscopic right inguinal hernia repair with mesh. I did discussion of this potentially could require open. 1. The patient will like to proceed to the operating room for laparoscopic versus open inguinal hernia repair with mesh.  2. I discussed with the patient the signs and symptoms of incarceration and strangulation and the need to proceed to the ER should they occur.  3. I discussed with the patient the risks and benefits of the procedure to include but not limited to: Infection, bleeding, damage to surrounding structures, possible need for further surgery, possible nerve pain, and possible recurrence. The patient was understanding and wishes to proceed.

## 2019-04-27 NOTE — Pre-Procedure Instructions (Addendum)
Rachael Clark  04/27/2019      [id number] - [address], [place] [address] Phone: [phone] Fax: [phone]    Your procedure is scheduled on Aug. 31  Report to [facility] Entrance A at 10:30 A.M.  Call this number if you have problems the morning of surgery:  [phone]   Remember:  Do not eat or drink after midnight.      Take these medicines the morning of surgery with A SIP OF WATER : none           7 days prior to surgery STOP taking any Aspirin (unless otherwise instructed by your surgeon), Aleve, Naproxen, Ibuprofen, Motrin, Advil, Goody's, BC's, all herbal medications, fish oil, and all vitamins.    Do not wear jewelry, make-up or nail polish.  Do not wear lotions, powders, or perfumes, or deodorant.  Do not shave 48 hours prior to surgery.  Men may shave face and neck.  Do not bring valuables to the hospital.  [facility] is not responsible for any belongings or valuables.                 [facility]- Preparing For Surgery  Before surgery, you can play an important role. Because skin is not sterile, your skin needs to be as free of germs as possible. You can reduce the number of germs on your skin by washing with CHG (chlorahexidine gluconate) Soap before surgery.  CHG is an antiseptic cleaner which kills germs and bonds with the skin to continue killing germs even after washing.    Oral Hygiene is also important to reduce your risk of infection.  Remember - BRUSH YOUR TEETH THE MORNING OF SURGERY WITH YOUR REGULAR TOOTHPASTE  Please do not use if you have an allergy to CHG or antibacterial soaps. If your skin becomes reddened/irritated stop using the CHG.  Do not shave (including legs and underarms) for at least 48 hours prior to first CHG shower. It is OK to shave your face.  Please follow these instructions carefully.   1. Shower the NIGHT BEFORE SURGERY and  the MORNING OF SURGERY with CHG.   2. If you chose to wash your hair, wash your hair first as usual with your normal shampoo.  3. After you shampoo, rinse your hair and body thoroughly to remove the shampoo.  4. Use CHG as you would any other liquid soap. You can apply CHG directly to the skin and wash gently with a scrungie or a clean washcloth.   5. Apply the CHG Soap to your body ONLY FROM THE NECK DOWN.  Do not use on open wounds or open sores. Avoid contact with your eyes, ears, mouth and genitals (private parts). Wash Face and genitals (private parts)  with your normal soap.  6. Wash thoroughly, paying special attention to the area where your surgery will be performed.  7. Thoroughly rinse your body with warm water from the neck down.  8. DO NOT shower/wash with your normal soap after using and rinsing off the CHG Soap.  9. Pat yourself dry with a CLEAN TOWEL.  10. Wear CLEAN PAJAMAS to bed the night before surgery, wear comfortable clothes the morning of surgery  11. Place CLEAN SHEETS on your bed the night of your first shower and DO NOT SLEEP WITH PETS.    Day of Surgery:  Do not apply any deodorants/lotions.  Please wear clean clothes to the hospital/surgery center.   Remember to brush your teeth WITH YOUR REGULAR TOOTHPASTE.    Contacts, dentures or bridgework may not be worn into surgery.  Leave your suitcase in the car.  After surgery it may be brought to your room.  For patients admitted to the hospital, discharge time will be determined by your treatment team.  Patients discharged the day of surgery will not be allowed to drive home.   Special instructions: 7 days prior to surgery STOP taking any Aspirin (unless otherwise instructed by your surgeon), Aleve, Naproxen, Ibuprofen, Motrin, Advil, Goody's, BC's, all herbal medications, fish oil, and all vitamins.  Please read over the following fact sheets that you were given. Coughing and Deep Breathing and  Surgical Site Infection Prevention

## 2019-04-28 ENCOUNTER — Other Ambulatory Visit (HOSPITAL_COMMUNITY)
Admission: RE | Admit: 2019-04-28 | Discharge: 2019-04-28 | Disposition: A | Discharge status: Home / Self Care | Payer: BC Managed Care – PPO | Source: Ambulatory Visit | Attending: General Surgery | Admitting: General Surgery

## 2019-04-28 ENCOUNTER — Encounter (HOSPITAL_COMMUNITY)
Admission: RE | Admit: 2019-04-28 | Discharge: 2019-04-28 | Disposition: A | Discharge status: Home / Self Care | Payer: BC Managed Care – PPO | Source: Ambulatory Visit | Attending: General Surgery | Admitting: General Surgery

## 2019-04-28 ENCOUNTER — Other Ambulatory Visit: Payer: Self-pay

## 2019-04-28 ENCOUNTER — Encounter (HOSPITAL_COMMUNITY): Payer: Self-pay

## 2019-04-28 DIAGNOSIS — Z20828 Contact with and (suspected) exposure to other viral communicable diseases: Secondary | ICD-10-CM | POA: Insufficient documentation

## 2019-04-28 DIAGNOSIS — K4090 Unilateral inguinal hernia, without obstruction or gangrene, not specified as recurrent: Secondary | ICD-10-CM | POA: Diagnosis not present

## 2019-04-28 DIAGNOSIS — Z01812 Encounter for preprocedural laboratory examination: Secondary | ICD-10-CM | POA: Diagnosis not present

## 2019-04-28 HISTORY — DX: Other specified postprocedural states: Z98.890

## 2019-04-28 HISTORY — DX: Nausea with vomiting, unspecified: R11.2

## 2019-04-28 LAB — CBC
HCT: 40.7 % (ref 36.0–46.0)
Hemoglobin: 13.5 g/dL (ref 12.0–15.0)
MCH: 32.1 pg (ref 26.0–34.0)
MCHC: 33.2 g/dL (ref 30.0–36.0)
MCV: 96.7 fL (ref 80.0–100.0)
Platelets: 248 10*3/uL (ref 150–400)
RBC: 4.21 MIL/uL (ref 3.87–5.11)
RDW: 12.4 % (ref 11.5–15.5)
WBC: 7.4 10*3/uL (ref 4.0–10.5)
nRBC: 0 % (ref 0.0–0.2)

## 2019-04-28 LAB — SARS CORONAVIRUS 2 (TAT 6-24 HRS): SARS Coronavirus 2: NEGATIVE

## 2019-04-28 NOTE — Progress Notes (Signed)
PCP - Mellody Dance Cardiologist - na  Chest x-ray - na EKG - na Stress Test - na ECHO - na Cardiac Cath - na  Sleep Study - na CPAP -   Fasting Blood Sugar - na Checks Blood Sugar _____ times a day  Blood Thinner Instructions: Aspirin Instructions:na  Anesthesia review:   Patient denies shortness of breath, fever, cough and chest pain at PAT appointment   Patient verbalized understanding of instructions that were given to them at the PAT appointment. Patient was also instructed that they will need to review over the PAT instructions again at home before surgery.

## 2019-05-02 ENCOUNTER — Ambulatory Visit (HOSPITAL_COMMUNITY): Payer: BC Managed Care – PPO | Admitting: Anesthesiology

## 2019-05-02 ENCOUNTER — Ambulatory Visit (HOSPITAL_COMMUNITY)
Admission: RE | Admit: 2019-05-02 | Discharge: 2019-05-02 | Disposition: A | Discharge status: Home / Self Care | Payer: BC Managed Care – PPO | Source: Ambulatory Visit | Attending: General Surgery | Admitting: General Surgery

## 2019-05-02 ENCOUNTER — Other Ambulatory Visit: Payer: Self-pay

## 2019-05-02 ENCOUNTER — Encounter (HOSPITAL_COMMUNITY)
Admission: RE | Disposition: A | Discharge status: Home / Self Care | Payer: Self-pay | Source: Ambulatory Visit | Attending: General Surgery

## 2019-05-02 DIAGNOSIS — K4090 Unilateral inguinal hernia, without obstruction or gangrene, not specified as recurrent: Secondary | ICD-10-CM | POA: Diagnosis not present

## 2019-05-02 DIAGNOSIS — Z791 Long term (current) use of non-steroidal anti-inflammatories (NSAID): Secondary | ICD-10-CM | POA: Insufficient documentation

## 2019-05-02 DIAGNOSIS — Z79899 Other long term (current) drug therapy: Secondary | ICD-10-CM | POA: Diagnosis not present

## 2019-05-02 DIAGNOSIS — E785 Hyperlipidemia, unspecified: Secondary | ICD-10-CM | POA: Diagnosis not present

## 2019-05-02 DIAGNOSIS — G709 Myoneural disorder, unspecified: Secondary | ICD-10-CM | POA: Diagnosis not present

## 2019-05-02 DIAGNOSIS — E7800 Pure hypercholesterolemia, unspecified: Secondary | ICD-10-CM | POA: Insufficient documentation

## 2019-05-02 DIAGNOSIS — M1990 Unspecified osteoarthritis, unspecified site: Secondary | ICD-10-CM | POA: Diagnosis not present

## 2019-05-02 DIAGNOSIS — Z87891 Personal history of nicotine dependence: Secondary | ICD-10-CM | POA: Diagnosis not present

## 2019-05-02 HISTORY — PX: INSERTION OF MESH: SHX5868

## 2019-05-02 HISTORY — PX: INGUINAL HERNIA REPAIR: SHX194

## 2019-05-02 SURGERY — REPAIR, HERNIA, INGUINAL, LAPAROSCOPIC
Anesthesia: General | Site: Abdomen | Laterality: Right

## 2019-05-02 MED ORDER — CEFAZOLIN SODIUM-DEXTROSE 2-4 GM/100ML-% IV SOLN
INTRAVENOUS | Status: AC
  Filled 2019-05-02: qty 100

## 2019-05-02 MED ORDER — ACETAMINOPHEN 500 MG PO TABS
ORAL_TABLET | ORAL | Status: AC
  Administered 2019-05-02: 1000 mg via ORAL
  Filled 2019-05-02: qty 2

## 2019-05-02 MED ORDER — SCOPOLAMINE 1 MG/3DAYS TD PT72
1.0000 | MEDICATED_PATCH | TRANSDERMAL | Status: DC
  Administered 2019-05-02: 11:00:00 1.5 mg via TRANSDERMAL

## 2019-05-02 MED ORDER — TRAMADOL HCL 50 MG PO TABS: 50.0000 mg | ORAL_TABLET | Freq: Four times a day (QID) | ORAL | 0 refills | Status: DC | PRN

## 2019-05-02 MED ORDER — OXYCODONE HCL 5 MG PO TABS
5.0000 mg | ORAL_TABLET | Freq: Once | ORAL | Status: AC | PRN
  Administered 2019-05-02: 5 mg via ORAL

## 2019-05-02 MED ORDER — PROPOFOL 10 MG/ML IV BOLUS
INTRAVENOUS | Status: AC
  Filled 2019-05-02: qty 20

## 2019-05-02 MED ORDER — OXYCODONE HCL 5 MG PO TABS
ORAL_TABLET | ORAL | Status: AC
  Filled 2019-05-02: qty 1

## 2019-05-02 MED ORDER — GABAPENTIN 300 MG PO CAPS
ORAL_CAPSULE | ORAL | Status: AC
  Administered 2019-05-02: 300 mg via ORAL
  Filled 2019-05-02: qty 1

## 2019-05-02 MED ORDER — LACTATED RINGERS IV SOLN
INTRAVENOUS | Status: DC
  Administered 2019-05-02 (×2): via INTRAVENOUS

## 2019-05-02 MED ORDER — MIDAZOLAM HCL 2 MG/2ML IJ SOLN
INTRAMUSCULAR | Status: AC
  Filled 2019-05-02: qty 2

## 2019-05-02 MED ORDER — FAMOTIDINE 20 MG PO TABS: 20.0000 mg | ORAL_TABLET | Freq: Once | ORAL | Status: DC

## 2019-05-02 MED ORDER — LIDOCAINE 2% (20 MG/ML) 5 ML SYRINGE
INTRAMUSCULAR | Status: DC | PRN
  Administered 2019-05-02: 60 mg via INTRAVENOUS

## 2019-05-02 MED ORDER — BUPIVACAINE-EPINEPHRINE (PF) 0.25% -1:200000 IJ SOLN
INTRAMUSCULAR | Status: DC | PRN
  Administered 2019-05-02: 5 mL via PERINEURAL

## 2019-05-02 MED ORDER — OXYCODONE HCL 5 MG/5ML PO SOLN: 5.0000 mg | Freq: Once | ORAL | Status: AC | PRN

## 2019-05-02 MED ORDER — ONDANSETRON HCL 4 MG/2ML IJ SOLN
INTRAMUSCULAR | Status: AC
  Filled 2019-05-02: qty 2

## 2019-05-02 MED ORDER — PHENYLEPHRINE 40 MCG/ML (10ML) SYRINGE FOR IV PUSH (FOR BLOOD PRESSURE SUPPORT)
PREFILLED_SYRINGE | INTRAVENOUS | Status: DC | PRN
  Administered 2019-05-02: 200 ug via INTRAVENOUS

## 2019-05-02 MED ORDER — PHENYLEPHRINE 40 MCG/ML (10ML) SYRINGE FOR IV PUSH (FOR BLOOD PRESSURE SUPPORT)
PREFILLED_SYRINGE | INTRAVENOUS | Status: AC
  Filled 2019-05-02: qty 10

## 2019-05-02 MED ORDER — DEXAMETHASONE SODIUM PHOSPHATE 10 MG/ML IJ SOLN
INTRAMUSCULAR | Status: DC | PRN
  Administered 2019-05-02: 10 mg via INTRAVENOUS

## 2019-05-02 MED ORDER — SUGAMMADEX SODIUM 200 MG/2ML IV SOLN
INTRAVENOUS | Status: DC | PRN
  Administered 2019-05-02: 230 mg via INTRAVENOUS

## 2019-05-02 MED ORDER — HYDROMORPHONE HCL 1 MG/ML IJ SOLN: 0.2500 mg | INTRAMUSCULAR | Status: DC | PRN

## 2019-05-02 MED ORDER — DEXAMETHASONE SODIUM PHOSPHATE 10 MG/ML IJ SOLN
INTRAMUSCULAR | Status: AC
  Filled 2019-05-02: qty 1

## 2019-05-02 MED ORDER — FENTANYL CITRATE (PF) 100 MCG/2ML IJ SOLN
INTRAMUSCULAR | Status: AC
  Filled 2019-05-02: qty 2

## 2019-05-02 MED ORDER — CHLORHEXIDINE GLUCONATE CLOTH 2 % EX PADS: 6.0000 | MEDICATED_PAD | Freq: Once | CUTANEOUS | Status: DC

## 2019-05-02 MED ORDER — GABAPENTIN 300 MG PO CAPS
300.0000 mg | ORAL_CAPSULE | ORAL | Status: AC
  Administered 2019-05-02: 11:00:00 300 mg via ORAL

## 2019-05-02 MED ORDER — MIDAZOLAM HCL 5 MG/5ML IJ SOLN
INTRAMUSCULAR | Status: DC | PRN
  Administered 2019-05-02: 2 mg via INTRAVENOUS

## 2019-05-02 MED ORDER — CEFAZOLIN SODIUM-DEXTROSE 2-4 GM/100ML-% IV SOLN
2.0000 g | INTRAVENOUS | Status: AC
  Administered 2019-05-02: 2 g via INTRAVENOUS

## 2019-05-02 MED ORDER — PROMETHAZINE HCL 25 MG/ML IJ SOLN: 6.2500 mg | INTRAMUSCULAR | Status: DC | PRN

## 2019-05-02 MED ORDER — FENTANYL CITRATE (PF) 250 MCG/5ML IJ SOLN
INTRAMUSCULAR | Status: AC
  Filled 2019-05-02: qty 5

## 2019-05-02 MED ORDER — FENTANYL CITRATE (PF) 100 MCG/2ML IJ SOLN
INTRAMUSCULAR | Status: DC | PRN
  Administered 2019-05-02: 50 ug via INTRAVENOUS
  Administered 2019-05-02: 100 ug via INTRAVENOUS

## 2019-05-02 MED ORDER — EPHEDRINE 5 MG/ML INJ
INTRAVENOUS | Status: AC
  Filled 2019-05-02: qty 10

## 2019-05-02 MED ORDER — 0.9 % SODIUM CHLORIDE (POUR BTL) OPTIME
TOPICAL | Status: DC | PRN
  Administered 2019-05-02: 1000 mL

## 2019-05-02 MED ORDER — PROPOFOL 10 MG/ML IV BOLUS
INTRAVENOUS | Status: DC | PRN
  Administered 2019-05-02: 200 mg via INTRAVENOUS

## 2019-05-02 MED ORDER — SCOPOLAMINE 1 MG/3DAYS TD PT72
MEDICATED_PATCH | TRANSDERMAL | Status: AC
  Administered 2019-05-02: 1.5 mg via TRANSDERMAL
  Filled 2019-05-02: qty 1

## 2019-05-02 MED ORDER — ROCURONIUM BROMIDE 50 MG/5ML IV SOSY
PREFILLED_SYRINGE | INTRAVENOUS | Status: DC | PRN
  Administered 2019-05-02 (×2): 40 mg via INTRAVENOUS

## 2019-05-02 MED ORDER — ONDANSETRON HCL 4 MG/2ML IJ SOLN
INTRAMUSCULAR | Status: DC | PRN
  Administered 2019-05-02: 4 mg via INTRAVENOUS

## 2019-05-02 MED ORDER — BUPIVACAINE-EPINEPHRINE (PF) 0.25% -1:200000 IJ SOLN
INTRAMUSCULAR | Status: AC
  Filled 2019-05-02: qty 30

## 2019-05-02 MED ORDER — CELECOXIB 200 MG PO CAPS
200.0000 mg | ORAL_CAPSULE | ORAL | Status: AC
  Administered 2019-05-02: 11:00:00 200 mg via ORAL

## 2019-05-02 MED ORDER — ACETAMINOPHEN 500 MG PO TABS
1000.0000 mg | ORAL_TABLET | ORAL | Status: AC
  Administered 2019-05-02: 11:00:00 1000 mg via ORAL

## 2019-05-02 MED ORDER — EPHEDRINE SULFATE-NACL 50-0.9 MG/10ML-% IV SOSY
PREFILLED_SYRINGE | INTRAVENOUS | Status: DC | PRN
  Administered 2019-05-02: 15 mg via INTRAVENOUS

## 2019-05-02 MED ORDER — CELECOXIB 200 MG PO CAPS
ORAL_CAPSULE | ORAL | Status: AC
  Administered 2019-05-02: 200 mg via ORAL
  Filled 2019-05-02: qty 1

## 2019-05-02 SURGICAL SUPPLY — 42 items
CANISTER SUCT 3000ML PPV (MISCELLANEOUS) IMPLANT
COVER SURGICAL LIGHT HANDLE (MISCELLANEOUS) ×2 IMPLANT
COVER WAND RF STERILE (DRAPES) ×2 IMPLANT
DEFOGGER SCOPE WARMER CLEARIFY (MISCELLANEOUS) IMPLANT
DERMABOND ADVANCED (GAUZE/BANDAGES/DRESSINGS) ×1
DERMABOND ADVANCED .7 DNX12 (GAUZE/BANDAGES/DRESSINGS) ×1 IMPLANT
DISSECTOR BLUNT TIP ENDO 5MM (MISCELLANEOUS) IMPLANT
ELECT REM PT RETURN 9FT ADLT (ELECTROSURGICAL) ×2
ELECTRODE REM PT RTRN 9FT ADLT (ELECTROSURGICAL) ×1 IMPLANT
ENDOLOOP SUT PDS II  0 18 (SUTURE) ×1
ENDOLOOP SUT PDS II 0 18 (SUTURE) IMPLANT
GLOVE BIO SURGEON STRL SZ7.5 (GLOVE) ×2 IMPLANT
GOWN STRL REUS W/ TWL LRG LVL3 (GOWN DISPOSABLE) ×2 IMPLANT
GOWN STRL REUS W/ TWL XL LVL3 (GOWN DISPOSABLE) ×1 IMPLANT
GOWN STRL REUS W/TWL LRG LVL3 (GOWN DISPOSABLE) ×2
GOWN STRL REUS W/TWL XL LVL3 (GOWN DISPOSABLE) ×1
KIT BASIN OR (CUSTOM PROCEDURE TRAY) ×2 IMPLANT
KIT TURNOVER KIT B (KITS) ×2 IMPLANT
MESH 3DMAX 4X6 RT LRG (Mesh General) ×1 IMPLANT
NDL INSUFFLATION 14GA 120MM (NEEDLE) IMPLANT
NEEDLE INSUFFLATION 14GA 120MM (NEEDLE) ×2 IMPLANT
NS IRRIG 1000ML POUR BTL (IV SOLUTION) ×2 IMPLANT
PAD ARMBOARD 7.5X6 YLW CONV (MISCELLANEOUS) ×4 IMPLANT
RELOAD STAPLE 4.0 BLU F/HERNIA (INSTRUMENTS) ×1 IMPLANT
RELOAD STAPLE 4.8 BLK F/HERNIA (STAPLE) IMPLANT
RELOAD STAPLE HERNIA 4.0 BLUE (INSTRUMENTS) ×2 IMPLANT
RELOAD STAPLE HERNIA 4.8 BLK (STAPLE) IMPLANT
SCISSORS LAP 5X35 DISP (ENDOMECHANICALS) ×2 IMPLANT
SET IRRIG TUBING LAPAROSCOPIC (IRRIGATION / IRRIGATOR) IMPLANT
SET TUBE SMOKE EVAC HIGH FLOW (TUBING) ×2 IMPLANT
STAPLER HERNIA 12 8.5 360D (INSTRUMENTS) ×2 IMPLANT
SUT MNCRL AB 4-0 PS2 18 (SUTURE) ×2 IMPLANT
SUT VIC AB 1 CT1 27 (SUTURE)
SUT VIC AB 1 CT1 27XBRD ANBCTR (SUTURE) IMPLANT
SUT VICRYL 0 UR6 27IN ABS (SUTURE) ×1 IMPLANT
SYRINGE TOOMEY DISP (SYRINGE) ×2 IMPLANT
TOWEL GREEN STERILE (TOWEL DISPOSABLE) ×2 IMPLANT
TOWEL GREEN STERILE FF (TOWEL DISPOSABLE) ×2 IMPLANT
TRAY LAPAROSCOPIC MC (CUSTOM PROCEDURE TRAY) ×2 IMPLANT
TROCAR CANNULA W/PORT DUAL 5MM (MISCELLANEOUS) ×2 IMPLANT
TROCAR XCEL 12X100 BLDLESS (ENDOMECHANICALS) ×2 IMPLANT
WATER STERILE IRR 1000ML POUR (IV SOLUTION) ×2 IMPLANT

## 2019-05-02 NOTE — Interval H&P Note (Signed)
History and Physical Interval Note:  05/02/2019 10:51 AM  Rachael Clark  has presented today for surgery, with the diagnosis of RIGHT INGUINAL HERNIA.  The various methods of treatment have been discussed with the patient and family. After consideration of risks, benefits and other options for treatment, the patient has consented to  Procedure(s): LAPAROSCOPIC VS OPEN RIGHT INGUINAL HERNIA REPAIR WITH MESH (Right) as a surgical intervention.  The patient's history has been reviewed, patient examined, no change in status, stable for surgery.  I have reviewed the patient's chart and labs.  Questions were answered to the patient's satisfaction.     Ralene Ok

## 2019-05-02 NOTE — OR Nursing (Signed)
Charted under Narda Amber, RN from [phone].  Rachael Clark, Therapist, sports.

## 2019-05-02 NOTE — Anesthesia Preprocedure Evaluation (Addendum)
Anesthesia Evaluation  Patient identified by MRN, date of birth, ID band Patient awake    Reviewed: Allergy & Precautions, NPO status , Patient's Chart, lab work & pertinent test results  History of Anesthesia Complications (+) PONV and history of anesthetic complications  Airway Mallampati: II  TM Distance: >3 FB Neck ROM: Full    Dental no notable dental hx.    Pulmonary former smoker,    Pulmonary exam normal breath sounds clear to auscultation       Cardiovascular negative cardio ROS Normal cardiovascular exam Rhythm:Regular Rate:Normal     Neuro/Psych  Neuromuscular disease negative psych ROS   GI/Hepatic negative GI ROS, Neg liver ROS,   Endo/Other  negative endocrine ROS  Renal/GU negative Renal ROS     Musculoskeletal negative musculoskeletal ROS (+)   Abdominal   Peds  Hematology HLD   Anesthesia Other Findings RIGHT INGUINAL HERNIA  Reproductive/Obstetrics                            Anesthesia Physical Anesthesia Plan  ASA: II  Anesthesia Plan: General   Post-op Pain Management:    Induction: Intravenous  PONV Risk Score and Plan: 4 or greater and Ondansetron, Dexamethasone, Midazolam, Scopolamine patch - Pre-op and Treatment may vary due to age or medical condition  Airway Management Planned: Oral ETT  Additional Equipment:   Intra-op Plan:   Post-operative Plan: Extubation in OR  Informed Consent: I have reviewed the patients History and Physical, chart, labs and discussed the procedure including the risks, benefits and alternatives for the proposed anesthesia with the patient or authorized representative who has indicated his/her understanding and acceptance.     Dental advisory given  Plan Discussed with: CRNA  Anesthesia Plan Comments:        Anesthesia Quick Evaluation

## 2019-05-02 NOTE — Op Note (Signed)
05/02/2019  12:54 PM  PATIENT:  Rachael Clark  58 y.o. female  PRE-OPERATIVE DIAGNOSIS:  RIGHT INGUINAL HERNIA  POST-OPERATIVE DIAGNOSIS:  RIGHT INDIRECT INGUINAL HERNIA  PROCEDURE:  Procedure(s): LAPAROSCOPIC RIGHT INGUINAL HERNIA REPAIR WITH MESH (Right)  SURGEON:  Surgeon(s) and Role:    * Ralene Ok, MD - Primary   ANESTHESIA:   local and general  EBL:  minimal   BLOOD ADMINISTERED:none  DRAINS: none   LOCAL MEDICATIONS USED:  BUPIVICAINE   SPECIMEN:  No Specimen  DISPOSITION OF SPECIMEN:  N/A  COUNTS:  YES  TOURNIQUET:  * No tourniquets in log *  DICTATION: .Dragon Dictation  Counts: reported as correct x 2  Findings:  The patient had a small right indirect hernia and cord lipoma  Indications for procedure:  The patient is a 58 year old female with a right hernia for several months. Patient complained of symptomatology to his right inguinal area. The patient was taken back for elective inguinal hernia repair.  Details of the procedure: The patient was taken back to the operating room. The patient was placed in supine position with bilateral SCDs in place.  The patient was prepped and draped in the usual sterile fashion.  After appropriate anitbiotics were confirmed, a time-out was confirmed and all facts were verified.  0.25% Marcaine was used to infiltrate the umbilical area. A 11-blade was used to cut down the skin and blunt dissection was used to get the anterior fashion.  The anterior fascia was incised approximately 1 cm and the muscles were retracted laterally. Blunt dissection was then used to create a space in the preperitoneal area. At this time a 10 mm camera was then introduced into the space and advanced the pubic tubercle and a 12 mm trocar was placed over this and insufflation was started.  At this time and space was created from medial to laterally the preperitoneal space.  Cooper's ligament was initially cleaned off.  The hernia sac was  identified in the indirect space. Dissection of the hernia sac was undertaken.  The round ligament was dissected free of the hernia sac and it was then transected.  There was a small tear into the hernia sac.  The wrent in the peritoneum was ligated using 0 PDS Endoloop.  A Veress needle right upper quadrant to help evacuate the intraperitoneal air.    Once the hernia sac was taken down to approximately the umbilicus a Bard 3D Max mesh, size: Large, was  introduced into the preperitoneal space.  The mesh was brought over to cover the direct and indirect hernia spaces.  This was anchored into place and secured to Cooper's ligament with 4.58mm staples from a Coviden hernia stapler. It was anchored to the anterior abdominal wall with 4.8 mm staples. The hernia sac was seen lying posterior to the mesh. There was no staples placed laterally. The insufflation was evacuated and the peritoneum was seen posterior to the mesh. The trochars were removed. The anterior fascia was reapproximated using #1 Vicryl on a UR- 6.  Intra-abdominal air was evacuated and the Veress needle removed. The skin was reapproximated using 4-0 Monocryl subcuticular fashion and dressed with Dermbond.  The patient was awakened from general anesthesia and taken to recovery in stable condition.   PLAN OF CARE: Discharge to home after PACU  PATIENT DISPOSITION:  PACU - hemodynamically stable.   Delay start of Pharmacological VTE agent (>24hrs) due to surgical blood loss or risk of bleeding: not applicable

## 2019-05-02 NOTE — Anesthesia Postprocedure Evaluation (Signed)
Anesthesia Post Note  Patient: Rachael Clark  Procedure(s) Performed: LAPAROSCOPIC RIGHT INGUINAL HERNIA REPAIR (Right Abdomen) Insertion Of Mesh (Right Abdomen)     Patient location during evaluation: PACU Anesthesia Type: General Level of consciousness: awake and alert Pain management: pain level controlled Vital Signs Assessment: post-procedure vital signs reviewed and stable Respiratory status: spontaneous breathing, nonlabored ventilation, respiratory function stable and patient connected to nasal cannula oxygen Cardiovascular status: blood pressure returned to baseline and stable Postop Assessment: no apparent nausea or vomiting Anesthetic complications: no    Last Vitals:  Vitals:   05/02/19 1400 05/02/19 1415  BP: 108/65 106/67  Pulse: (!) 57 65  Resp: 14 13  Temp:  36.5 C  SpO2: 97% 98%    Last Pain:  Vitals:   05/02/19 1415  TempSrc:   PainSc: 2    Pain Goal: Patients Stated Pain Goal: 4 (05/02/19 1027)                 Ryan P Ellender

## 2019-05-02 NOTE — Anesthesia Procedure Notes (Signed)
Procedure Name: Intubation Date/Time: 05/02/2019 12:17 PM Performed by: Lance Coon, CRNA Pre-anesthesia Checklist: Patient identified, Emergency Drugs available, Suction available, Patient being monitored and Timeout performed Patient Re-evaluated:Patient Re-evaluated prior to induction Oxygen Delivery Method: Circle system utilized Preoxygenation: Pre-oxygenation with 100% oxygen Induction Type: IV induction Ventilation: Mask ventilation without difficulty Laryngoscope Size: Miller and 3 Grade View: Grade II Tube type: Oral Tube size: 7.0 mm Number of attempts: 1 Airway Equipment and Method: Stylet Placement Confirmation: ETT inserted through vocal cords under direct vision,  breath sounds checked- equal and bilateral and positive ETCO2 Secured at: 21 cm Tube secured with: Tape Dental Injury: Teeth and Oropharynx as per pre-operative assessment

## 2019-05-02 NOTE — Transfer of Care (Signed)
Immediate Anesthesia Transfer of Care Note  Patient: Rachael Clark  Procedure(s) Performed: LAPAROSCOPIC RIGHT INGUINAL HERNIA REPAIR WITH MESH (Right )  Patient Location: PACU  Anesthesia Type:General  Level of Consciousness: awake and patient cooperative  Airway & Oxygen Therapy: Patient Spontanous Breathing  Post-op Assessment: Report given to RN and Post -op Vital signs reviewed and stable  Post vital signs: Reviewed and stable  Last Vitals:  Vitals Value Taken Time  BP 126/68 05/02/19 1313  Temp    Pulse 80 05/02/19 1313  Resp 16 05/02/19 1313  SpO2 98 % 05/02/19 1313  Vitals shown include unvalidated device data.  Last Pain:  Vitals:   05/02/19 1027  TempSrc:   PainSc: 3       Patients Stated Pain Goal: 4 ([id number])  Complications: No apparent anesthesia complications

## 2019-05-02 NOTE — Discharge Instructions (Signed)
[facility] _______[facility] °INGUINAL HERNIA REPAIR: POST OP INSTRUCTIONS ° °Always review your discharge instruction sheet given to you by the facility where your surgery was performed. °IF YOU HAVE DISABILITY OR FAMILY LEAVE FORMS, YOU MUST BRING THEM TO THE OFFICE FOR PROCESSING.   °DO NOT GIVE THEM TO YOUR DOCTOR. ° °1. A  prescription for pain medication may be given to you upon discharge.  Take your pain medication as prescribed, if needed.  If narcotic pain medicine is not needed, then you may take acetaminophen (Tylenol) or ibuprofen (Advil) as needed. °2. Take your usually prescribed medications unless otherwise directed. °If you need a refill on your pain medication, please contact your pharmacy.  They will contact our office to request authorization. Prescriptions will not be filled after 5 pm or on week-ends. °3. You should follow a light diet the first 24 hours after arrival home, such as soup and crackers, etc.  Be sure to include lots of fluids daily.  Resume your normal diet the day after surgery. °4.Most patients will experience some swelling and bruising around the umbilicus or in the groin and scrotum.  Ice packs and reclining will help.  Swelling and bruising can take several days to resolve.  °6. It is common to experience some constipation if taking pain medication after surgery.  Increasing fluid intake and taking a stool softener (such as Colace) will usually help or prevent this problem from occurring.  A mild laxative (Milk of Magnesia or Miralax) should be taken according to package directions if there are no bowel movements after 48 hours. °7. Unless discharge instructions indicate otherwise, you may remove your bandages 24-48 hours after surgery, and you may shower at that time.  You may have steri-strips (small skin tapes) in place directly over the incision.  These strips should be left on the skin for 7-10 days.  If your surgeon used skin glue on the incision, you may  shower in 24 hours.  The glue will flake off over the next 2-3 weeks.  Any sutures or staples will be removed at the office during your follow-up visit. °8. ACTIVITIES:  You may resume regular (light) daily activities beginning the next day--such as daily self-care, walking, climbing stairs--gradually increasing activities as tolerated.  You may have sexual intercourse when it is comfortable.  Refrain from any heavy lifting or straining until approved by your doctor. ° °a.You may drive when you are no longer taking prescription pain medication, you can comfortably wear a seatbelt, and you can safely maneuver your car and apply brakes. °b.RETURN TO WORK:   °_____________________________________________ ° °9.You should see your doctor in the office for a follow-up appointment approximately 2-3 weeks after your surgery.  Make sure that you call for this appointment within a day or two after you arrive home to insure a convenient appointment time. °10.OTHER INSTRUCTIONS: _________________________ °   _____________________________________ ° °WHEN TO CALL YOUR DOCTOR: °1. Fever over 101.0 °2. Inability to urinate °3. Nausea and/or vomiting °4. Extreme swelling or bruising °5. Continued bleeding from incision. °6. Increased pain, redness, or drainage from the incision ° °The clinic staff is available to answer your questions during regular business hours.  Please don’t hesitate to call and ask to speak to one of the nurses for clinical concerns.  If you have a medical emergency, go to the nearest emergency room or call 911.  A surgeon from [facility] is always on call at the hospital ° ° °[address], [address], [place], [place]  [address] ? ° [address] [address], [place], [place]   [address] °[phone] ? [phone] ? FAX [phone] °Web site: [email or web address] ° °

## 2019-05-03 ENCOUNTER — Encounter (HOSPITAL_COMMUNITY): Payer: Self-pay | Admitting: General Surgery

## 2019-06-02 ENCOUNTER — Telehealth: Payer: Self-pay | Admitting: Family Medicine

## 2019-06-02 DIAGNOSIS — M8589 Other specified disorders of bone density and structure, multiple sites: Secondary | ICD-10-CM

## 2019-06-02 NOTE — Addendum Note (Signed)
Addended by: Amado Coe on: 06/02/2019 01:45 PM   Modules accepted: Orders

## 2019-06-02 NOTE — Telephone Encounter (Signed)
Patient is requesting to have her bone density exam done, please place order to [facility] if order is approved by PCP please.

## 2019-06-02 NOTE — Telephone Encounter (Signed)
Bone density order placed

## 2019-06-10 ENCOUNTER — Other Ambulatory Visit: Payer: Self-pay

## 2019-06-10 ENCOUNTER — Ambulatory Visit (INDEPENDENT_AMBULATORY_CARE_PROVIDER_SITE_OTHER): Payer: BC Managed Care – PPO

## 2019-06-10 DIAGNOSIS — Z23 Encounter for immunization: Secondary | ICD-10-CM | POA: Diagnosis not present

## 2019-06-28 ENCOUNTER — Other Ambulatory Visit: Payer: Self-pay

## 2019-06-28 ENCOUNTER — Emergency Department (HOSPITAL_COMMUNITY): Payer: BC Managed Care – PPO

## 2019-06-28 ENCOUNTER — Emergency Department (HOSPITAL_COMMUNITY)
Admission: EM | Admit: 2019-06-28 | Discharge: 2019-06-28 | Disposition: A | Discharge status: Home / Self Care | Payer: BC Managed Care – PPO | Attending: Emergency Medicine | Admitting: Emergency Medicine

## 2019-06-28 ENCOUNTER — Encounter (HOSPITAL_COMMUNITY): Payer: Self-pay | Admitting: Emergency Medicine

## 2019-06-28 DIAGNOSIS — Z79899 Other long term (current) drug therapy: Secondary | ICD-10-CM | POA: Insufficient documentation

## 2019-06-28 DIAGNOSIS — K625 Hemorrhage of anus and rectum: Secondary | ICD-10-CM | POA: Insufficient documentation

## 2019-06-28 DIAGNOSIS — M06042 Rheumatoid arthritis without rheumatoid factor, left hand: Secondary | ICD-10-CM | POA: Diagnosis not present

## 2019-06-28 DIAGNOSIS — Z87891 Personal history of nicotine dependence: Secondary | ICD-10-CM | POA: Insufficient documentation

## 2019-06-28 DIAGNOSIS — R1031 Right lower quadrant pain: Secondary | ICD-10-CM | POA: Insufficient documentation

## 2019-06-28 DIAGNOSIS — M06041 Rheumatoid arthritis without rheumatoid factor, right hand: Secondary | ICD-10-CM | POA: Diagnosis not present

## 2019-06-28 DIAGNOSIS — N200 Calculus of kidney: Secondary | ICD-10-CM | POA: Diagnosis not present

## 2019-06-28 DIAGNOSIS — R9431 Abnormal electrocardiogram [ECG] [EKG]: Secondary | ICD-10-CM | POA: Diagnosis not present

## 2019-06-28 LAB — CBC
HCT: 40.3 % (ref 36.0–46.0)
Hemoglobin: 13.4 g/dL (ref 12.0–15.0)
MCH: 32.2 pg (ref 26.0–34.0)
MCHC: 33.3 g/dL (ref 30.0–36.0)
MCV: 96.9 fL (ref 80.0–100.0)
Platelets: 246 10*3/uL (ref 150–400)
RBC: 4.16 MIL/uL (ref 3.87–5.11)
RDW: 12.7 % (ref 11.5–15.5)
WBC: 9.1 10*3/uL (ref 4.0–10.5)
nRBC: 0 % (ref 0.0–0.2)

## 2019-06-28 LAB — URINALYSIS, ROUTINE W REFLEX MICROSCOPIC
Bacteria, UA: NONE SEEN
Bilirubin Urine: NEGATIVE
Glucose, UA: NEGATIVE mg/dL
Hgb urine dipstick: NEGATIVE
Ketones, ur: 5 mg/dL — AB
Nitrite: NEGATIVE
Protein, ur: NEGATIVE mg/dL
Specific Gravity, Urine: 1.012 (ref 1.005–1.030)
pH: 5 (ref 5.0–8.0)

## 2019-06-28 LAB — COMPREHENSIVE METABOLIC PANEL
ALT: 21 U/L (ref 0–44)
AST: 19 U/L (ref 15–41)
Albumin: 4.3 g/dL (ref 3.5–5.0)
Alkaline Phosphatase: 57 U/L (ref 38–126)
Anion gap: 11 (ref 5–15)
BUN: 15 mg/dL (ref 6–20)
CO2: 23 mmol/L (ref 22–32)
Calcium: 9.5 mg/dL (ref 8.9–10.3)
Chloride: 105 mmol/L (ref 98–111)
Creatinine, Ser: 0.57 mg/dL (ref 0.44–1.00)
GFR calc Af Amer: >60 mL/min (ref >60)
GFR calc non Af Amer: >60 mL/min (ref >60)
Glucose, Bld: 99 mg/dL (ref 70–99)
Potassium: 4.1 mmol/L (ref 3.5–5.1)
Sodium: 139 mmol/L (ref 135–145)
Total Bilirubin: 1.2 mg/dL (ref 0.3–1.2)
Total Protein: 7 g/dL (ref 6.5–8.1)

## 2019-06-28 LAB — LIPASE, BLOOD: Lipase: 32 U/L (ref 11–51)

## 2019-06-28 LAB — I-STAT BETA HCG BLOOD, ED (MC, WL, AP ONLY): I-stat hCG, quantitative: <5 m[IU]/mL (ref <5)

## 2019-06-28 MED ORDER — IOHEXOL 300 MG/ML  SOLN
100.0000 mL | Freq: Once | INTRAMUSCULAR | Status: AC | PRN
  Administered 2019-06-28: 18:00:00 100 mL via INTRAVENOUS

## 2019-06-28 MED ORDER — HYDROCODONE-ACETAMINOPHEN 5-325 MG PO TABS: ORAL_TABLET | ORAL | 0 refills | Status: DC

## 2019-06-28 MED ORDER — HYDROCODONE-ACETAMINOPHEN 5-325 MG PO TABS
1.0000 | ORAL_TABLET | Freq: Once | ORAL | Status: AC
  Administered 2019-06-28: 1 via ORAL
  Filled 2019-06-28: qty 1

## 2019-06-28 MED ORDER — SODIUM CHLORIDE 0.9% FLUSH: 3.0000 mL | Freq: Once | INTRAVENOUS | Status: DC

## 2019-06-28 NOTE — Discharge Instructions (Signed)
Please read and follow all provided instructions.  Your diagnoses today include:  1. Right lower quadrant abdominal pain   2. Bright red blood per rectum     Tests performed today include:  Blood counts and electrolytes  Blood tests to check liver and kidney function  CT scan -does not show any concerning findings for infection or postoperative problems  Vital signs. See below for your results today.   Medications prescribed:   Vicodin (hydrocodone/acetaminophen) - narcotic pain medication  DO NOT drive or perform any activities that require you to be awake and alert because this medicine can make you drowsy. BE VERY CAREFUL not to take multiple medicines containing Tylenol (also called acetaminophen). Doing so can lead to an overdose which can damage your liver and cause liver failure and possibly death.  Take any prescribed medications only as directed.  Home care instructions:   Follow any educational materials contained in this packet.  Avoid medications like aspirin or NSAIDs like ibuprofen or meloxicam  Follow-up instructions: Please follow-up with the surgeon and gastroenterologist listed for your bleeding.  Return instructions:  SEEK IMMEDIATE MEDICAL ATTENTION IF:  The pain does not go away or becomes severe   A temperature above 101F develops   Return if you have worsening or larger amounts of blood in the stool  Return if you have lightheadedness, passing out, chest pain, shortness of breath, or feel very weak  Repeated vomiting occurs (multiple episodes)   The pain becomes localized to portions of the abdomen. The right side could possibly be appendicitis. In an adult, the left lower portion of the abdomen could be colitis or diverticulitis.   You develop chest pain, difficulty breathing, dizziness or fainting, or become confused, poorly responsive, or inconsolable (young children)  If you have any other emergent concerns regarding your  health  Additional Information: Abdominal (belly) pain can be caused by many things. Your caregiver performed an examination and possibly ordered blood/urine tests and imaging (CT scan, x-rays, ultrasound). Many cases can be observed and treated at home after initial evaluation in the emergency department. Even though you are being discharged home, abdominal pain can be unpredictable. Therefore, you need a repeated exam if your pain does not resolve, returns, or worsens. Most patients with abdominal pain don't have to be admitted to the hospital or have surgery, but serious problems like appendicitis and gallbladder attacks can start out as nonspecific pain. Many abdominal conditions cannot be diagnosed in one visit, so follow-up evaluations are very important.  Your vital signs today were: BP 123/61 (BP Location: Right Arm)    Pulse 77    Temp 98.4 F (36.9 C) (Oral)    Resp 16    Ht 5\' 4"  (1.626 m)    Wt 59.9 kg    SpO2 99%    BMI 22.66 kg/m  If your blood pressure (bp) was elevated above 135/85 this visit, please have this repeated by your doctor within one month. --------------

## 2019-06-28 NOTE — ED Notes (Signed)
ED Provider at bedside Lenn Sink at triage.

## 2019-06-28 NOTE — ED Notes (Signed)
Patient verbalizes understanding of discharge instructions. Opportunity for questioning and answers were provided. Armband removed by staff, pt discharged from ED.  

## 2019-06-28 NOTE — ED Triage Notes (Signed)
Pt presents with c/o of frank red blood after a bowel movement today along right RLQ with radiation down the right leg. She is post op hernia repair. NAD at triage.

## 2019-06-28 NOTE — ED Provider Notes (Signed)
[facility] EMERGENCY DEPARTMENT Provider Note   CSN: [id number] Arrival date & time: 06/28/19  1504     History   Chief Complaint Chief Complaint  Patient presents with   Abdominal Pain    HPI Rachael Clark is a 58 y.o. female.     Patient status post right inguinal hernia repair 2 months ago presents to the emergency department with complaint of right-sided abdominal pain and bright red blood per rectum.  Patient states that she has had intermittent pain over the past 2 to 3 weeks in the right side of the abdomen.  It favors the lower abdomen.  Today she had a bowel movement that contained bright red blood and dripping into the toilet.  She denies a history of hemorrhoids.  Pain was a little bit worse than usual today.  No lightheadedness, syncope, chest pain, shortness of breath.  She does not take any blood thinners.  She does take chronic meloxicam.  Last colonoscopy in [place] 8 years ago which was normal per her report.  She has been in contact with her general surgery group.     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   Dense breast tissue 09/03/2017   Osteopenia after menopause 09/03/2017   Family history of malignant neoplasm of uterus 09/03/2017   History of ovarian cyst 09/03/2017   Carpal tunnel syndrome, bilateral 03/25/2017   Vaginal dryness, menopausal 03/25/2017   Rheumatoid arthritis involving both hands ([place]) 01/22/2017   Chronic left shoulder pain 01/22/2017   Chronic shoulder bursitis, left 01/22/2017   Muscle strain 01/22/2017   Encounter for screening mammogram for  breast cancer 01/22/2017   Benign breast lumps- B/L FNA's - both Benign 08/27/2016   Osteopenia-  q 2 yrs bone scan-  last one was bone density 2015 08/27/2016   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   Hyperlipidemia 08/27/2016   Vaginal atrophy 08/27/2016    Past Surgical History:  Procedure Laterality Date   ABDOMINOPLASTY     APPENDECTOMY     BREAST BIOPSY Bilateral    BREAST LUMPECTOMY Bilateral    CESAREAN SECTION     x 1   CHOLECYSTECTOMY     INCONTINENCE SURGERY     TVT   INGUINAL HERNIA REPAIR Right 05/02/2019   Procedure: LAPAROSCOPIC RIGHT INGUINAL HERNIA REPAIR;  Surgeon: Ralene Ok, MD;  Location: [facility];  Service: General;  Laterality: Right;   INSERTION OF MESH Right 05/02/2019   Procedure: Insertion Of Mesh;  Surgeon: Ralene Ok, MD;  Location: [facility];  Service: General;  Laterality: Right;   NOSE SURGERY     OVARIAN CYST REMOVAL     TONSILLECTOMY     TOTAL VAGINAL HYSTERECTOMY  2007   still has her tubes and ovaries   UMBILICAL HERNIA REPAIR     WRIST SURGERY Right      OB History    Gravida  4   Para  3   Term  3   Preterm      AB  1   Living  3     SAB  1  TAB      Ectopic      Multiple      Live Births  3            Home Medications    Prior to Admission medications   Medication Sig Start Date End Date Taking? Authorizing Provider  acetaminophen (TYLENOL) 650 MG CR tablet Take 650 mg by mouth at bedtime as needed for pain.    [provider]  Ascorbic Acid (VITAMIN C) 1000 MG tablet Take 1,000 mg by mouth daily.    [provider]  Biotin (BIOTIN 5000) 5 MG CAPS Take 5 mg by mouth daily.    [provider]  Calcium Carb-Cholecalciferol (CALCIUM 600+D) 600-800 MG-UNIT TABS Take 1 tablet by mouth daily.    [provider]  cholecalciferol (VITAMIN D3) 25 MCG (1000 UT) tablet Take 1,000 Units by mouth daily.    [provider]  Cyanocobalamin (B-12) 2500  MCG TABS Take 2,500 mcg by mouth daily.    [provider]  HYDROcodone-acetaminophen (NORCO/VICODIN) 5-325 MG tablet Take 1-2 tablets every 6 hours as needed for severe pain 06/28/19   Carlisle Cater, PA-C  Magnesium 250 MG TABS Take 250 mg by mouth daily.    [provider]  meloxicam (MOBIC) 15 MG tablet TAKE 1 TABLET BY MOUTH EVERY DAY Patient taking differently: Take 15 mg by mouth daily.  02/28/19   Opalski, Neoma Laming, DO  Psyllium (METAMUCIL PO) Take 1 Dose by mouth daily as needed (constipation).    [provider]  simvastatin (ZOCOR) 10 MG tablet TAKE 1 TABLET BY MOUTH EVERYDAY AT BEDTIME Patient taking differently: Take 10 mg by mouth at bedtime.  03/21/19   Opalski, Neoma Laming, DO  traMADol (ULTRAM) 50 MG tablet Take 1 tablet (50 mg total) by mouth every 6 (six) hours as needed. 05/02/19 05/01/20  Ralene Ok, MD  Turmeric 500 MG CAPS Take 500 mg by mouth daily.    [provider]  vitamin E 400 UNIT capsule Take 400 Units by mouth daily.    [provider]  YUVAFEM 10 MCG TABS vaginal tablet INSERT 1 TABLET TWICE A WEEK INTO VAGINA AS DIRECTED Patient taking differently: Place 10 mcg vaginally 2 (two) times a week.  01/31/19   Mellody Dance, DO    Family History Family History  Problem Relation Age of Onset   Osteoporosis Mother    Dementia Mother    Osteopenia Mother    Healthy Father    Healthy Sister    Healthy Brother    Healthy Daughter    Healthy Son    Healthy Sister    Healthy Sister    Healthy Brother    Healthy Son    Uterine cancer Maternal Grandmother     Social History Social History   Tobacco Use   Smoking status: Former Smoker    Packs/day: 0.25    Years: 4.00    Pack years: 1.00    Quit date: 05/02/1996    Years since quitting: 23.1   Smokeless tobacco: Never Used  Substance Use Topics   Alcohol use: Yes    Alcohol/week: 4.0 standard drinks    Types: 4 Glasses of wine per week   Drug  use: No     Allergies   Patient has no known allergies.   Review of Systems Review of Systems  Constitutional: Negative for fever.  HENT: Negative for rhinorrhea and sore throat.   Eyes: Negative for redness.  Respiratory: Negative for cough.  Cardiovascular: Negative for chest pain.  Gastrointestinal: Positive for abdominal pain and blood in stool. Negative for diarrhea, nausea and vomiting.  Genitourinary: Negative for dysuria.  Musculoskeletal: Negative for myalgias.  Skin: Negative for rash.  Neurological: Negative for headaches.     Physical Exam Updated Vital Signs BP 123/61 (BP Location: Right Arm)    Pulse 77    Temp 98.4 F (36.9 C) (Oral)    Resp 16    Ht 5\' 4"  (1.626 m)    Wt 59.9 kg    SpO2 99%    BMI 22.66 kg/m   Physical Exam Vitals signs and nursing note reviewed.  Constitutional:      Appearance: She is well-developed.  HENT:     Head: Normocephalic and atraumatic.  Eyes:     General:        Right eye: No discharge.        Left eye: No discharge.     Conjunctiva/sclera: Conjunctivae normal.  Neck:     Musculoskeletal: Normal range of motion and neck supple.  Cardiovascular:     Rate and Rhythm: Normal rate and regular rhythm.     Heart sounds: Normal heart sounds.  Pulmonary:     Effort: Pulmonary effort is normal.     Breath sounds: Normal breath sounds.  Abdominal:     Palpations: Abdomen is soft.     Tenderness: There is abdominal tenderness in the right upper quadrant and right lower quadrant.     Comments: Minimal tenderness on the right side of the abdomen without guarding or rebound.  No concerning skin findings.  Skin:    General: Skin is warm and dry.  Neurological:     Mental Status: She is alert.      ED Treatments / Results  Labs (all labs ordered are listed, but only abnormal results are displayed) Labs Reviewed  URINALYSIS, ROUTINE W REFLEX MICROSCOPIC - Abnormal; Notable for the following components:      Result Value    APPearance HAZY (*)    Ketones, ur 5 (*)    Leukocytes,Ua TRACE (*)    All other components within normal limits  LIPASE, BLOOD  COMPREHENSIVE METABOLIC PANEL  CBC  I-STAT BETA HCG BLOOD, ED ([facility], [facility], [facility] ONLY)    EKG None  Radiology Ct Abdomen Pelvis W Contrast  Result Date: 06/28/2019 CLINICAL DATA:  Abdominal pain with episode of blood in stool EXAM: CT ABDOMEN AND PELVIS WITH CONTRAST TECHNIQUE: Multidetector CT imaging of the abdomen and pelvis was performed using the standard protocol following bolus administration of intravenous contrast. CONTRAST:  111mL OMNIPAQUE IOHEXOL 300 MG/ML  SOLN COMPARISON:  None. FINDINGS: Lower chest: Lung bases are clear. Hepatobiliary: There is an 8 x 8 mm cyst in the left lobe of the liver. No other focal liver lesions are evident. Gallbladder is absent. There is no appreciable biliary duct dilatation. Pancreas: There is no pancreatic mass or inflammatory focus. Spleen: No splenic lesions are evident. Adrenals/Urinary Tract: Adrenals bilaterally appear normal. Kidneys bilaterally show no evident mass or hydronephrosis on either side. There is a 7 x 5 mm calculus in the lower pole of the left kidney. A second 7 x 5 mm calculus is noted slightly more anteriorly in the lower pole left kidney as well. There is no right-sided renal calculus. There is no evident ureteral calculus on either side. Urinary bladder is midline with wall thickness within normal limits. Stomach/Bowel: There is no appreciable bowel wall or mesenteric thickening. There is  moderate stool in the colon. Terminal ileum appears unremarkable. No bowel obstruction. No evident free air or portal venous air. Vascular/Lymphatic: No abdominal aortic aneurysm. No vascular lesions are evident. There is no appreciable adenopathy in the abdomen or pelvis. Reproductive: Uterus absent.  No pelvic mass evident. Other: Appendix absent. No periappendiceal inflammation evident. There is no abscess or ascites in the  abdomen or pelvis. There is postoperative change in the right inguinal region. Musculoskeletal: There is degenerative change in the lumbar spine with vacuum phenomenon at L4-5. There is moderate spinal stenosis at L4-5 due to diffuse disc protrusion and bony hypertrophy. There are no blastic or lytic bone lesions. There is no intramuscular or abdominal wall lesions. IMPRESSION: 1. Nonobstructing calculi in the lower pole left kidney. No hydronephrosis or ureteral calculus on either side. Urinary bladder wall thickness normal. 2. Appendix absent. No periappendiceal region inflammation. No bowel obstruction. No abscess in the abdomen or pelvis. 3. Moderate spinal stenosis at L4-5 due to diffuse disc protrusion and bony hypertrophy. 4.  Uterus and gallbladder absent. Electronically Signed   By: Lowella Grip III M.D.   On: 06/28/2019 18:23    Procedures Procedures (including critical care time)  Medications Ordered in ED Medications  sodium chloride flush (NS) 0.9 % injection 3 mL (has no administration in time range)  HYDROcodone-acetaminophen (NORCO/VICODIN) 5-325 MG per tablet 1 tablet (has no administration in time range)  iohexol (OMNIPAQUE) 300 MG/ML solution 100 mL (100 mLs Intravenous Contrast Given 06/28/19 1735)     Initial Impression / Assessment and Plan / ED Course  I have reviewed the triage vital signs and the nursing notes.  Pertinent labs & imaging results that were available during my care of the patient were reviewed by me and considered in my medical decision making (see chart for details).        Patient seen and examined.  Reviewed patient's CT imaging which is reassuring for any postoperative complications, infections, obstructions, perforations.  No signs of colitis or other infection.  Patient with previous cholecystectomy, appendectomy, hysterectomy.  Vital signs reviewed and are as follows: BP 123/61 (BP Location: Right Arm)    Pulse 77    Temp 98.4 F (36.9 C)  (Oral)    Resp 16    Ht 5\' 4"  (1.626 m)    Wt 59.9 kg    SpO2 99%    BMI 22.66 kg/m   I went over the patient's results tonight with her.  Her vital signs are normal.  Her pain is reasonably controlled and she does not appear uncomfortable.  At this point we will discharge her to home.  I encouraged her to follow-up with her general surgeon.  I have given her referral to gastroenterology for evaluation of rectal bleeding.  She is nearing the 10-year mark for repeat colonoscopy.  Discussed that she should return with worsening amounts of blood especially if she becomes symptomatic with chest pain, shortness of breath, lightheadedness, syncope, or if the pain becomes more severe.  Will be given a small amount of Vicodin to use for pain.  She is encouraged to discontinue meloxicam for the time being and avoid other NSAIDs and aspirin.  Patient counseled on use of narcotic pain medications. Counseled not to combine these medications with others containing tylenol. Urged not to drink alcohol, drive, or perform any other activities that requires focus while taking these medications. The patient verbalizes understanding and agrees with the plan.   Final Clinical Impressions(s) / ED Diagnoses  Final diagnoses:  Right lower quadrant abdominal pain  Bright red blood per rectum   Patient with right-sided abdominal pain now with bright red blood today.  No anticoagulation.  Vital signs are normal.  Normal hemoglobin.  Abdominal exam is without signs of rebound or guarding.  CT of the abdomen does not show any signs of perforation, infection, postoperative complications.  Patient has been in touch with her general surgeon regarding her symptoms.  At this point no indication for further work-up or admission.  She is referred to GI, general surgery, and will be discharged home with strict return instructions and precautions as above.  Patient looks well.   ED Discharge Orders         Ordered     HYDROcodone-acetaminophen (NORCO/VICODIN) 5-325 MG tablet     06/28/19 2117           Carlisle Cater, PA-C 06/28/19 2140    Charlesetta Shanks, MD 06/29/19 Shelah Lewandowsky

## 2019-06-28 NOTE — ED Provider Notes (Signed)
58 year old female seen in triage.  History of inguinal hernia repair.  She notes of the last 3 weeks she has had worsening pain in the right groin.  She notes today she had a bloody bowel movement.  She denies any upper abdominal pain fever nausea vomiting.  Patient is afebrile with no tachycardia no obvious masses on my exam she does have right lower quadrant tenderness along with tenderness in the right groin.  Will order CT abdomen pelvis with contrast at this time pending open room as none are available and will not be available soon secondary to hospital crowding.  Vitals:   06/28/19 1520  BP: (!) 141/79  Pulse: 90  Resp: 20  Temp: 98.4 F (36.9 C)  SpO2: 100%     Okey Regal, PA-C 06/28/19 [place], MD 06/29/19 [phone]

## 2019-06-29 ENCOUNTER — Other Ambulatory Visit: Payer: Self-pay

## 2019-06-30 ENCOUNTER — Encounter: Payer: Self-pay | Admitting: Gastroenterology

## 2019-06-30 ENCOUNTER — Other Ambulatory Visit: Payer: Self-pay

## 2019-06-30 ENCOUNTER — Ambulatory Visit (INDEPENDENT_AMBULATORY_CARE_PROVIDER_SITE_OTHER): Payer: BC Managed Care – PPO | Admitting: Gastroenterology

## 2019-06-30 VITALS — BP 141/65 | HR 82 | Temp 98.2°F | Ht 62.0 in | Wt 127.5 lb

## 2019-06-30 DIAGNOSIS — K625 Hemorrhage of anus and rectum: Secondary | ICD-10-CM

## 2019-06-30 NOTE — Progress Notes (Signed)
Rachael Clark [address]  [place]  [place],  [address]  Main: [phone]  Fax: [phone]   Gastroenterology Consultation  Referring Provider:     Mellody Dance, DO Primary Care Physician:  Mellody Dance, DO Reason for Consultation:     Right lower quadrant pain, blood per rectum        HPI:    Chief Complaint  Patient presents with   New Patient (Initial Visit)   Abdominal Pain    Patient has had RLQ pain since in the ER. the pain is a sharp pain and has constipation     Ma D Rachael Clark is a 58 y.o. y/o female referred for consultation & management  by Dr. Mellody Dance, DO.  Patient had a right inguinal hernia repair with mesh placement on May 02, 2019.  Patient reported pain in the area prior to the surgery.  However, since the surgery has continued to have pain in that area.  Although it is less intense than it was before.  Pain medications relieve the pain.  She went to the ER recently due to noting bright red blood for 1 day within the stool.  No previous history of similar symptoms and no such symptoms since then.  Has had brown stools over the last 1 to 2 days.  No nausea or vomiting.  No melena.  Reports history of a colonoscopy about 10 years ago in [place] that was normal and was done for screening.  States no polyps were found.  No family history of colon cancer.  Past Medical History:  Diagnosis Date   Benign breast lumps    Hyperlipidemia    Kidney stone    Osteopenia    PONV (postoperative nausea and vomiting)     Past Surgical History:  Procedure Laterality Date   ABDOMINOPLASTY     APPENDECTOMY     BREAST BIOPSY Bilateral    BREAST LUMPECTOMY Bilateral    CESAREAN SECTION     x 1   CHOLECYSTECTOMY     INCONTINENCE SURGERY     TVT   INGUINAL HERNIA REPAIR Right 05/02/2019   Procedure: LAPAROSCOPIC RIGHT INGUINAL HERNIA REPAIR;  Surgeon: Ralene Ok, MD;  Location: [facility];  Service: General;   Laterality: Right;   INSERTION OF MESH Right 05/02/2019   Procedure: Insertion Of Mesh;  Surgeon: Ralene Ok, MD;  Location: [facility];  Service: General;  Laterality: Right;   NOSE SURGERY     OVARIAN CYST REMOVAL     TONSILLECTOMY     TOTAL VAGINAL HYSTERECTOMY  2007   still has her tubes and ovaries   UMBILICAL HERNIA REPAIR     WRIST SURGERY Right     Prior to Admission medications   Medication Sig Start Date End Date Taking? Authorizing Provider  acetaminophen (TYLENOL) 650 MG CR tablet Take 650 mg by mouth at bedtime as needed for pain.   Yes [provider]  Ascorbic Acid (VITAMIN C) 1000 MG tablet Take 1,000 mg by mouth daily.   Yes [provider]  Biotin (BIOTIN 5000) 5 MG CAPS Take 5 mg by mouth daily.   Yes [provider]  Calcium Carb-Cholecalciferol (CALCIUM 600+D) 600-800 MG-UNIT TABS Take 1 tablet by mouth daily.   Yes [provider]  cholecalciferol (VITAMIN D3) 25 MCG (1000 UT) tablet Take 1,000 Units by mouth daily.   Yes [provider]  Cyanocobalamin (B-12) 2500 MCG TABS Take 2,500 mcg by mouth daily.  Yes [provider]  HYDROcodone-acetaminophen (NORCO/VICODIN) 5-325 MG tablet Take 1-2 tablets every 6 hours as needed for severe pain 06/28/19  Yes Carlisle Cater, PA-C  Magnesium 250 MG TABS Take 250 mg by mouth daily.   Yes [provider]  Psyllium (METAMUCIL PO) Take 1 Dose by mouth daily as needed (constipation).   Yes [provider]  simvastatin (ZOCOR) 10 MG tablet TAKE 1 TABLET BY MOUTH EVERYDAY AT BEDTIME Patient taking differently: Take 10 mg by mouth at bedtime.  03/21/19  Yes Opalski, Neoma Laming, DO  Turmeric 500 MG CAPS Take 500 mg by mouth daily.   Yes [provider]  vitamin E 400 UNIT capsule Take 400 Units by mouth daily.   Yes [provider]  YUVAFEM 10 MCG TABS vaginal tablet INSERT 1 TABLET TWICE A WEEK INTO VAGINA AS DIRECTED Patient taking  differently: Place 10 mcg vaginally 2 (two) times a week.  01/31/19  Yes Opalski, Neoma Laming, DO    Family History  Problem Relation Age of Onset   Osteoporosis Mother    Dementia Mother    Osteopenia Mother    Healthy Father    Healthy Sister    Healthy Brother    Healthy Daughter    Healthy Son    Healthy Sister    Healthy Sister    Healthy Brother    Healthy Son    Uterine cancer Maternal Grandmother      Social History   Tobacco Use   Smoking status: Former Smoker    Packs/day: 0.25    Years: 4.00    Pack years: 1.00    Quit date: 05/02/1996    Years since quitting: 23.1   Smokeless tobacco: Never Used  Substance Use Topics   Alcohol use: Yes    Alcohol/week: 4.0 standard drinks    Types: 4 Glasses of wine per week   Drug use: No    Allergies as of 06/30/2019   (No Known Allergies)    Review of Systems:    All systems reviewed and negative except where noted in HPI.   Physical Exam:  BP (!) 141/65 (BP Location: Left Arm, Patient Position: Sitting, Cuff Size: Normal)    Pulse 82    Temp 98.2 F (36.8 C) (Oral)    Ht 5\' 2"  (1.575 m)    Wt 127 lb 8 oz (57.8 kg)    BMI 23.32 kg/m  No LMP recorded. Patient has had a hysterectomy. Psych:  Alert and cooperative. Normal mood and affect. General:   Alert,  Well-developed, well-nourished, pleasant and cooperative in NAD Head:  Normocephalic and atraumatic. Eyes:  Sclera clear, no icterus.   Conjunctiva pink. Ears:  Normal auditory acuity. Nose:  No deformity, discharge, or lesions. Mouth:  No deformity or lesions,oropharynx pink & moist. Neck:  Supple; no masses or thyromegaly. Abdomen:  Normal bowel sounds.  No bruits.  Soft, non-tender and non-distended without masses, hepatosplenomegaly or hernias noted.  No guarding or rebound tenderness.   Does have right inguinal area tenderness to palpation Rectal exam: No external erythema or lesion, DRE with yellow stool with no blood Msk:  Symmetrical without  gross deformities. Good, equal movement & strength bilaterally. Pulses:  Normal pulses noted. Extremities:  No clubbing or edema.  No cyanosis. Neurologic:  Alert and oriented x3;  grossly normal neurologically. Skin:  Intact without significant lesions or rashes. No jaundice. Lymph Nodes:  No significant cervical adenopathy. Psych:  Alert and cooperative. Normal mood and affect.  Labs: CBC    Component Value Date/Time   WBC 9.1 06/28/2019 1555   RBC 4.16 06/28/2019 1555   HGB 13.4 06/28/2019 1555   HGB 12.4 09/15/2018 0925   HCT 40.3 06/28/2019 1555   HCT 36.0 09/15/2018 0925   PLT 246 06/28/2019 1555   PLT 247 09/15/2018 0925   MCV 96.9 06/28/2019 1555   MCV 93 09/15/2018 0925   MCH 32.2 06/28/2019 1555   MCHC 33.3 06/28/2019 1555   RDW 12.7 06/28/2019 1555   RDW 12.2 09/15/2018 0925   LYMPHSABS 2.0 09/15/2018 0925   EOSABS 0.2 09/15/2018 0925   BASOSABS 0.1 09/15/2018 0925   CMP     Component Value Date/Time   NA 139 06/28/2019 1555   NA 141 09/15/2018 0925   K 4.1 06/28/2019 1555   CL 105 06/28/2019 1555   CO2 23 06/28/2019 1555   GLUCOSE 99 06/28/2019 1555   BUN 15 06/28/2019 1555   BUN 14 09/15/2018 0925   CREATININE 0.57 06/28/2019 1555   CALCIUM 9.5 06/28/2019 1555   PROT 7.0 06/28/2019 1555   PROT 6.5 09/15/2018 0925   ALBUMIN 4.3 06/28/2019 1555   ALBUMIN 4.5 09/15/2018 0925   AST 19 06/28/2019 1555   ALT 21 06/28/2019 1555   ALKPHOS 57 06/28/2019 1555   BILITOT 1.2 06/28/2019 1555   BILITOT 0.9 09/15/2018 0925   GFRNONAA >60 06/28/2019 1555   GFRAA >60 06/28/2019 1555    Imaging Studies: Ct Abdomen Pelvis W Contrast  Result Date: 06/28/2019 CLINICAL DATA:  Abdominal pain with episode of blood in stool EXAM: CT ABDOMEN AND PELVIS WITH CONTRAST TECHNIQUE: Multidetector CT imaging of the abdomen and pelvis was performed using the standard protocol following bolus administration of intravenous contrast. CONTRAST:  181mL OMNIPAQUE IOHEXOL 300 MG/ML   SOLN COMPARISON:  None. FINDINGS: Lower chest: Lung bases are clear. Hepatobiliary: There is an 8 x 8 mm cyst in the left lobe of the liver. No other focal liver lesions are evident. Gallbladder is absent. There is no appreciable biliary duct dilatation. Pancreas: There is no pancreatic mass or inflammatory focus. Spleen: No splenic lesions are evident. Adrenals/Urinary Tract: Adrenals bilaterally appear normal. Kidneys bilaterally show no evident mass or hydronephrosis on either side. There is a 7 x 5 mm calculus in the lower pole of the left kidney. A second 7 x 5 mm calculus is noted slightly more anteriorly in the lower pole left kidney as well. There is no right-sided renal calculus. There is no evident ureteral calculus on either side. Urinary bladder is midline with wall thickness within normal limits. Stomach/Bowel: There is no appreciable bowel wall or mesenteric thickening. There is moderate stool in the colon. Terminal ileum appears unremarkable. No bowel obstruction. No evident free air or portal venous air. Vascular/Lymphatic: No abdominal aortic aneurysm. No vascular lesions are evident. There is no appreciable adenopathy in the abdomen or pelvis. Reproductive: Uterus absent.  No pelvic mass evident. Other: Appendix absent. No periappendiceal inflammation evident. There is no abscess or ascites in the abdomen or pelvis. There is postoperative change in the right inguinal region. Musculoskeletal: There is degenerative change in the lumbar spine with vacuum phenomenon at L4-5. There is moderate spinal stenosis at L4-5 due to diffuse disc protrusion and bony hypertrophy. There are no blastic or lytic bone lesions. There is no intramuscular or abdominal wall lesions. IMPRESSION: 1. Nonobstructing calculi in the lower pole left kidney. No hydronephrosis or ureteral calculus on either side. Urinary bladder wall thickness normal.  2. Appendix absent. No periappendiceal region inflammation. No bowel  obstruction. No abscess in the abdomen or pelvis. 3. Moderate spinal stenosis at L4-5 due to diffuse disc protrusion and bony hypertrophy. 4.  Uterus and gallbladder absent. Electronically Signed   By: Lowella Grip III M.D.   On: 06/28/2019 18:23    Assessment and Plan:   Ma D Rachael Clark is a 58 y.o. y/o female has been referred for right-sided abdominal pain and blood per rectum  Her right-sided pain is not in her abdomen per se, and is more in the right inguinal region.  Patient states this is the same area that she has had pain since prior to the surgery, although it is less intense since the surgery  She has not seen her surgeon recently and I have asked her to follow-up with them due to the pain in the same area  After they evaluated her and cleared her, we can proceed with colonoscopy both for screening and evaluation of blood per rectum.  It is likely due to internal hemorrhoids given that it occurred one time with a bowel movement and she reports some constipation as well.  start MiraLAX once daily  No alarm symptoms present and lab and CT scan otherwise reassuring   Dr Rachael Clark  Speech recognition software was used to dictate the above note.

## 2019-06-30 NOTE — Patient Instructions (Signed)
Have you scheduled with general surgeon on 07/01/2019 at 3:50pm please arrive 15 minutes early for check in. Address  [address] [place], [place], [place]. Phone number [phone]  Please take Miralax daily when you have hard stools

## 2019-07-05 ENCOUNTER — Ambulatory Visit: Payer: BC Managed Care – PPO

## 2019-07-12 ENCOUNTER — Telehealth: Payer: Self-pay

## 2019-07-12 DIAGNOSIS — Z1211 Encounter for screening for malignant neoplasm of colon: Secondary | ICD-10-CM

## 2019-07-12 NOTE — Telephone Encounter (Signed)
Patient is calling because she states that her surgeon has cleared her for a colonoscopy. Called the surgeon and they are faxing the note to [place]. When I received the note I will call patient back to scheduled the colonoscopy

## 2019-07-13 ENCOUNTER — Other Ambulatory Visit: Payer: Self-pay

## 2019-07-13 ENCOUNTER — Other Ambulatory Visit: Payer: Self-pay | Admitting: Gastroenterology

## 2019-07-13 DIAGNOSIS — Z1211 Encounter for screening for malignant neoplasm of colon: Secondary | ICD-10-CM

## 2019-07-13 MED ORDER — NA SULFATE-K SULFATE-MG SULF 17.5-3.13-1.6 GM/177ML PO SOLN: 354.0000 mL | Freq: Once | ORAL | 0 refills | Status: AC

## 2019-07-13 MED ORDER — PEG 3350-KCL-NA BICARB-NACL 420 G PO SOLR: 4000.0000 mL | Freq: Once | ORAL | 0 refills | Status: AC

## 2019-07-13 MED ORDER — BISACODYL EC 5 MG PO TBEC: DELAYED_RELEASE_TABLET | ORAL | 0 refills | Status: DC

## 2019-07-13 NOTE — Telephone Encounter (Signed)
Copy of visit is getting scan in chart and is on you desk to review. The Surgeon did okay patient to have colonoscopy what is diagnosis for the colonoscopy.

## 2019-07-13 NOTE — Telephone Encounter (Signed)
Scheduled patient for 07/27/2019. Went over instruction with patient and sent them to patient mychart. Informed patient of her covid test. Sent prep to the pharmacy

## 2019-07-13 NOTE — Progress Notes (Signed)
Suprep was not covered for patient changed prep

## 2019-07-18 ENCOUNTER — Ambulatory Visit: Payer: BC Managed Care – PPO | Admitting: Gastroenterology

## 2019-07-19 ENCOUNTER — Telehealth: Payer: Self-pay | Admitting: Gastroenterology

## 2019-07-19 ENCOUNTER — Other Ambulatory Visit
Admission: RE | Admit: 2019-07-19 | Discharge: 2019-07-19 | Disposition: A | Discharge status: Home / Self Care | Payer: BC Managed Care – PPO | Source: Ambulatory Visit | Attending: Gastroenterology | Admitting: Gastroenterology

## 2019-07-19 DIAGNOSIS — Z20828 Contact with and (suspected) exposure to other viral communicable diseases: Secondary | ICD-10-CM | POA: Insufficient documentation

## 2019-07-19 DIAGNOSIS — Z01812 Encounter for preprocedural laboratory examination: Secondary | ICD-10-CM | POA: Diagnosis not present

## 2019-07-19 MED ORDER — PEG 3350-KCL-NA BICARB-NACL 420 G PO SOLR: 4000.0000 mL | Freq: Once | ORAL | 0 refills | Status: AC

## 2019-07-19 NOTE — Telephone Encounter (Signed)
Patient states that the pharmacy did not have the prep. Sent the prep to the pharmacy for patient. Patient insurance did not cover the Suprep changed it to the [facility] and informed patient that she needed to drink 8oz every 30 minutes till the medication was gone. Patient verbalized understanding

## 2019-07-19 NOTE — Telephone Encounter (Signed)
Pt is calling for  Questions regarding her rx for the procedure please call pt

## 2019-07-19 NOTE — Telephone Encounter (Signed)
Called and left a message for call back  

## 2019-07-19 NOTE — Telephone Encounter (Signed)
Patient called back for [facility]

## 2019-07-20 LAB — SARS CORONAVIRUS 2 (TAT 6-24 HRS): SARS Coronavirus 2: NEGATIVE

## 2019-07-22 ENCOUNTER — Ambulatory Visit: Payer: BC Managed Care – PPO | Admitting: Anesthesiology

## 2019-07-22 ENCOUNTER — Ambulatory Visit
Admission: RE | Admit: 2019-07-22 | Discharge: 2019-07-22 | Disposition: A | Discharge status: Home / Self Care | Payer: BC Managed Care – PPO | Attending: Gastroenterology | Admitting: Gastroenterology

## 2019-07-22 ENCOUNTER — Encounter
Admission: RE | Disposition: A | Discharge status: Home / Self Care | Payer: Self-pay | Source: Home / Self Care | Attending: Gastroenterology

## 2019-07-22 ENCOUNTER — Other Ambulatory Visit: Payer: Self-pay

## 2019-07-22 ENCOUNTER — Encounter: Payer: Self-pay | Admitting: *Deleted

## 2019-07-22 DIAGNOSIS — D123 Benign neoplasm of transverse colon: Secondary | ICD-10-CM | POA: Diagnosis not present

## 2019-07-22 DIAGNOSIS — N200 Calculus of kidney: Secondary | ICD-10-CM | POA: Diagnosis not present

## 2019-07-22 DIAGNOSIS — K648 Other hemorrhoids: Secondary | ICD-10-CM | POA: Insufficient documentation

## 2019-07-22 DIAGNOSIS — Z87891 Personal history of nicotine dependence: Secondary | ICD-10-CM | POA: Insufficient documentation

## 2019-07-22 DIAGNOSIS — E785 Hyperlipidemia, unspecified: Secondary | ICD-10-CM | POA: Diagnosis not present

## 2019-07-22 DIAGNOSIS — K635 Polyp of colon: Secondary | ICD-10-CM | POA: Diagnosis not present

## 2019-07-22 DIAGNOSIS — Z1211 Encounter for screening for malignant neoplasm of colon: Secondary | ICD-10-CM

## 2019-07-22 HISTORY — DX: Personal history of urinary calculi: Z87.442

## 2019-07-22 HISTORY — PX: COLONOSCOPY WITH PROPOFOL: SHX5780

## 2019-07-22 SURGERY — COLONOSCOPY WITH PROPOFOL
Anesthesia: General

## 2019-07-22 MED ORDER — ONDANSETRON HCL 4 MG/2ML IJ SOLN
INTRAMUSCULAR | Status: DC | PRN
  Administered 2019-07-22: 4 mg via INTRAVENOUS

## 2019-07-22 MED ORDER — MIDAZOLAM HCL 2 MG/2ML IJ SOLN
INTRAMUSCULAR | Status: DC | PRN
  Administered 2019-07-22 (×2): 1 mg via INTRAVENOUS

## 2019-07-22 MED ORDER — MIDAZOLAM HCL 2 MG/2ML IJ SOLN
INTRAMUSCULAR | Status: AC
  Filled 2019-07-22: qty 2

## 2019-07-22 MED ORDER — PROPOFOL 500 MG/50ML IV EMUL
INTRAVENOUS | Status: DC | PRN
  Administered 2019-07-22: 180 ug/kg/min via INTRAVENOUS

## 2019-07-22 MED ORDER — PROPOFOL 500 MG/50ML IV EMUL
INTRAVENOUS | Status: AC
  Filled 2019-07-22: qty 50

## 2019-07-22 MED ORDER — PROPOFOL 10 MG/ML IV BOLUS
INTRAVENOUS | Status: DC | PRN
  Administered 2019-07-22: 50 mg via INTRAVENOUS
  Administered 2019-07-22: 40 mg via INTRAVENOUS

## 2019-07-22 MED ORDER — PHENYLEPHRINE HCL (PRESSORS) 10 MG/ML IV SOLN
INTRAVENOUS | Status: DC | PRN
  Administered 2019-07-22: 100 ug via INTRAVENOUS
  Administered 2019-07-22: 80 ug via INTRAVENOUS
  Administered 2019-07-22: 100 ug via INTRAVENOUS

## 2019-07-22 MED ORDER — FENTANYL CITRATE (PF) 100 MCG/2ML IJ SOLN
INTRAMUSCULAR | Status: AC
  Filled 2019-07-22: qty 2

## 2019-07-22 MED ORDER — FENTANYL CITRATE (PF) 100 MCG/2ML IJ SOLN
INTRAMUSCULAR | Status: DC | PRN
  Administered 2019-07-22 (×2): 50 ug via INTRAVENOUS

## 2019-07-22 MED ORDER — SODIUM CHLORIDE 0.9 % IV SOLN
INTRAVENOUS | Status: DC
  Administered 2019-07-22 (×2): via INTRAVENOUS

## 2019-07-22 NOTE — Op Note (Signed)
[facility] Gastroenterology Patient Name: Corwin Levins [facility] Procedure Date: 07/22/2019 12:20 PM MRN: [id number] Account #: [id number] Date of Birth: August 08, 1961 Admit Type: Outpatient Age: 58 Room: [facility] ENDO ROOM 2 Gender: Female Note Status: Finalized Procedure:             Colonoscopy Indications:           Screening for colorectal malignant neoplasm Providers:             Vernette Moise B. Bonna Gains MD, MD Referring MD:          Mellody Dance (Referring MD) Medicines:             Monitored Anesthesia Care Complications:         No immediate complications. Procedure:             Pre-Anesthesia Assessment:                        - ASA Grade Assessment: II - A patient with mild                         systemic disease.                        - Prior to the procedure, a History and Physical was                         performed, and patient medications, allergies and                         sensitivities were reviewed. The patient's tolerance                         of previous anesthesia was reviewed.                        - The risks and benefits of the procedure and the                         sedation options and risks were discussed with the                         patient. All questions were answered and informed                         consent was obtained.                        - Patient identification and proposed procedure were                         verified prior to the procedure by the physician, the                         nurse, the anesthesiologist, the anesthetist and the                         technician. The procedure was verified in the                         procedure  room.                        After obtaining informed consent, the colonoscope was                         passed under direct vision. Throughout the procedure,                         the patient's blood pressure, pulse, and oxygen                         saturations were  monitored continuously. The                         Colonoscope was introduced through the anus and                         advanced to the the cecum, identified by appendiceal                         orifice and ileocecal valve. The colonoscopy was                         performed with ease. The patient tolerated the                         procedure well. The quality of the bowel preparation                         was good. Findings:      The perianal and digital rectal examinations were normal.      A 5 mm polyp was found in the transverse colon. The polyp was sessile.       The polyp was removed with a jumbo cold forceps. Resection and retrieval       were complete.      The exam was otherwise without abnormality.      The rectum, sigmoid colon, descending colon, transverse colon, ascending       colon and cecum appeared normal.      Non-bleeding internal hemorrhoids were found during retroflexion. The       hemorrhoids were small. Impression:            - One 5 mm polyp in the transverse colon, removed with                         a jumbo cold forceps. Resected and retrieved.                        - The examination was otherwise normal.                        - The rectum, sigmoid colon, descending colon,                         transverse colon, ascending colon and cecum are normal.                        - Non-bleeding internal hemorrhoids. Recommendation:        -  Discharge patient to home (with escort).                        - Advance diet as tolerated.                        - Continue present medications.                        - Await pathology results.                        - Repeat colonoscopy date to be determined after                         pending pathology results are reviewed.                        - The findings and recommendations were discussed with                         the patient.                        - The findings and recommendations were  discussed with                         the patient's family.                        - Return to primary care physician as previously                         scheduled.                        - High fiber diet. Procedure Code(s):     --- Professional ---                        [phone], Colonoscopy, flexible; with biopsy, single or                         multiple Diagnosis Code(s):     --- Professional ---                        Z12.11, Encounter for screening for malignant neoplasm                         of colon                        K63.5, Polyp of colon CPT copyright 2019 American Medical Association. All rights reserved. The codes documented in this report are preliminary and upon coder review may  be revised to meet current compliance requirements.  [name], MD Margretta Sidle B. Bonna Gains MD, MD 07/22/2019 1:26:12 PM This report has been signed electronically. Number of Addenda: 0 Note Initiated On: 07/22/2019 12:20 PM Scope Withdrawal Time: 0 hours 23 minutes 8 seconds  Total Procedure Duration: 0 hours 34 minutes 52 seconds  Estimated Blood Loss:  Estimated blood loss: none.      2020 [facility]

## 2019-07-22 NOTE — Anesthesia Procedure Notes (Signed)
Date/Time: 07/22/2019 12:36 PM Performed by: Allean Found, CRNA Pre-anesthesia Checklist: Patient identified, Emergency Drugs available, Suction available, Patient being monitored and Timeout performed Patient Re-evaluated:Patient Re-evaluated prior to induction Oxygen Delivery Method: Nasal cannula Placement Confirmation: positive ETCO2

## 2019-07-22 NOTE — Anesthesia Postprocedure Evaluation (Signed)
Anesthesia Post Note  Patient: Rachael Clark  Procedure(s) Performed: COLONOSCOPY WITH PROPOFOL (N/A )  Patient location during evaluation: Endoscopy Anesthesia Type: General Level of consciousness: awake and alert and oriented Pain management: pain level controlled Vital Signs Assessment: post-procedure vital signs reviewed and stable Respiratory status: spontaneous breathing, nonlabored ventilation and respiratory function stable Cardiovascular status: blood pressure returned to baseline and stable Postop Assessment: no signs of nausea or vomiting Anesthetic complications: no     Last Vitals:  Vitals:   07/22/19 1122 07/22/19 1321  BP: 136/78 102/61  Pulse: 70 67  Resp: 16   Temp: (!) 36.2 C   SpO2: 100% 100%    Last Pain:  Vitals:   07/22/19 1351  TempSrc:   PainSc: 0-No pain                 Anajah Sterbenz

## 2019-07-22 NOTE — Anesthesia Post-op Follow-up Note (Signed)
Anesthesia QCDR form completed.        

## 2019-07-22 NOTE — Anesthesia Preprocedure Evaluation (Signed)
Anesthesia Evaluation  Patient identified by MRN, date of birth, ID band Patient awake    Reviewed: Allergy & Precautions, H&P , NPO status , Patient's Chart, lab work & pertinent test results, reviewed documented beta blocker date and time   History of Anesthesia Complications (+) PONV and history of anesthetic complications  Airway Mallampati: II   Neck ROM: full    Dental  (+) Poor Dentition   Pulmonary neg pulmonary ROS, former smoker,    Pulmonary exam normal        Cardiovascular negative cardio ROS Normal cardiovascular exam Rhythm:regular Rate:Normal     Neuro/Psych  Neuromuscular disease negative psych ROS   GI/Hepatic negative GI ROS, Neg liver ROS,   Endo/Other  negative endocrine ROS  Renal/GU Renal disease  negative genitourinary   Musculoskeletal   Abdominal   Peds  Hematology negative hematology ROS (+)   Anesthesia Other Findings Past Medical History: No date: Benign breast lumps No date: History of kidney stones No date: Hyperlipidemia No date: Kidney stone No date: Osteopenia No date: PONV (postoperative nausea and vomiting) Past Surgical History: No date: ABDOMINAL HYSTERECTOMY No date: ABDOMINOPLASTY No date: APPENDECTOMY No date: BREAST BIOPSY; Bilateral No date: BREAST LUMPECTOMY; Bilateral No date: CESAREAN SECTION     Comment:  x 1 No date: CHOLECYSTECTOMY No date: INCONTINENCE SURGERY     Comment:  TVT 05/02/2019: INGUINAL HERNIA REPAIR; Right     Comment:  Procedure: LAPAROSCOPIC RIGHT INGUINAL HERNIA REPAIR;                Surgeon: Ralene Ok, MD;  Location: [facility];                Service: General;  Laterality: Right; 05/02/2019: INSERTION OF MESH; Right     Comment:  Procedure: Insertion Of Mesh;  Surgeon: Ralene Ok, MD;  Location: [facility];  Service: General;                Laterality: Right; No date: NOSE SURGERY No date: OVARIAN CYST REMOVAL No  date: TONSILLECTOMY 2007: TOTAL VAGINAL HYSTERECTOMY     Comment:  still has her tubes and ovaries No date: UMBILICAL HERNIA REPAIR No date: WRIST SURGERY; Right BMI    Body Mass Index: 23.59 kg/m     Reproductive/Obstetrics negative OB ROS                             Anesthesia Physical Anesthesia Plan  ASA: III  Anesthesia Plan: General   Post-op Pain Management:    Induction:   PONV Risk Score and Plan:   Airway Management Planned:   Additional Equipment:   Intra-op Plan:   Post-operative Plan:   Informed Consent: I have reviewed the patients History and Physical, chart, labs and discussed the procedure including the risks, benefits and alternatives for the proposed anesthesia with the patient or authorized representative who has indicated his/her understanding and acceptance.     Dental Advisory Given  Plan Discussed with: CRNA  Anesthesia Plan Comments:         Anesthesia Quick Evaluation

## 2019-07-22 NOTE — Transfer of Care (Signed)
Immediate Anesthesia Transfer of Care Note  Patient: Rachael Clark  Procedure(s) Performed: COLONOSCOPY WITH PROPOFOL (N/A )  Patient Location: PACU  Anesthesia Type:General  Level of Consciousness: awake, alert  and oriented  Airway & Oxygen Therapy: Patient Spontanous Breathing and Patient connected to nasal cannula oxygen  Post-op Assessment: Report given to RN and Post -op Vital signs reviewed and stable  Post vital signs: Reviewed and stable  Last Vitals:  Vitals Value Taken Time  BP 102/61 07/22/19 1321  Temp    Pulse 67 07/22/19 1322  Resp 13 07/22/19 1322  SpO2 100 % 07/22/19 1322  Vitals shown include unvalidated device data.  Last Pain:  Vitals:   07/22/19 1321  TempSrc: Temporal  PainSc: 0-No pain         Complications: No apparent anesthesia complications

## 2019-07-22 NOTE — H&P (Signed)
[name], MD [address], [place], [name], [place], [address] [address], [place], [place], [place], [address] Phone: [phone]  Fax: [phone]  Primary Care Physician:  Mellody Dance, DO   Pre-Procedure History & Physical: HPI:  Rachael Clark is a 58 y.o. female is here for a colonoscopy.   Past Medical History:  Diagnosis Date  . Benign breast lumps   . History of kidney stones   . Hyperlipidemia   . Kidney stone   . Osteopenia   . PONV (postoperative nausea and vomiting)     Past Surgical History:  Procedure Laterality Date  . ABDOMINAL HYSTERECTOMY    . ABDOMINOPLASTY    . APPENDECTOMY    . BREAST BIOPSY Bilateral   . BREAST LUMPECTOMY Bilateral   . CESAREAN SECTION     x 1  . CHOLECYSTECTOMY    . INCONTINENCE SURGERY     TVT  . INGUINAL HERNIA REPAIR Right 05/02/2019   Procedure: LAPAROSCOPIC RIGHT INGUINAL HERNIA REPAIR;  Surgeon: Ralene Ok, MD;  Location: [facility];  Service: General;  Laterality: Right;  . INSERTION OF MESH Right 05/02/2019   Procedure: Insertion Of Mesh;  Surgeon: Ralene Ok, MD;  Location: [facility];  Service: General;  Laterality: Right;  . NOSE SURGERY    . OVARIAN CYST REMOVAL    . TONSILLECTOMY    . TOTAL VAGINAL HYSTERECTOMY  2007   still has her tubes and ovaries  . UMBILICAL HERNIA REPAIR    . WRIST SURGERY Right     Prior to Admission medications   Medication Sig Start Date End Date Taking? Authorizing Provider  acetaminophen (TYLENOL) 650 MG CR tablet Take 650 mg by mouth at bedtime as needed for pain.   Yes [provider]  Ascorbic Acid (VITAMIN C) 1000 MG tablet Take 1,000 mg by mouth daily.   Yes [provider]  bisacodyl (BISACODYL) 5 MG EC tablet Take 2 tablets (10mg ) by mouth the day before your procedure between 1pm-3pm. 07/13/19  Yes Demitrius Crass, Lennette Bihari, MD  Calcium Carb-Cholecalciferol (CALCIUM 600+D) 600-800 MG-UNIT TABS Take 1 tablet by mouth daily.   Yes  [provider]  cholecalciferol (VITAMIN D3) 25 MCG (1000 UT) tablet Take 1,000 Units by mouth daily.   Yes [provider]  Cyanocobalamin (B-12) 2500 MCG TABS Take 2,500 mcg by mouth daily.   Yes [provider]  Magnesium 250 MG TABS Take 250 mg by mouth daily.   Yes [provider]  Psyllium (METAMUCIL PO) Take 1 Dose by mouth daily as needed (constipation).   Yes [provider]  simvastatin (ZOCOR) 10 MG tablet TAKE 1 TABLET BY MOUTH EVERYDAY AT BEDTIME Patient taking differently: Take 10 mg by mouth at bedtime.  03/21/19  Yes Opalski, Neoma Laming, DO  Turmeric 500 MG CAPS Take 500 mg by mouth daily.   Yes [provider]  vitamin E 400 UNIT capsule Take 400 Units by mouth daily.   Yes [provider]  YUVAFEM 10 MCG TABS vaginal tablet INSERT 1 TABLET TWICE A WEEK INTO VAGINA AS DIRECTED Patient taking differently: Place 10 mcg vaginally 2 (two) times a week.  01/31/19  Yes Opalski, Neoma Laming, DO  Biotin (BIOTIN 5000) 5 MG CAPS Take 5 mg by mouth daily.    [provider]  HYDROcodone-acetaminophen (NORCO/VICODIN) 5-325 MG tablet Take 1-2 tablets every 6 hours as needed for severe pain 06/28/19   Carlisle Cater, PA-C    Allergies as of 07/13/2019  . (  No Known Allergies)    Family History  Problem Relation Age of Onset  . Osteoporosis Mother   . Dementia Mother   . Osteopenia Mother   . Healthy Father   . Healthy Sister   . Healthy Brother   . Healthy Daughter   . Healthy Son   . Healthy Sister   . Healthy Sister   . Healthy Brother   . Healthy Son   . Uterine cancer Maternal Grandmother     Social History   Socioeconomic History  . Marital status: Divorced    Spouse name: Not on file  . Number of children: Not on file  . Years of education: Not on file  . Highest education level: Not on file  Occupational History  . Not on file  Social Needs  . Financial resource strain: Not on file  . Food  insecurity    Worry: Not on file    Inability: Not on file  . Transportation needs    Medical: Not on file    Non-medical: Not on file  Tobacco Use  . Smoking status: Former Smoker    Packs/day: 0.25    Years: 4.00    Pack years: 1.00    Quit date: 05/02/1996    Years since quitting: 23.2  . Smokeless tobacco: Never Used  Substance and Sexual Activity  . Alcohol use: Yes    Alcohol/week: 4.0 standard drinks    Types: 4 Glasses of wine per week  . Drug use: No  . Sexual activity: Yes    Partners: Male    Birth control/protection: Surgical  Lifestyle  . Physical activity    Days per week: Not on file    Minutes per session: Not on file  . Stress: Not on file  Relationships  . Social Herbalist on phone: Not on file    Gets together: Not on file    Attends religious service: Not on file    Active member of club or organization: Not on file    Attends meetings of clubs or organizations: Not on file    Relationship status: Not on file  . Intimate partner violence    Fear of current or ex partner: Not on file    Emotionally abused: Not on file    Physically abused: Not on file    Forced sexual activity: Not on file  Other Topics Concern  . Not on file  Social History Narrative  . Not on file    Review of Systems: See HPI, otherwise negative ROS  Physical Exam: BP 136/78   Pulse 70   Temp (!) 97.2 F (36.2 C) (Temporal)   Resp 16   Ht 5\' 2"  (1.575 m)   Wt 58.5 kg   SpO2 100%   BMI 23.59 kg/m  General:   Alert,  pleasant and cooperative in NAD Head:  Normocephalic and atraumatic. Neck:  Supple; no masses or thyromegaly. Lungs:  Clear throughout to auscultation, normal respiratory effort.    Heart:  +S1, +S2, Regular rate and rhythm, No edema. Abdomen:  Soft, nontender and nondistended. Normal bowel sounds, without guarding, and without rebound.   Neurologic:  Alert and  oriented x4;  grossly normal neurologically.  Impression/Plan: Rachael Clark is here for a colonoscopy to be performed for average risk screening.  Risks, benefits, limitations, and alternatives regarding  colonoscopy have been reviewed with the patient.  Questions have been answered.  All parties agreeable.  Virgel Manifold, MD  07/22/2019, 11:35 AM

## 2019-07-25 ENCOUNTER — Encounter: Payer: Self-pay | Admitting: Gastroenterology

## 2019-07-25 LAB — SURGICAL PATHOLOGY

## 2019-07-27 ENCOUNTER — Encounter: Payer: Self-pay | Admitting: Gastroenterology

## 2019-08-06 ENCOUNTER — Other Ambulatory Visit: Payer: Self-pay | Admitting: Family Medicine

## 2019-08-06 DIAGNOSIS — M1990 Unspecified osteoarthritis, unspecified site: Secondary | ICD-10-CM

## 2019-08-06 DIAGNOSIS — M069 Rheumatoid arthritis, unspecified: Secondary | ICD-10-CM

## 2019-08-06 DIAGNOSIS — M159 Polyosteoarthritis, unspecified: Secondary | ICD-10-CM

## 2019-08-16 ENCOUNTER — Ambulatory Visit
Admission: RE | Admit: 2019-08-16 | Discharge: 2019-08-16 | Disposition: A | Discharge status: Home / Self Care | Payer: BC Managed Care – PPO | Source: Ambulatory Visit | Attending: Family Medicine | Admitting: Family Medicine

## 2019-08-16 ENCOUNTER — Other Ambulatory Visit: Payer: Self-pay

## 2019-08-16 DIAGNOSIS — Z1231 Encounter for screening mammogram for malignant neoplasm of breast: Secondary | ICD-10-CM

## 2019-08-16 DIAGNOSIS — M85852 Other specified disorders of bone density and structure, left thigh: Secondary | ICD-10-CM | POA: Diagnosis not present

## 2019-08-16 DIAGNOSIS — Z780 Asymptomatic menopausal state: Secondary | ICD-10-CM | POA: Diagnosis not present

## 2019-08-16 DIAGNOSIS — M810 Age-related osteoporosis without current pathological fracture: Secondary | ICD-10-CM | POA: Diagnosis not present

## 2019-08-16 DIAGNOSIS — M8589 Other specified disorders of bone density and structure, multiple sites: Secondary | ICD-10-CM

## 2019-09-16 ENCOUNTER — Encounter: Payer: Self-pay | Admitting: Family Medicine

## 2019-09-16 ENCOUNTER — Other Ambulatory Visit: Payer: Self-pay

## 2019-09-16 ENCOUNTER — Ambulatory Visit (INDEPENDENT_AMBULATORY_CARE_PROVIDER_SITE_OTHER): Payer: BC Managed Care – PPO | Admitting: Family Medicine

## 2019-09-16 VITALS — BP 111/72 | HR 77 | Temp 97.4°F | Resp 12 | Ht 62.21 in | Wt 133.3 lb

## 2019-09-16 DIAGNOSIS — Z1159 Encounter for screening for other viral diseases: Secondary | ICD-10-CM | POA: Diagnosis not present

## 2019-09-16 DIAGNOSIS — E782 Mixed hyperlipidemia: Secondary | ICD-10-CM | POA: Diagnosis not present

## 2019-09-16 DIAGNOSIS — Z23 Encounter for immunization: Secondary | ICD-10-CM | POA: Diagnosis not present

## 2019-09-16 DIAGNOSIS — Z0000 Encounter for general adult medical examination without abnormal findings: Secondary | ICD-10-CM | POA: Diagnosis not present

## 2019-09-16 DIAGNOSIS — K5909 Other constipation: Secondary | ICD-10-CM | POA: Insufficient documentation

## 2019-09-16 DIAGNOSIS — Z114 Encounter for screening for human immunodeficiency virus [HIV]: Secondary | ICD-10-CM

## 2019-09-16 DIAGNOSIS — M1990 Unspecified osteoarthritis, unspecified site: Secondary | ICD-10-CM

## 2019-09-16 DIAGNOSIS — M810 Age-related osteoporosis without current pathological fracture: Secondary | ICD-10-CM

## 2019-09-16 DIAGNOSIS — Z719 Counseling, unspecified: Secondary | ICD-10-CM

## 2019-09-16 DIAGNOSIS — K648 Other hemorrhoids: Secondary | ICD-10-CM

## 2019-09-16 NOTE — Patient Instructions (Addendum)
You will be due for your next bone density scan (DEXA) on 08/15/2021.  You will be due for your next colonoscopy on 07/21/2024.       Bone Health Bones protect organs, store calcium, anchor muscles, and support the whole body. Keeping your bones strong is important, especially as you get older. You can take actions to help keep your bones strong and healthy. Why is keeping my bones healthy important?  Keeping your bones healthy is important because your body constantly replaces bone cells. Cells get old, and new cells take their place. As we age, we lose bone cells because the body may not be able to make enough new cells to replace the old cells. The amount of bone cells and bone tissue you have is referred to as bone mass. The higher your bone mass, the stronger your bones. The aging process leads to an overall loss of bone mass in the body, which can increase the likelihood of:  Joint pain and stiffness.  Broken bones.  A condition in which the bones become weak and brittle (osteoporosis). A large decline in bone mass occurs in older adults. In women, it occurs about the time of menopause. What actions can I take to keep my bones healthy? Good health habits are important for maintaining healthy bones. This includes eating nutritious foods and exercising regularly. To have healthy bones, you need to get enough of the right minerals and vitamins. Most nutrition experts recommend getting these nutrients from the foods that you eat. In some cases, taking supplements may also be recommended. Doing certain types of exercise is also important for bone health. What are the nutritional recommendations for healthy bones?  Eating a well-balanced diet with plenty of calcium and vitamin D will help to protect your bones. Nutritional recommendations vary from person to person. Ask your health care provider what is healthy for you. Here are some general guidelines. Get enough calcium Calcium is the  most important (essential) mineral for bone health. Most people can get enough calcium from their diet, but supplements may be recommended for people who are at risk for osteoporosis. Good sources of calcium include:  Dairy products, such as low-fat or nonfat milk, cheese, and yogurt.  Dark green leafy vegetables, such as bok choy and broccoli.  Calcium-fortified foods, such as orange juice, cereal, bread, soy beverages, and tofu products.  Nuts, such as almonds. Follow these recommended amounts for daily calcium intake:  Children, age 32-3: 700 mg.  Children, age 80-8: 1,000 mg.  Children, age 80-13: 1,300 mg.  Teens, age 35-18: 1,300 mg.  Adults, age 323-50: 1,000 mg.  Adults, age 62-70: ? Men: 1,000 mg. ? Women: 1,200 mg.  Adults, age 37 or older: 1,200 mg.  Pregnant and breastfeeding females: ? Teens: 1,300 mg. ? Adults: 1,000 mg. Get enough vitamin D Vitamin D is the most essential vitamin for bone health. It helps the body absorb calcium. Sunlight stimulates the skin to make vitamin D, so be sure to get enough sunlight. If you live in a cold climate or you do not get outside often, your health care provider may recommend that you take vitamin D supplements. Good sources of vitamin D in your diet include:  Egg yolks.  Saltwater fish.  Milk and cereal fortified with vitamin D. Follow these recommended amounts for daily vitamin D intake:  Children and teens, age 32-18: 600 international units.  Adults, age 9 or younger: 400-800 international units.  Adults, age 32 or older: 59-1,000  international units. Get other important nutrients Other nutrients that are important for bone health include:  Phosphorus. This mineral is found in meat, poultry, dairy foods, nuts, and legumes. The recommended daily intake for adult men and adult women is 700 mg.  Magnesium. This mineral is found in seeds, nuts, dark green vegetables, and legumes. The recommended daily intake for adult  men is 400-420 mg. For adult women, it is 310-320 mg.  Vitamin K. This vitamin is found in green leafy vegetables. The recommended daily intake is 120 mg for adult men and 90 mg for adult women. What type of physical activity is best for building and maintaining healthy bones? Weight-bearing and strength-building activities are important for building and maintaining healthy bones. Weight-bearing activities cause muscles and bones to work against gravity. Strength-building activities increase the strength of the muscles that support bones. Weight-bearing and muscle-building activities include:  Walking and hiking.  Jogging and running.  Dancing.  Gym exercises.  Lifting weights.  Tennis and racquetball.  Climbing stairs.  Aerobics. Adults should get at least 30 minutes of moderate physical activity on most days. Children should get at least 60 minutes of moderate physical activity on most days. Ask your health care provider what type of exercise is best for you. How can I find out if my bone mass is low? Bone mass can be measured with an X-ray test called a bone mineral density (BMD) test. This test is recommended for all women who are age 14 or older. It may also be recommended for:  Men who are age 8 or older.  People who are at risk for osteoporosis because of: ? Having bones that break easily. ? Having a long-term disease that weakens bones, such as kidney disease or rheumatoid arthritis. ? Having menopause earlier than normal. ? Taking medicine that weakens bones, such as steroids, thyroid hormones, or hormone treatment for breast cancer or prostate cancer. ? Smoking. ? Drinking three or more alcoholic drinks a day. If you find that you have a low bone mass, you may be able to prevent osteoporosis or further bone loss by changing your diet and lifestyle. Where can I find more information? For more information, check out the following websites:  [place]: AviationTales.fr  [employer] of Health: www.bones.SouthExposed.es  International Osteoporosis Foundation: Administrator.iofbonehealth.org Summary  The aging process leads to an overall loss of bone mass in the body, which can increase the likelihood of broken bones and osteoporosis.  Eating a well-balanced diet with plenty of calcium and vitamin D will help to protect your bones.  Weight-bearing and strength-building activities are also important for building and maintaining strong bones.  Bone mass can be measured with an X-ray test called a bone mineral density (BMD) test. This information is not intended to replace advice given to you by your health care provider. Make sure you discuss any questions you have with your health care provider. Document Revised: 09/14/2017 Document Reviewed: 09/14/2017 Elsevier Patient Education  2020 [employer] for Osteoporosis Osteoporosis causes your bones to become weak and brittle. This puts you at greater risk for bone breaks (fractures) from small bumps or falls. Making changes to your diet and increasing your physical activity can help strengthen your bones and improve your overall health. Calcium and vitamin D are nutrients that play an important role in bone health. Vitamin D helps your body use calcium and strengthen bones. Therefore, it is important to get enough calcium  and vitamin D as part of your eating plan for osteoporosis. What are tips for following this plan? Reading food labels  Try to get at least 1,000 milligrams (mg) of calcium each day.  Look for foods that have at least 50 mg of calcium per serving.  Talk with your health care provider about taking a calcium supplement if you do not get enough calcium from food.  Do not have more than 2,500 mg of calcium each day. This is the upper limit for food and nutritional supplements combined. Too much calcium may cause constipation and prevent you from  absorbing other important nutrients.  Choose foods that contain vitamin D.  Take a daily vitamin supplement that contains 800-1,000 international units (IU) of vitamin D. The amount may be different depending on your age, body weight, ethnicity, and where you live. Talk with your dietitian or health care provider about how much vitamin D is right for you.  Avoid foods that have more than 300 mg of sodium per serving. Too much sodium can cause your body to lose calcium.  Talk with your dietitian or health care provider about how much sodium you are allowed each day. Shopping  Do not buy foods with added salt, including: ? Salted snacks. ? Angie Fava. ? Canned soups. ? Canned meats. ? Processed meats, such as bacon or cold cuts. ? Smoked fish. Meal planning  Eat balanced meals that contain protein foods, fruits and vegetables, and foods rich in calcium and vitamin D.  Eat at least 5 servings of fruits and vegetables each day.  Eat 5-6 oz. of lean meat, poultry, fish, eggs, or beans each day. Lifestyle  Do not use any products that contain nicotine or tobacco, such as cigarettes and e-cigarettes. If you need help quitting, ask your health care provider.  If your health care provider recommends that you lose weight: ? Work with a dietitian to develop an eating plan that will help you reach your desired weight goal. ? Exercise for at least 30 minutes a day, 5 or more days a week, or as told by your health care provider.  Work with a physical therapist to develop an exercise plan that includes flexibility, balance, and strength exercises.  If you drink alcohol, limit how much you have. This means: ? 0-1 drink a day for women. ? 0-2 drinks a day for men. ? Be aware of how much alcohol is in your drink. In the [place] one drink equals one typical bottle of beer (12 oz), one-half glass of wine (5 oz), or one shot of hard liquor (1 oz). What foods should I eat? Foods high in  calcium   Yogurt. Yogurt with fruit.  Milk. Evaporated skim milk. Dry milk powder.  Calcium-fortified orange juice.  Parmesan cheese. Part-skim ricotta cheese. Natural hard cheese. Cream cheese. Cottage cheese.  Canned sardines. Canned salmon.  Calcium-treated tofu. Calcium-fortified cereal bar. Calcium-fortified cereal. Calcium-fortified graham crackers.  Cooked collard greens. Turnip greens. Broccoli. Kale.  Almonds.  White beans.  Corn tortilla. Foods high in vitamin D  Cod liver oil. Fatty fish, such as tuna, mackerel, and salmon.  Milk. Fortified soy milk. Fortified fruit juice.  Yogurt. Margarine.  Egg yolks. Foods high in protein  Beef. Lamb. Pork tenderloin.  Chicken breast.  Tuna (canned). Fish fillet.  Tofu.  Soy beans (cooked). Soy patty. Beans (canned or cooked).  Cottage cheese.  Yogurt.  Peanut butter.  Pumpkin seeds. Nuts. Sunflower seeds.  Hard cheese.  Milk or other milk  products, such as soy milk. The items listed above may not be a complete list of foods and beverages you can eat. Contact a dietitian for more options. Summary  Calcium and vitamin D are nutrients that play an important role in bone health and are an important part of your eating plan for osteoporosis.  Eat balanced meals that contain protein foods, fruits and vegetables, and foods rich in calcium and vitamin D.  Avoid foods that have more than 300 mg of sodium per serving. Too much sodium can cause your body to lose calcium.  Exercise is an important part of prevention and treatment of osteoporosis. Aim for at least 30 minutes a day, 5 days a week. This information is not intended to replace advice given to you by your health care provider. Make sure you discuss any questions you have with your health care provider. Document Revised: 10/26/2017 Document Reviewed: 10/26/2017 Elsevier Patient Education  2020 [place] for Adults,  Female  A healthy lifestyle and preventive care can promote health and wellness. Preventive health guidelines for women include the following key practices.   A routine yearly physical is a good way to check with your health care provider about your health and preventive screening. It is a chance to share any concerns and updates on your health and to receive a thorough exam.   Visit your dentist for a routine exam and preventive care every 6 months. Brush your teeth twice a day and floss once a day. Good oral hygiene prevents tooth decay and gum disease.   The frequency of eye exams is based on your age, health, family medical history, use of contact lenses, and other factors. Follow your health care provider's recommendations for frequency of eye exams.   Eat a healthy diet. Foods like vegetables, fruits, whole grains, low-fat dairy products, and lean protein foods contain the nutrients you need without too many calories. Decrease your intake of foods high in solid fats, added sugars, and salt. Eat the right amount of calories for you.Get information about a proper diet from your health care provider, if necessary.   Regular physical exercise is one of the most important things you can do for your health. Most adults should get at least 150 minutes of moderate-intensity exercise (any activity that increases your heart rate and causes you to sweat) each week. In addition, most adults need muscle-strengthening exercises on 2 or more days a week.   Maintain a healthy weight. The body mass index (BMI) is a screening tool to identify possible weight problems. It provides an estimate of body fat based on height and weight. Your health care provider can find your BMI, and can help you achieve or maintain a healthy weight.For adults 20 years and older:   - A BMI below 18.5 is considered underweight.   - A BMI of 18.5 to 24.9 is normal.   - A BMI of 25 to 29.9 is considered overweight.   - A BMI  of 30 and above is considered obese.   Maintain normal blood lipids and cholesterol levels by exercising and minimizing your intake of trans and saturated fats.  Eat a balanced diet with plenty of fruit and vegetables. Blood tests for lipids and cholesterol should begin at age 27 and be repeated every 5 years minimum.  If your lipid or cholesterol levels are high, you are over 40, or you are at high risk for heart disease, you  may need your cholesterol levels checked more frequently.Ongoing high lipid and cholesterol levels should be treated with medicines if diet and exercise are not working.   If you smoke, find out from your health care provider how to quit. If you do not use tobacco, do not start.   Lung cancer screening is recommended for adults aged 57-80 years who are at high risk for developing lung cancer because of a history of smoking. A yearly low-dose CT scan of the lungs is recommended for people who have at least a 30-pack-year history of smoking and are a current smoker or have quit within the past 15 years. A pack year of smoking is smoking an average of 1 pack of cigarettes a day for 1 year (for example: 1 pack a day for 30 years or 2 packs a day for 15 years). Yearly screening should continue until the smoker has stopped smoking for at least 15 years. Yearly screening should be stopped for people who develop a health problem that would prevent them from having lung cancer treatment.   If you are pregnant, do not drink alcohol. If you are breastfeeding, be very cautious about drinking alcohol. If you are not pregnant and choose to drink alcohol, do not have more than 1 drink per day. One drink is considered to be 12 ounces (355 mL) of beer, 5 ounces (148 mL) of wine, or 1.5 ounces (44 mL) of liquor.   Avoid use of street drugs. Do not share needles with anyone. Ask for help if you need support or instructions about stopping the use of drugs.   High blood pressure causes heart  disease and increases the risk of stroke. Your blood pressure should be checked at least yearly.  Ongoing high blood pressure should be treated with medicines if weight loss and exercise do not work.   If you are 16-65 years old, ask your health care provider if you should take aspirin to prevent strokes.   Diabetes screening involves taking a blood sample to check your fasting blood sugar level. This should be done once every 3 years, after age 24, if you are within normal weight and without risk factors for diabetes. Testing should be considered at a younger age or be carried out more frequently if you are overweight and have at least 1 risk factor for diabetes.   Breast cancer screening is essential preventive care for women. You should practice "breast self-awareness."  This means understanding the normal appearance and feel of your breasts and may include breast self-examination.  Any changes detected, no matter how small, should be reported to a health care provider.  Women in their 70s and 30s should have a clinical breast exam (CBE) by a health care provider as part of a regular health exam every 1 to 3 years.  After age 20, women should have a CBE every year.  Starting at age 1, women should consider having a mammogram (breast X-ray test) every year.  Women who have a family history of breast cancer should talk to their health care provider about genetic screening.  Women at a high risk of breast cancer should talk to their health care providers about having an MRI and a mammogram every year.   -Breast cancer gene (BRCA)-related cancer risk assessment is recommended for women who have family members with BRCA-related cancers. BRCA-related cancers include breast, ovarian, tubal, and peritoneal cancers. Having family members with these cancers may be associated with an increased risk for harmful changes (  mutations) in the breast cancer genes BRCA1 and BRCA2. Results of the assessment will  determine the need for genetic counseling and BRCA1 and BRCA2 testing.   The Pap test is a screening test for cervical cancer. A Pap test can show cell changes on the cervix that might become cervical cancer if left untreated. A Pap test is a procedure in which cells are obtained and examined from the lower end of the uterus (cervix).   - Women should have a Pap test starting at age 16.   - Between ages 87 and 78, Pap tests should be repeated every 2 years.   - Beginning at age 53, you should have a Pap test every 3 years as long as the past 3 Pap tests have been normal.   - Some women have medical problems that increase the chance of getting cervical cancer. Talk to your health care provider about these problems. It is especially important to talk to your health care provider if a new problem develops soon after your last Pap test. In these cases, your health care provider may recommend more frequent screening and Pap tests.   - The above recommendations are the same for women who have or have not gotten the vaccine for human papillomavirus (HPV).   - If you had a hysterectomy for a problem that was not cancer or a condition that could lead to cancer, then you no longer need Pap tests. Even if you no longer need a Pap test, a regular exam is a good idea to make sure no other problems are starting.   - If you are between ages 65 and 44 years, and you have had normal Pap tests going back 10 years, you no longer need Pap tests. Even if you no longer need a Pap test, a regular exam is a good idea to make sure no other problems are starting.   - If you have had past treatment for cervical cancer or a condition that could lead to cancer, you need Pap tests and screening for cancer for at least 20 years after your treatment.   - If Pap tests have been discontinued, risk factors (such as a new sexual partner) need to be reassessed to determine if screening should be resumed.   - The HPV test is an  additional test that may be used for cervical cancer screening. The HPV test looks for the virus that can cause the cell changes on the cervix. The cells collected during the Pap test can be tested for HPV. The HPV test could be used to screen women aged 40 years and older, and should be used in women of any age who have unclear Pap test results. After the age of 57, women should have HPV testing at the same frequency as a Pap test.   Colorectal cancer can be detected and often prevented. Most routine colorectal cancer screening begins at the age of 33 years and continues through age 3 years. However, your health care provider may recommend screening at an earlier age if you have risk factors for colon cancer. On a yearly basis, your health care provider may provide home test kits to check for hidden blood in the stool.  Use of a small camera at the end of a tube, to directly examine the colon (sigmoidoscopy or colonoscopy), can detect the earliest forms of colorectal cancer. Talk to your health care provider about this at age 76, when routine screening begins. Direct exam of the colon  should be repeated every 5 -10 years through age 79 years, unless early forms of pre-cancerous polyps or small growths are found.   People who are at an increased risk for hepatitis B should be screened for this virus. You are considered at high risk for hepatitis B if:  -You were born in a country where hepatitis B occurs often. Talk with your health care provider about which countries are considered high risk.  - Your parents were born in a high-risk country and you have not received a shot to protect against hepatitis B (hepatitis B vaccine).  - You have HIV or AIDS.  - You use needles to inject street drugs.  - You live with, or have sex with, someone who has Hepatitis B.  - You get hemodialysis treatment.  - You take certain medicines for conditions like cancer, organ transplantation, and autoimmune  conditions.   Hepatitis C blood testing is recommended for all people born from [birth year] through 1965 and any individual with known risks for hepatitis C.   Practice safe sex. Use condoms and avoid high-risk sexual practices to reduce the spread of sexually transmitted infections (STIs). STIs include gonorrhea, chlamydia, syphilis, trichomonas, herpes, HPV, and human immunodeficiency virus (HIV). Herpes, HIV, and HPV are viral illnesses that have no cure. They can result in disability, cancer, and death. Sexually active women aged 12 years and younger should be checked for chlamydia. Older women with new or multiple partners should also be tested for chlamydia. Testing for other STIs is recommended if you are sexually active and at increased risk.   Osteoporosis is a disease in which the bones lose minerals and strength with aging. This can result in serious bone fractures or breaks. The risk of osteoporosis can be identified using a bone density scan. Women ages 9 years and over and women at risk for fractures or osteoporosis should discuss screening with their health care providers. Ask your health care provider whether you should take a calcium supplement or vitamin D to There are also several preventive steps women can take to avoid osteoporosis and resulting fractures or to keep osteoporosis from worsening. -->Recommendations include:  Eat a balanced diet high in fruits, vegetables, calcium, and vitamins.  Get enough calcium. The recommended total intake of is 1,200 mg daily; for best absorption, if taking supplements, divide doses into 250-500 mg doses throughout the day. Of the two types of calcium, calcium carbonate is best absorbed when taken with food but calcium citrate can be taken on an empty stomach.  Get enough vitamin D. NAMS and the [place] recommend at least 1,000 IU per day for women age 35 and over who are at risk of vitamin D deficiency. Vitamin D deficiency  can be caused by inadequate sun exposure (for example, those who live in [place]).  Avoid alcohol and smoking. Heavy alcohol intake (more than 7 drinks per week) increases the risk of falls and hip fracture and women smokers tend to lose bone more rapidly and have lower bone mass than nonsmokers. Stopping smoking is one of the most important changes women can make to improve their health and decrease risk for disease.  Be physically active every day. Weight-bearing exercise (for example, fast walking, hiking, jogging, and weight training) may strengthen bones or slow the rate of bone loss that comes with aging. Balancing and muscle-strengthening exercises can reduce the risk of falling and fracture.  Consider therapeutic medications. Currently, several types of effective drugs are available. Healthcare  providers can recommend the type most appropriate for each woman.  Eliminate environmental factors that may contribute to accidents. Falls cause nearly 90% of all osteoporotic fractures, so reducing this risk is an important bone-health strategy. Measures include ample lighting, removing obstructions to walking, using nonskid rugs on floors, and placing mats and/or grab bars in showers.  Be aware of medication side effects. Some common medicines make bones weaker. These include a type of steroid drug called glucocorticoids used for arthritis and asthma, some antiseizure drugs, certain sleeping pills, treatments for endometriosis, and some cancer drugs. An overactive thyroid gland or using too much thyroid hormone for an underactive thyroid can also be a problem. If you are taking these medicines, talk to your doctor about what you can do to help protect your bones.reduce the rate of osteoporosis.    Menopause can be associated with physical symptoms and risks. Hormone replacement therapy is available to decrease symptoms and risks. You should talk to your health care provider about whether  hormone replacement therapy is right for you.   Use sunscreen. Apply sunscreen liberally and repeatedly throughout the day. You should seek shade when your shadow is shorter than you. Protect yourself by wearing long sleeves, pants, a wide-brimmed hat, and sunglasses year round, whenever you are outdoors.   Once a month, do a whole body skin exam, using a mirror to look at the skin on your back. Tell your health care provider of new moles, moles that have irregular borders, moles that are larger than a pencil eraser, or moles that have changed in shape or color.   -Stay current with required vaccines (immunizations).   Influenza vaccine. All adults should be immunized every year.  Tetanus, diphtheria, and acellular pertussis (Td, Tdap) vaccine. Pregnant women should receive 1 dose of Tdap vaccine during each pregnancy. The dose should be obtained regardless of the length of time since the last dose. Immunization is preferred during the 27th 36th week of gestation. An adult who has not previously received Tdap or who does not know her vaccine status should receive 1 dose of Tdap. This initial dose should be followed by tetanus and diphtheria toxoids (Td) booster doses every 10 years. Adults with an unknown or incomplete history of completing a 3-dose immunization series with Td-containing vaccines should begin or complete a primary immunization series including a Tdap dose. Adults should receive a Td booster every 10 years.  Varicella vaccine. An adult without evidence of immunity to varicella should receive 2 doses or a second dose if she has previously received 1 dose. Pregnant females who do not have evidence of immunity should receive the first dose after pregnancy. This first dose should be obtained before leaving the health care facility. The second dose should be obtained 4 8 weeks after the first dose.  Human papillomavirus (HPV) vaccine. Females aged 93 26 years who have not received the  vaccine previously should obtain the 3-dose series. The vaccine is not recommended for use in pregnant females. However, pregnancy testing is not needed before receiving a dose. If a female is found to be pregnant after receiving a dose, no treatment is needed. In that case, the remaining doses should be delayed until after the pregnancy. Immunization is recommended for any person with an immunocompromised condition through the age of 64 years if she did not get any or all doses earlier. During the 3-dose series, the second dose should be obtained 4 8 weeks after the first dose. The third dose should  be obtained 24 weeks after the first dose and 16 weeks after the second dose.  Zoster vaccine. One dose is recommended for adults aged 6 years or older unless certain conditions are present.  Measles, mumps, and rubella (MMR) vaccine. Adults born before [birth year] generally are considered immune to measles and mumps. Adults born in [birth year] or later should have 1 or more doses of MMR vaccine unless there is a contraindication to the vaccine or there is laboratory evidence of immunity to each of the three diseases. A routine second dose of MMR vaccine should be obtained at least 28 days after the first dose for students attending postsecondary schools, health care workers, or international travelers. People who received inactivated measles vaccine or an unknown type of measles vaccine during 1963 1967 should receive 2 doses of MMR vaccine. People who received inactivated mumps vaccine or an unknown type of mumps vaccine before 1979 and are at high risk for mumps infection should consider immunization with 2 doses of MMR vaccine. For females of childbearing age, rubella immunity should be determined. If there is no evidence of immunity, females who are not pregnant should be vaccinated. If there is no evidence of immunity, females who are pregnant should delay immunization until after pregnancy. Unvaccinated health care  workers born before [birth year] who lack laboratory evidence of measles, mumps, or rubella immunity or laboratory confirmation of disease should consider measles and mumps immunization with 2 doses of MMR vaccine or rubella immunization with 1 dose of MMR vaccine.  Pneumococcal 13-valent conjugate (PCV13) vaccine. When indicated, a person who is uncertain of her immunization history and has no record of immunization should receive the PCV13 vaccine. An adult aged 57 years or older who has certain medical conditions and has not been previously immunized should receive 1 dose of PCV13 vaccine. This PCV13 should be followed with a dose of pneumococcal polysaccharide (PPSV23) vaccine. The PPSV23 vaccine dose should be obtained at least 8 weeks after the dose of PCV13 vaccine. An adult aged 72 years or older who has certain medical conditions and previously received 1 or more doses of PPSV23 vaccine should receive 1 dose of PCV13. The PCV13 vaccine dose should be obtained 1 or more years after the last PPSV23 vaccine dose.  Pneumococcal polysaccharide (PPSV23) vaccine. When PCV13 is also indicated, PCV13 should be obtained first. All adults aged 54 years and older should be immunized. An adult younger than age 34 years who has certain medical conditions should be immunized. Any person who resides in a nursing home or long-term care facility should be immunized. An adult smoker should be immunized. People with an immunocompromised condition and certain other conditions should receive both PCV13 and PPSV23 vaccines. People with human immunodeficiency virus (HIV) infection should be immunized as soon as possible after diagnosis. Immunization during chemotherapy or radiation therapy should be avoided. Routine use of PPSV23 vaccine is not recommended for American Indians, [place] Natives, or people younger than 65 years unless there are medical conditions that require PPSV23 vaccine. When indicated, people who have unknown  immunization and have no record of immunization should receive PPSV23 vaccine. One-time revaccination 5 years after the first dose of PPSV23 is recommended for people aged 19 64 years who have chronic kidney failure, nephrotic syndrome, asplenia, or immunocompromised conditions. People who received 1 2 doses of PPSV23 before age 76 years should receive another dose of PPSV23 vaccine at age 50 years or later if at least 5 years have passed since the previous dose.  Doses of PPSV23 are not needed for people immunized with PPSV23 at or after age 14 years.  Meningococcal vaccine. Adults with asplenia or persistent complement component deficiencies should receive 2 doses of quadrivalent meningococcal conjugate (MenACWY-D) vaccine. The doses should be obtained at least 2 months apart. Microbiologists working with certain meningococcal bacteria, [place] recruits, people at risk during an outbreak, and people who travel to or live in countries with a high rate of meningitis should be immunized. A first-year college student up through age 8 years who is living in a residence hall should receive a dose if she did not receive a dose on or after her 16th birthday. Adults who have certain high-risk conditions should receive one or more doses of vaccine.  Hepatitis A vaccine. Adults who wish to be protected from this disease, have certain high-risk conditions, work with hepatitis A-infected animals, work in hepatitis A research labs, or travel to or work in countries with a high rate of hepatitis A should be immunized. Adults who were previously unvaccinated and who anticipate close contact with an international adoptee during the first 60 days after arrival in the [place] from a country with a high rate of hepatitis A should be immunized.  Hepatitis B vaccine.  Adults who wish to be protected from this disease, have certain high-risk conditions, may be exposed to blood or other infectious body fluids, are household  contacts or sex partners of hepatitis B positive people, are clients or workers in certain care facilities, or travel to or work in countries with a high rate of hepatitis B should be immunized.  Haemophilus influenzae type b (Hib) vaccine. A previously unvaccinated person with asplenia or sickle cell disease or having a scheduled splenectomy should receive 1 dose of Hib vaccine. Regardless of previous immunization, a recipient of a hematopoietic stem cell transplant should receive a 3-dose series 6 12 months after her successful transplant. Hib vaccine is not recommended for adults with HIV infection.  Preventive Services / Frequency Ages 69 to 39years  Blood pressure check.** / Every 1 to 2 years.  Lipid and cholesterol check.** / Every 5 years beginning at age 54.  Clinical breast exam.** / Every 3 years for women in their 77s and 69s.  BRCA-related cancer risk assessment.** / For women who have family members with a BRCA-related cancer (breast, ovarian, tubal, or peritoneal cancers).  Pap test.** / Every 2 years from ages 58 through 3. Every 3 years starting at age 44 through age 59 or 68 with a history of 3 consecutive normal Pap tests.  HPV screening.** / Every 3 years from ages 78 through ages 105 to 54 with a history of 3 consecutive normal Pap tests.  Hepatitis C blood test.** / For any individual with known risks for hepatitis C.  Skin self-exam. / Monthly.  Influenza vaccine. / Every year.  Tetanus, diphtheria, and acellular pertussis (Tdap, Td) vaccine.** / Consult your health care provider. Pregnant women should receive 1 dose of Tdap vaccine during each pregnancy. 1 dose of Td every 10 years.  Varicella vaccine.** / Consult your health care provider. Pregnant females who do not have evidence of immunity should receive the first dose after pregnancy.  HPV vaccine. / 3 doses over 6 months, if 40 and younger. The vaccine is not recommended for use in pregnant females. However,  pregnancy testing is not needed before receiving a dose.  Measles, mumps, rubella (MMR) vaccine.** / You need at least 1 dose of MMR if you  were born in [birth year] or later. You may also need a 2nd dose. For females of childbearing age, rubella immunity should be determined. If there is no evidence of immunity, females who are not pregnant should be vaccinated. If there is no evidence of immunity, females who are pregnant should delay immunization until after pregnancy.  Pneumococcal 13-valent conjugate (PCV13) vaccine.** / Consult your health care provider.  Pneumococcal polysaccharide (PPSV23) vaccine.** / 1 to 2 doses if you smoke cigarettes or if you have certain conditions.  Meningococcal vaccine.** / 1 dose if you are age 56 to 50 years and a Market researcher living in a residence hall, or have one of several medical conditions, you need to get vaccinated against meningococcal disease. You may also need additional booster doses.  Hepatitis A vaccine.** / Consult your health care provider.  Hepatitis B vaccine.** / Consult your health care provider.  Haemophilus influenzae type b (Hib) vaccine.** / Consult your health care provider.  Ages 26 to 64years  Blood pressure check.** / Every 1 to 2 years.  Lipid and cholesterol check.** / Every 5 years beginning at age 70 years.  Lung cancer screening. / Every year if you are aged 55 80 years and have a 30-pack-year history of smoking and currently smoke or have quit within the past 15 years. Yearly screening is stopped once you have quit smoking for at least 15 years or develop a health problem that would prevent you from having lung cancer treatment.  Clinical breast exam.** / Every year after age 74 years.  BRCA-related cancer risk assessment.** / For women who have family members with a BRCA-related cancer (breast, ovarian, tubal, or peritoneal cancers).  Mammogram.** / Every year beginning at age 90 years and continuing for as  long as you are in good health. Consult with your health care provider.  Pap test.** / Every 3 years starting at age 82 years through age 12 or 65 years with a history of 3 consecutive normal Pap tests.  HPV screening.** / Every 3 years from ages 43 years through ages 7 to 54 years with a history of 3 consecutive normal Pap tests.  Fecal occult blood test (FOBT) of stool. / Every year beginning at age 60 years and continuing until age 6 years. You may not need to do this test if you get a colonoscopy every 10 years.  Flexible sigmoidoscopy or colonoscopy.** / Every 5 years for a flexible sigmoidoscopy or every 10 years for a colonoscopy beginning at age 81 years and continuing until age 43 years.  Hepatitis C blood test.** / For all people born from [birth year] through 1965 and any individual with known risks for hepatitis C.  Skin self-exam. / Monthly.  Influenza vaccine. / Every year.  Tetanus, diphtheria, and acellular pertussis (Tdap/Td) vaccine.** / Consult your health care provider. Pregnant women should receive 1 dose of Tdap vaccine during each pregnancy. 1 dose of Td every 10 years.  Varicella vaccine.** / Consult your health care provider. Pregnant females who do not have evidence of immunity should receive the first dose after pregnancy.  Zoster vaccine.** / 1 dose for adults aged 20 years or older.  Measles, mumps, rubella (MMR) vaccine.** / You need at least 1 dose of MMR if you were born in 1957 or later. You may also need a 2nd dose. For females of childbearing age, rubella immunity should be determined. If there is no evidence of immunity, females who are not pregnant should be vaccinated. If there  is no evidence of immunity, females who are pregnant should delay immunization until after pregnancy.  Pneumococcal 13-valent conjugate (PCV13) vaccine.** / Consult your health care provider.  Pneumococcal polysaccharide (PPSV23) vaccine.** / 1 to 2 doses if you smoke cigarettes or if  you have certain conditions.  Meningococcal vaccine.** / Consult your health care provider.  Hepatitis A vaccine.** / Consult your health care provider.  Hepatitis B vaccine.** / Consult your health care provider.  Haemophilus influenzae type b (Hib) vaccine.** / Consult your health care provider.  Ages 71 years and over  Blood pressure check.** / Every 1 to 2 years.  Lipid and cholesterol check.** / Every 5 years beginning at age 17 years.  Lung cancer screening. / Every year if you are aged 28 80 years and have a 30-pack-year history of smoking and currently smoke or have quit within the past 15 years. Yearly screening is stopped once you have quit smoking for at least 15 years or develop a health problem that would prevent you from having lung cancer treatment.  Clinical breast exam.** / Every year after age 62 years.  BRCA-related cancer risk assessment.** / For women who have family members with a BRCA-related cancer (breast, ovarian, tubal, or peritoneal cancers).  Mammogram.** / Every year beginning at age 40 years and continuing for as long as you are in good health. Consult with your health care provider.  Pap test.** / Every 3 years starting at age 22 years through age 15 or 39 years with 3 consecutive normal Pap tests. Testing can be stopped between 65 and 70 years with 3 consecutive normal Pap tests and no abnormal Pap or HPV tests in the past 10 years.  HPV screening.** / Every 3 years from ages 22 years through ages 52 or 78 years with a history of 3 consecutive normal Pap tests. Testing can be stopped between 65 and 70 years with 3 consecutive normal Pap tests and no abnormal Pap or HPV tests in the past 10 years.  Fecal occult blood test (FOBT) of stool. / Every year beginning at age 65 years and continuing until age 42 years. You may not need to do this test if you get a colonoscopy every 10 years.  Flexible sigmoidoscopy or colonoscopy.** / Every 5 years for a  flexible sigmoidoscopy or every 10 years for a colonoscopy beginning at age 72 years and continuing until age 14 years.  Hepatitis C blood test.** / For all people born from [birth year] through 1965 and any individual with known risks for hepatitis C.  Osteoporosis screening.** / A one-time screening for women ages 59 years and over and women at risk for fractures or osteoporosis.  Skin self-exam. / Monthly.  Influenza vaccine. / Every year.  Tetanus, diphtheria, and acellular pertussis (Tdap/Td) vaccine.** / 1 dose of Td every 10 years.  Varicella vaccine.** / Consult your health care provider.  Zoster vaccine.** / 1 dose for adults aged 25 years or older.  Pneumococcal 13-valent conjugate (PCV13) vaccine.** / Consult your health care provider.  Pneumococcal polysaccharide (PPSV23) vaccine.** / 1 dose for all adults aged 88 years and older.  Meningococcal vaccine.** / Consult your health care provider.  Hepatitis A vaccine.** / Consult your health care provider.  Hepatitis B vaccine.** / Consult your health care provider.  Haemophilus influenzae type b (Hib) vaccine.** / Consult your health care provider. ** Family history and personal history of risk and conditions may change your health care provider's recommendations. Document Released: 10/14/2001 Document Revised:  06/08/2013  Exit[employer] Patient Information 2014 [place].   EXERCISE AND DIET:  We recommended that you start or continue a regular exercise program for good health. Regular exercise means any activity that makes your heart beat faster and makes you sweat.  We recommend exercising at least 30 minutes per day at least 3 days a week, preferably 5.  We also recommend a diet low in fat and sugar / carbohydrates.  Inactivity, poor dietary choices and obesity can cause diabetes, heart attack, stroke, and kidney damage, among others.     ALCOHOL AND SMOKING:  Women should limit their alcohol intake to no more than 7  drinks/beers/glasses of wine (combined, not each!) per week. Moderation of alcohol intake to this level decreases your risk of breast cancer and liver damage.  ( And of course, no recreational drugs are part of a healthy lifestyle.)  Also, you should not be smoking at all or even being exposed to second hand smoke. Most people know smoking can cause cancer, and various heart and lung diseases, but did you know it also contributes to weakening of your bones?  Aging of your skin?  Yellowing of your teeth and nails?   CALCIUM AND VITAMIN D:  Adequate intake of calcium and Vitamin D are recommended.  The recommendations for exact amounts of these supplements seem to change often, but generally speaking 600 mg of calcium (either carbonate or citrate) and 800 units of Vitamin D per day seems prudent. Certain women may benefit from higher intake of Vitamin D.  If you are among these women, your doctor will have told you during your visit.     PAP SMEARS:  Pap smears, to check for cervical cancer or precancers,  have traditionally been done yearly, although recent scientific advances have shown that most women can have pap smears less often.  However, every woman still should have a physical exam from her gynecologist or primary care physician every year. It will include a breast check, inspection of the vulva and vagina to check for abnormal growths or skin changes, a visual exam of the cervix, and then an exam to evaluate the size and shape of the uterus and ovaries.  And after 59 years of age, a rectal exam is indicated to check for rectal cancers. We will also provide age appropriate advice regarding health maintenance, like when you should have certain vaccines, screening for sexually transmitted diseases, bone density testing, colonoscopy, mammograms, etc.    MAMMOGRAMS:  All women over 17 years old should have a yearly mammogram. Many facilities now offer a "3D" mammogram, which may cost around $50 extra  out of pocket. If possible,  we recommend you accept the option to have the 3D mammogram performed.  It both reduces the number of women who will be called back for extra views which then turn out to be normal, and it is better than the routine mammogram at detecting truly abnormal areas.     COLONOSCOPY:  Colonoscopy to screen for colon cancer is recommended for all women at age 21.  We know, you hate the idea of the prep.  We agree, BUT, having colon cancer and not knowing it is worse!!  Colon cancer so often starts as a polyp that can be seen and removed at colonscopy, which can quite literally save your life!  And if your first colonoscopy is normal and you have no family history of colon cancer, most women don't have to have it again for 10  years.  Once every ten years, you can do something that may end up saving your life, right?  We will be happy to help you get it scheduled when you are ready.  Be sure to check your insurance coverage so you understand how much it will cost.  It may be covered as a preventative service at no cost, but you should check your particular policy.

## 2019-09-16 NOTE — Progress Notes (Signed)
Impression and Recommendations:    1. Encounter for wellness examination   2. Health education/counseling   3. Need for shingles vaccine   4. Screening for HIV (human immunodeficiency virus)   5. Encounter for hepatitis C screening test for low risk patient   6. Mixed hyperlipidemia   7. Age related osteoporosis, unspecified pathological fracture presence   8. Internal hemorrhoids without complication   9. Arthritis- bilateral elbows and bilateral knees   10. Chronic constipation     1) Anticipatory Guidance: Discussed importance of wearing a seatbelt while driving, not texting while driving; sunscreen when outside along with yearly skin surveillance; eating a well balanced and modest diet; physical activity at least 25 minutes per day or 150 min/ week of moderate to intense activity.  - Discussed prudent skin surveillance habits with patient during appointment today.  Reviewed A,B,C,D's of skin surveillance and encouraged patient to engage in self-skin checks as recommended.  - Encouraged patient to engage in prudent self-breast exams monthly at home, as reviewed during appointment.  2) Immunizations / Screenings / Labs:   All immunizations and screenings that patient agrees to, are up-to-date per recommendations or will be updated today.  Patient understands the needs for q 41mo dental and yearly vision screens which pt will schedule independently. Obtain CBC, CMP, HgA1c, Lipid panel, TSH and vit D when fasting if not already done recently.   - Last mammogram obtained 08/16/2019, normal, repeat imaging one year.  - Last colonoscopy done 07/22/2019 with Dr. Bonna Gains; one polyp found with repeat imaging 5 years.  - Last DEXA obtained 08/16/2019, repeat in two years.  To help improve bone density, encouraged patient to engage in weight bearing exercise, prudent weight lifting, and calcium supplementation to help prevent osteopenia diagnosis.  Continue calcium supplementation.   Educational handout provided.  - Need for shingles vaccination.  - Encouraged patient to continue to hydrate and use MIralax / stool softener prudently, to keep her stools soft and regular.  - Conversation held regarding appropriate maintenance as a patient.  Discussed policies and practices here at the clinic, and answered all questions about care team and health management during appointment.  - Discussed need for patient to continue to obtain management and screenings with all established specialists.  Educated patient at length about the critical importance of keeping health maintenance up to date.  3) Weight:   Discussed goal of improving nutrient density of diet through increasing intake of fruits and vegetables and decreasing saturated/trans fats, white flour products and refined sugar products.   - Advised patient to continue working toward exercising to improve overall mental, physical, and emotional health.    - Encouraged patient to engage in daily physical activity, especially a formal exercise routine.  Recommended that the patient eventually strive for at least 150 minutes of moderate cardiovascular activity per week according to guidelines established by the [facility].   - Healthy dietary habits encouraged, including low-carb, and high amounts of lean protein in diet.   - Patient should also consume adequate amounts of water.  - Health counseling performed.  All questions answered.  4) Recommendations: - Return for follow-up in 6 months, sooner if concerns.   No orders of the defined types were placed in this encounter.   Orders Placed This Encounter  Procedures  . Varicella-zoster vaccine IM  . CBC w/Diff  . CMP (comprehensive metabolic panel)  . TSH  . T4, free  . Hemoglobin A1c  . Lipid panel  .  VITAMIN D 25 Hydroxy (Vit-D Deficiency, Fractures)  . Hepatitis C Antibody  . HIV antibody (with reflex)     Return for f/up 6 months, sooner if concerns arise or lab  abnormality.   Reminded pt important of f-up preventative CPE in 1 year.  Reminded pt again, this is in addition to any chronic care visits.    Gross side effects, risk and benefits, and alternatives of medications discussed with patient.  Patient is aware that all medications have potential side effects and we are unable to predict every side effect or drug-drug interaction that may occur.  Expresses verbal understanding and consents to current therapy plan and treatment regimen.  F-up preventative CPE in 1 year- reminded pt again, this is in addition to any chronic care visits.    Please see orders placed and AVS handed out to patient at the end of our visit for further patient instructions/ counseling done pertaining to today's office visit.  This document serves as a record of services personally performed by Mellody Dance, DO. It was created on her behalf by Toni Amend, a trained medical scribe. The creation of this record is based on the scribe's personal observations and the provider's statements to them.   This case required medical decision making of at least moderate complexity. The above documentation has been reviewed to be accurate and was completed by Marjory Sneddon, D.O.     Subjective:    Chief Complaint  Patient presents with  . Annual Exam    HPI: Rachael Clark is a 59 y.o. female who presents to [facility] today a yearly health maintenance exam.  Health Maintenance Summary Reviewed and updated, unless pt declines services.  Colonoscopy:  Obtained 07/22/2019; Rachael Clark, per patient, was told to return in 5 years.  No family history of colon cancer reported. Tobacco History Reviewed:  Y; quit smoking in 1997; history of 0.25 ppd, 1 pack year.  Alcohol:  No concerns, no excessive use Exercise Habits:  Not meeting AHA guidelines STD concerns:  None reported. Drug Use:  None reported. Birth control  method:  History of total vaginal hysterectomy in 2007; patient still has tubes and ovaries. Menses regular:  S/p hysterectomy. Lumps or breast concerns:  None reported. Breast Cancer Family History:  None reported. Bone/ DEXA scan:  Patient has osteopenia.  Last DEXA recently obtained, December 2020.  Patient states her "bone density is no good."  Was formerly taking Fosamax 4-5 years ago, but stopped.  Patient says she was told she was doing fine and told to stop by her former doctor in [place].  She continues working at [employer].  Confirms feeling well emotionally; wearing her mask wherever she goes.  Has been tested for COVID-19 twice and notes "it was negative."  - Visual Health Last eye exam was two years ago.  - Female Health Has been told to follow up yearly with [facility] went to the dentist two years ago.  - Dermatological [place] Denies dermatological concerns.  - Digestive Health Denies nausea, vomiting, GI concerns.  Notes sometimes has constipation; eats more fiber or takes Miralax to treat.  She is currently eating more fiber.  Patient states she has a history of non-bleeding internal hemorrhoids.  Notes her hernia doctor told her to go to the gastroenterologist for follow-up.  Patient notes she obtained colonoscopy on this recommendation.  Says everything was okay with her hernia surgery  and she has no plans to follow-up with surgeon.    Immunization History  Administered Date(s) Administered  . Influenza,inj,Quad PF,6+ Mos 07/08/2017, 09/15/2018, 06/10/2019  . Tdap 07/08/2017  . Zoster Recombinat (Shingrix) 09/16/2019    Health Maintenance  Topic Date Due  . Hepatitis C Screening  03/25/2029 (Originally 02-15-1961)  . HIV Screening  03/25/2029 (Originally 03/25/1976)  . MAMMOGRAM  08/15/2021  . DEXA SCAN  08/15/2021  . COLONOSCOPY  07/21/2024  . TETANUS/TDAP  07/09/2027  . INFLUENZA VACCINE  Completed  . PAP SMEAR-Modifier  Discontinued      Wt Readings from Last 3 Encounters:  09/16/19 133 lb 4.8 oz (60.5 kg)  07/22/19 129 lb (58.5 kg)  06/30/19 127 lb 8 oz (57.8 kg)   BP Readings from Last 3 Encounters:  09/16/19 111/72  07/22/19 102/61  06/30/19 (!) 141/65   Pulse Readings from Last 3 Encounters:  09/16/19 77  07/22/19 67  06/30/19 82     Past Medical History:  Diagnosis Date  . Benign breast lumps   . History of kidney stones   . Hyperlipidemia   . Kidney stone   . Osteopenia   . PONV (postoperative nausea and vomiting)       Past Surgical History:  Procedure Laterality Date  . ABDOMINAL HYSTERECTOMY    . ABDOMINOPLASTY    . APPENDECTOMY    . BREAST BIOPSY Bilateral   . BREAST LUMPECTOMY Bilateral   . CESAREAN SECTION     x 1  . CHOLECYSTECTOMY    . COLONOSCOPY WITH PROPOFOL N/A 07/22/2019   Procedure: COLONOSCOPY WITH PROPOFOL;  Surgeon: Virgel Manifold, MD;  Location: [facility] ENDOSCOPY;  Service: Endoscopy;  Laterality: N/A;  . INCONTINENCE SURGERY     TVT  . INGUINAL HERNIA REPAIR Right 05/02/2019   Procedure: LAPAROSCOPIC RIGHT INGUINAL HERNIA REPAIR;  Surgeon: Ralene Ok, MD;  Location: [place];  Service: General;  Laterality: Right;  . INSERTION OF MESH Right 05/02/2019   Procedure: Insertion Of Mesh;  Surgeon: Ralene Ok, MD;  Location: [place];  Service: General;  Laterality: Right;  . NOSE SURGERY    . OVARIAN CYST REMOVAL    . TONSILLECTOMY    . TOTAL VAGINAL HYSTERECTOMY  2007   still has her tubes and ovaries  . UMBILICAL HERNIA REPAIR    . WRIST SURGERY Right       Family History  Problem Relation Age of Onset  . Osteoporosis Mother   . Dementia Mother   . Osteopenia Mother   . Healthy Father   . Healthy Sister   . Healthy Brother   . Healthy Daughter   . Healthy Son   . Healthy Sister   . Healthy Sister   . Healthy Brother   . Healthy Son   . Uterine cancer Maternal Grandmother       Social History   Substance and Sexual Activity  Drug Use  No  ,   Social History   Substance and Sexual Activity  Alcohol Use Yes  . Alcohol/week: 4.0 standard drinks  . Types: 4 Glasses of wine per week  ,   Social History   Tobacco Use  Smoking Status Former Smoker  . Packs/day: 0.25  . Years: 4.00  . Pack years: 1.00  . Quit date: 05/02/1996  . Years since quitting: 23.3  Smokeless Tobacco Never Used  ,   Social History   Substance and Sexual Activity  Sexual Activity Yes  . Partners: Male  . Birth  control/protection: Surgical    Current Outpatient Medications on File Prior to Visit  Medication Sig Dispense Refill  . acetaminophen (TYLENOL) 650 MG CR tablet Take 650 mg by mouth at bedtime as needed for pain.    . Ascorbic Acid (VITAMIN C) 1000 MG tablet Take 1,000 mg by mouth daily.    . Biotin (BIOTIN 5000) 5 MG CAPS Take 5 mg by mouth daily.    . Calcium Carb-Cholecalciferol (CALCIUM 600+D) 600-800 MG-UNIT TABS Take 1 tablet by mouth daily.    . cholecalciferol (VITAMIN D3) 25 MCG (1000 UT) tablet Take 1,000 Units by mouth daily.    . Cyanocobalamin (B-12) 2500 MCG TABS Take 2,500 mcg by mouth daily.    . Magnesium 250 MG TABS Take 250 mg by mouth daily.    . Psyllium (METAMUCIL PO) Take 1 Dose by mouth daily as needed (constipation).    . simvastatin (ZOCOR) 10 MG tablet TAKE 1 TABLET BY MOUTH EVERYDAY AT BEDTIME (Patient taking differently: Take 10 mg by mouth at bedtime. ) 90 tablet 1  . Turmeric 500 MG CAPS Take 500 mg by mouth daily.    . vitamin E 400 UNIT capsule Take 400 Units by mouth daily.    Merril Abbe 10 MCG TABS vaginal tablet INSERT 1 TABLET TWICE A WEEK INTO VAGINA AS DIRECTED (Patient taking differently: Place 10 mcg vaginally 2 (two) times a week. ) 24 tablet 0  . bisacodyl (BISACODYL) 5 MG EC tablet Take 2 tablets (10mg ) by mouth the day before your procedure between 1pm-3pm. 30 tablet 0  . HYDROcodone-acetaminophen (NORCO/VICODIN) 5-325 MG tablet Take 1-2 tablets every 6 hours as needed for severe pain  (Patient not taking: Reported on 09/16/2019) 8 tablet 0   No current facility-administered medications on file prior to visit.    Allergies: Patient has no known allergies.  Review of Systems: General:   Denies fever, chills, unexplained weight loss.  Optho/Auditory:   Denies visual changes, blurred vision/LOV Respiratory:   Denies SOB, DOE more than baseline levels.   Cardiovascular:   Denies chest pain, palpitations, new onset peripheral edema  Gastrointestinal:   Denies nausea, vomiting, diarrhea.  Genitourinary: Denies dysuria, freq/ urgency, flank pain or discharge from genitals.  Endocrine:     Denies hot or cold intolerance, polyuria, polydipsia. Musculoskeletal:   Denies unexplained myalgias, joint swelling, unexplained arthralgias, gait problems.  Skin:  Denies rash, suspicious lesions Neurological:     Denies dizziness, unexplained weakness, numbness  Psychiatric/Behavioral:   Denies mood changes, suicidal or homicidal ideations, hallucinations    Objective:    Blood pressure 111/72, pulse 77, temperature (!) 97.4 F (36.3 C), temperature source Oral, resp. rate 12, height 5' 2.21" (1.58 m), weight 133 lb 4.8 oz (60.5 kg), SpO2 98 %. Body mass index is 24.22 kg/m. General Appearance:    Alert, cooperative, no distress, appears stated age  Head:    Normocephalic, without obvious abnormality, atraumatic  Eyes:    PERRL, conjunctiva/corneas clear, EOM's intact, fundi    benign, both eyes  Ears:    Normal TM's and external ear canals, both ears  Nose:   Nares normal, septum midline, mucosa normal, no drainage    or sinus tenderness  Throat:   Lips w/o lesion, mucosa moist, and tongue normal; teeth and   gums normal  Neck:   Supple, symmetrical, trachea midline, no adenopathy;    thyroid:  no enlargement/tenderness/nodules; no carotid   bruit or JVD  Back:     Symmetric, no  curvature, ROM normal, no CVA tenderness  Lungs:     Clear to auscultation bilaterally, respirations  unlabored, no       Wh/ R/ R  Chest Wall:    No tenderness or gross deformity; normal excursion   Heart:    Regular rate and rhythm, S1 and S2 normal, no murmur, rub   or gallop  Breast Exam:     No tenderness, masses, or nipple abnormality b/l; no d/c  Abdomen:     Soft, non-tender, bowel sounds active all four quadrants, NO   G/R/R, no masses, no organomegaly  Genitalia:    Deferred to gynecology.  Rectal:    Deferred to gynecology.  Extremities:   Extremities normal, atraumatic, no cyanosis or gross edema  Pulses:   2+ and symmetric all extremities  Skin:   Warm, dry, Skin color, texture, turgor normal, no obvious rashes or lesions Psych: No HI/SI, judgement and insight good, Euthymic mood. Full Affect.  Neurologic:   CNII-XII intact, normal strength, sensation and reflexes    Throughout

## 2019-09-17 LAB — LIPID PANEL
Chol/HDL Ratio: 2.3 ratio (ref 0.0–4.4)
Cholesterol, Total: 213 mg/dL — ABNORMAL HIGH (ref 100–199)
HDL: 93 mg/dL (ref >39)
LDL Chol Calc (NIH): 99 mg/dL (ref 0–99)
Triglycerides: 121 mg/dL (ref 0–149)
VLDL Cholesterol Cal: 21 mg/dL (ref 5–40)

## 2019-09-17 LAB — COMPREHENSIVE METABOLIC PANEL
ALT: 17 IU/L (ref 0–32)
AST: 18 IU/L (ref 0–40)
Albumin/Globulin Ratio: 1.6 (ref 1.2–2.2)
Albumin: 4.5 g/dL (ref 3.8–4.9)
Alkaline Phosphatase: 70 IU/L (ref 39–117)
BUN/Creatinine Ratio: 17 (ref 9–23)
BUN: 11 mg/dL (ref 6–24)
Bilirubin Total: 1.2 mg/dL (ref 0.0–1.2)
CO2: 24 mmol/L (ref 20–29)
Calcium: 9.7 mg/dL (ref 8.7–10.2)
Chloride: 106 mmol/L (ref 96–106)
Creatinine, Ser: 0.64 mg/dL (ref 0.57–1.00)
GFR calc Af Amer: 114 mL/min/{1.73_m2} (ref >59)
GFR calc non Af Amer: 99 mL/min/{1.73_m2} (ref >59)
Globulin, Total: 2.8 g/dL (ref 1.5–4.5)
Glucose: 106 mg/dL — ABNORMAL HIGH (ref 65–99)
Potassium: 4.5 mmol/L (ref 3.5–5.2)
Sodium: 142 mmol/L (ref 134–144)
Total Protein: 7.3 g/dL (ref 6.0–8.5)

## 2019-09-17 LAB — CBC WITH DIFFERENTIAL/PLATELET
Basophils Absolute: 0.1 10*3/uL (ref 0.0–0.2)
Basos: 1 %
EOS (ABSOLUTE): 0.2 10*3/uL (ref 0.0–0.4)
Eos: 3 %
Hematocrit: 42 % (ref 34.0–46.6)
Hemoglobin: 13.8 g/dL (ref 11.1–15.9)
Immature Grans (Abs): 0 10*3/uL (ref 0.0–0.1)
Immature Granulocytes: 0 %
Lymphocytes Absolute: 2.2 10*3/uL (ref 0.7–3.1)
Lymphs: 34 %
MCH: 31.1 pg (ref 26.6–33.0)
MCHC: 32.9 g/dL (ref 31.5–35.7)
MCV: 95 fL (ref 79–97)
Monocytes Absolute: 0.5 10*3/uL (ref 0.1–0.9)
Monocytes: 8 %
Neutrophils Absolute: 3.5 10*3/uL (ref 1.4–7.0)
Neutrophils: 54 %
Platelets: 253 10*3/uL (ref 150–450)
RBC: 4.44 x10E6/uL (ref 3.77–5.28)
RDW: 12 % (ref 11.7–15.4)
WBC: 6.5 10*3/uL (ref 3.4–10.8)

## 2019-09-17 LAB — VITAMIN D 25 HYDROXY (VIT D DEFICIENCY, FRACTURES): Vit D, 25-Hydroxy: 44.7 ng/mL (ref 30.0–100.0)

## 2019-09-17 LAB — HEMOGLOBIN A1C
Est. average glucose Bld gHb Est-mCnc: 114 mg/dL
Hgb A1c MFr Bld: 5.6 % (ref 4.8–5.6)

## 2019-09-17 LAB — HEPATITIS C ANTIBODY: Hep C Virus Ab: <0.1 s/co ratio (ref 0.0–0.9)

## 2019-09-17 LAB — HIV ANTIBODY (ROUTINE TESTING W REFLEX): HIV Screen 4th Generation wRfx: NONREACTIVE

## 2019-09-17 LAB — TSH: TSH: 1.46 u[IU]/mL (ref 0.450–4.500)

## 2019-09-17 LAB — T4, FREE: Free T4: 1.32 ng/dL (ref 0.82–1.77)

## 2019-09-22 ENCOUNTER — Encounter: Payer: Self-pay | Admitting: Family Medicine

## 2019-11-14 DIAGNOSIS — H524 Presbyopia: Secondary | ICD-10-CM | POA: Diagnosis not present

## 2019-11-14 DIAGNOSIS — D3131 Benign neoplasm of right choroid: Secondary | ICD-10-CM | POA: Diagnosis not present

## 2019-11-14 DIAGNOSIS — H2513 Age-related nuclear cataract, bilateral: Secondary | ICD-10-CM | POA: Diagnosis not present

## 2019-12-11 ENCOUNTER — Other Ambulatory Visit: Payer: Self-pay | Admitting: Family Medicine

## 2019-12-11 DIAGNOSIS — E782 Mixed hyperlipidemia: Secondary | ICD-10-CM

## 2020-01-06 ENCOUNTER — Encounter: Payer: Self-pay | Admitting: Physician Assistant

## 2020-01-06 ENCOUNTER — Ambulatory Visit: Payer: BC Managed Care – PPO | Admitting: Physician Assistant

## 2020-01-06 ENCOUNTER — Other Ambulatory Visit: Payer: Self-pay

## 2020-01-06 VITALS — BP 114/70 | HR 73 | Temp 97.6°F | Ht 64.0 in | Wt 131.9 lb

## 2020-01-06 DIAGNOSIS — M159 Polyosteoarthritis, unspecified: Secondary | ICD-10-CM | POA: Diagnosis not present

## 2020-01-06 MED ORDER — MELOXICAM 15 MG PO TABS: 15.0000 mg | ORAL_TABLET | Freq: Every day | ORAL | 1 refills | Status: DC

## 2020-01-06 NOTE — Progress Notes (Signed)
Acute Office Visit  Subjective:    Patient ID: Rachael Clark, female    DOB: [birth year], 59 y.o.   MRN: [id number]  Chief Complaint  Patient presents with  . Arm Pain  . Hand Pain  . Hip Pain    HPI Patient is in today for generalized joint pain, mostly elbows and hands. States her pain is worse at night and has taken Tylenol and Aleve with minimal relief. Her pain started last year and feels like it is worsening. She has noticed weather exacerbates her symptoms, especially winter. She reports intermittent hand numbness and joint swelling. In the mornings she has joint stiffness and pain which gets better as she becomes more active with her daily activities. States with her job she does lots of movement with her upper body and hands. Sometimes she isn't able to overreach because of her elbow pain. She also has low back pain and  had an MRI done which revealed spinal stenosis. Pt denies rash, weight loss, muscle weakness or increased fatigue from baseline.   Past Medical History:  Diagnosis Date  . Benign breast lumps   . History of kidney stones   . Hyperlipidemia   . Kidney stone   . Osteopenia   . PONV (postoperative nausea and vomiting)     Past Surgical History:  Procedure Laterality Date  . ABDOMINAL HYSTERECTOMY    . ABDOMINOPLASTY    . APPENDECTOMY    . BREAST BIOPSY Bilateral   . BREAST LUMPECTOMY Bilateral   . CESAREAN SECTION     x 1  . CHOLECYSTECTOMY    . COLONOSCOPY WITH PROPOFOL N/A 07/22/2019   Procedure: COLONOSCOPY WITH PROPOFOL;  Surgeon: Virgel Manifold, MD;  Location: [facility] END[facility];  Service: Endoscopy;  Laterality: N/A;  . INCONTINENCE SURGERY     TVT  . INGUINAL HERNIA REPAIR Right 05/02/2019   Procedure: LAPAROSCOPIC RIGHT INGUINAL HERNIA REPAIR;  Surgeon: Ralene Ok, MD;  Location: [place];  Service: General;  Laterality: Right;  . INSERTION OF MESH Right 05/02/2019   Procedure: Insertion Of Mesh;  Surgeon: Ralene Ok, MD;   Location: [place];  Service: General;  Laterality: Right;  . NOSE SURGERY    . OVARIAN CYST REMOVAL    . TONSILLECTOMY    . TOTAL VAGINAL HYSTERECTOMY  2007   still has her tubes and ovaries  . UMBILICAL HERNIA REPAIR    . WRIST SURGERY Right     Family History  Problem Relation Age of Onset  . Osteoporosis Mother   . Dementia Mother   . Osteopenia Mother   . Healthy Father   . Healthy Sister   . Healthy Brother   . Healthy Daughter   . Healthy Son   . Healthy Sister   . Healthy Sister   . Healthy Brother   . Healthy Son   . Uterine cancer Maternal Grandmother     Social History   Socioeconomic History  . Marital status: Divorced    Spouse name: Not on file  . Number of children: Not on file  . Years of education: Not on file  . Highest education level: Not on file  Occupational History  . Not on file  Tobacco Use  . Smoking status: Former Smoker    Packs/day: 0.25    Years: 4.00    Pack years: 1.00    Quit date: 05/02/1996    Years since quitting: 23.7  . Smokeless tobacco: Never Used  Substance and Sexual  Activity  . Alcohol use: Yes    Alcohol/week: 4.0 standard drinks    Types: 4 Glasses of wine per week  . Drug use: No  . Sexual activity: Yes    Partners: Male    Birth control/protection: Surgical  Other Topics Concern  . Not on file  Social History Narrative  . Not on file   Social Determinants of Health   Financial Resource Strain:   . Difficulty of Paying Living Expenses:   Food Insecurity:   . Worried About Charity fundraiser in the Last Year:   . Arboriculturist in the Last Year:   Transportation Needs:   . Film/video editor (Medical):   Marland Kitchen Lack of Transportation (Non-Medical):   Physical Activity:   . Days of Exercise per Week:   . Minutes of Exercise per Session:   Stress:   . Feeling of Stress :   Social Connections:   . Frequency of Communication with Friends and Family:   . Frequency of Social Gatherings with Friends and  Family:   . Attends Religious Services:   . Active Member of Clubs or Organizations:   . Attends Archivist Meetings:   Marland Kitchen Marital Status:   Intimate Partner Violence:   . Fear of Current or Ex-Partner:   . Emotionally Abused:   Marland Kitchen Physically Abused:   . Sexually Abused:     Outpatient Medications Prior to Visit  Medication Sig Dispense Refill  . acetaminophen (TYLENOL) 650 MG CR tablet Take 650 mg by mouth at bedtime as needed for pain.    . Ascorbic Acid (VITAMIN C) 1000 MG tablet Take 1,000 mg by mouth daily.    . Biotin (BIOTIN 5000) 5 MG CAPS Take 5 mg by mouth daily.    . Calcium Carb-Cholecalciferol (CALCIUM 600+D) 600-800 MG-UNIT TABS Take 1 tablet by mouth daily.    . cholecalciferol (VITAMIN D3) 25 MCG (1000 UT) tablet Take 1,000 Units by mouth daily.    . Cyanocobalamin (B-12) 2500 MCG TABS Take 2,500 mcg by mouth daily.    . Magnesium 250 MG TABS Take 250 mg by mouth daily.    . simvastatin (ZOCOR) 10 MG tablet TAKE 1 TABLET BY MOUTH EVERYDAY AT BEDTIME 90 tablet 0  . Turmeric 500 MG CAPS Take 500 mg by mouth daily.    . vitamin E 400 UNIT capsule Take 400 Units by mouth daily.    Merril Abbe 10 MCG TABS vaginal tablet INSERT 1 TABLET TWICE A WEEK INTO VAGINA AS DIRECTED (Patient taking differently: Place 10 mcg vaginally 2 (two) times a week. ) 24 tablet 0  . bisacodyl (BISACODYL) 5 MG EC tablet Take 2 tablets (10mg ) by mouth the day before your procedure between 1pm-3pm. 30 tablet 0  . HYDROcodone-acetaminophen (NORCO/VICODIN) 5-325 MG tablet Take 1-2 tablets every 6 hours as needed for severe pain (Patient not taking: Reported on 09/16/2019) 8 tablet 0  . Psyllium (METAMUCIL PO) Take 1 Dose by mouth daily as needed (constipation).     No facility-administered medications prior to visit.    No Known Allergies  Review of Systems  Review of Systems:  A fourteen system review of systems was performed and found to be positive as per HPI.   Objective:     Physical Exam General:  Well Developed, well nourished, appropriate for stated age.  Neuro:  Alert and oriented,  extra-ocular muscles intact  HEENT:  Normocephalic, atraumatic, neck supple Skin:  no gross rash, warm,  pink. Cardiac:  RRR, S1 S2 Respiratory:  ECTA B/L and A/P, Not using accessory muscles, speaking in full sentences- unlabored. MSK: Good ROM of UE and LE, tenderness to palpation of PIP joints and elbow bilaterally, good strength of UE and LE Vascular:  Ext warm, no cyanosis apprec.; cap RF less 2 sec. Psych:  No HI/SI, judgement and insight good, Euthymic mood. Full Affect.  BP 114/70   Pulse 73   Temp 97.6 F (36.4 C) (Oral)   Ht 5\' 4"  (1.626 m)   Wt 131 lb 14.4 oz (59.8 kg)   SpO2 99%   BMI 22.64 kg/m  Wt Readings from Last 3 Encounters:  01/06/20 131 lb 14.4 oz (59.8 kg)  09/16/19 133 lb 4.8 oz (60.5 kg)  07/22/19 129 lb (58.5 kg)    Health Maintenance Due  Topic Date Due  . COVID-19 Vaccine (1) Never done    There are no preventive care reminders to display for this patient.   Lab Results  Component Value Date   TSH 1.460 09/16/2019   Lab Results  Component Value Date   WBC 6.5 09/16/2019   HGB 13.8 09/16/2019   HCT 42.0 09/16/2019   MCV 95 09/16/2019   PLT 253 09/16/2019   Lab Results  Component Value Date   NA 142 09/16/2019   K 4.5 09/16/2019   CO2 24 09/16/2019   GLUCOSE 106 (H) 09/16/2019   BUN 11 09/16/2019   CREATININE 0.64 09/16/2019   BILITOT 1.2 09/16/2019   ALKPHOS 70 09/16/2019   AST 18 09/16/2019   ALT 17 09/16/2019   PROT 7.3 09/16/2019   ALBUMIN 4.5 09/16/2019   CALCIUM 9.7 09/16/2019   ANIONGAP 11 06/28/2019   Lab Results  Component Value Date   CHOL 213 (H) 09/16/2019   Lab Results  Component Value Date   HDL 93 09/16/2019   Lab Results  Component Value Date   LDLCALC 99 09/16/2019   Lab Results  Component Value Date   TRIG 121 09/16/2019   Lab Results  Component Value Date   CHOLHDL 2.3 09/16/2019    Lab Results  Component Value Date   HGBA1C 5.6 09/16/2019       Assessment & Plan:   Problem List Items Addressed This Visit      Musculoskeletal and Integument   Generalized OA - Primary   Relevant Medications   meloxicam (MOBIC) 15 MG tablet   Other Relevant Orders   Ambulatory referral to Orthopedic Surgery       Meds ordered this encounter  Medications  . meloxicam (MOBIC) 15 MG tablet    Sig: Take 1 tablet (15 mg total) by mouth daily.    Dispense:  30 tablet    Refill:  1    Order Specific Question:   Supervising Provider    Answer:   Beatrice Lecher D [[id number]   Generalized Osteoarthritis: - Pt requested referral to Dr. Suella Broad for further evaluation and management. - Pt had tolerated Meloxicam in the past so will prescribe Meloxicam 15 mg once daily for pain. Per last CMP kidney function normal.  Lorrene Reid, PA-C

## 2020-01-06 NOTE — Patient Instructions (Signed)
Joint Pain Joint pain (arthralgia) may be caused by many things. Joint pain is likely to go away when you follow instructions from your health care provider for relieving pain at home. However, joint pain can also be caused by conditions that require more treatment. Common causes of joint pain include:  Bruising in the area of the joint.  Injury caused by repeating certain movements too many times (overuse injury).  Age-related joint wear and tear (osteoarthritis).  Buildup of uric acid crystals in the joint (gout).  Inflammation of the joint (rheumatic disease).  Various other forms of arthritis.  Infections of the joint (septic arthritis) or of the bone (osteomyelitis). Your health care provider may recommend that you take pain medicine or wear a supportive device like an elastic bandage, sling, or splint. If your joint pain continues, you may need lab or imaging tests to diagnose the cause of your joint pain. Follow these instructions at home: Managing pain, stiffness, and swelling   If directed, put ice on the painful area. Icing can help to relieve joint pain and swelling. ? Put ice in a plastic bag. ? Place a towel between your skin and the bag. ? Leave the ice on for 20 minutes, 2-3 times a day.  If directed, apply heat to the painful area as often as told by your health care provider. Heat can reduce the stiffness of your muscles and joints. Use the heat source that your health care provider recommends, such as a moist heat pack or a heating pad. ? Place a towel between your skin and the heat source. ? Leave the heat on for 20-30 minutes. ? Remove the heat if your skin turns bright red. This is especially important if you are unable to feel pain, heat, or cold. You may have a greater risk of getting burned.  Move your fingers or toes below the painful joint often. You can avoid stiffness and lessen swelling by doing this.  If possible, raise (elevate) the painful joint above  the level of your heart while you are sitting or lying down. To do this, try putting a few pillows under the painful joint. Activity  Rest the painful joint for as long as directed. Do not do anything that causes or worsens pain.  Begin exercising or stretching the affected area, as told by your health care provider. Ask your health care provider what types of exercise are safe for you. If you have an elastic bandage, sling, or splint:  Wear the supportive device as told by your health care provider. Remove it only as told by your health care provider.  Loosen the device if your fingers or toes below the joint tingle, become numb, or turn cold and blue.  Keep the device clean.  Ask your health care provider if you should remove the device before bathing. You may need to cover it with a watertight covering when you take a bath or a shower. General instructions  Take over-the-counter and prescription medicines only as told by your health care provider.  Do not use any products that contain nicotine or tobacco, such as cigarettes and e-cigarettes. If you need help quitting, ask your health care provider.  Keep all follow-up visits as told by your health care provider. This is important. Contact a health care provider if:  You have pain that gets worse and does not get better with medicine.  Your joint pain does not improve within 3 days.  You have increased bruising or swelling.  You have a fever.  You lose 10 lb (4.5 kg) or more without trying. Get help right away if:  You cannot move the joint.  Your fingers or toes tingle, become numb, or turn cold and blue.  You have a fever along with a joint that is red, warm, and swollen. Summary  Joint pain (arthralgia) may be caused by many things.  Your health care provider may recommend that you take pain medicine or wear a supportive device like an elastic bandage, sling, or splint.  If your joint pain continues, you may need  tests to diagnose the cause of your joint pain.  Take over-the-counter and prescription medicines only as told by your health care provider. This information is not intended to replace advice given to you by your health care provider. Make sure you discuss any questions you have with your health care provider. Document Revised: 07/31/2017 Document Reviewed: 06/03/2017 Elsevier Patient Education  2020 [employer]  

## 2020-01-26 DIAGNOSIS — R200 Anesthesia of skin: Secondary | ICD-10-CM | POA: Diagnosis not present

## 2020-01-28 DIAGNOSIS — G5603 Carpal tunnel syndrome, bilateral upper limbs: Secondary | ICD-10-CM | POA: Diagnosis not present

## 2020-02-17 DIAGNOSIS — G5603 Carpal tunnel syndrome, bilateral upper limbs: Secondary | ICD-10-CM | POA: Diagnosis not present

## 2020-02-17 DIAGNOSIS — G5600 Carpal tunnel syndrome, unspecified upper limb: Secondary | ICD-10-CM | POA: Insufficient documentation

## 2020-03-04 ENCOUNTER — Other Ambulatory Visit: Payer: Self-pay | Admitting: Obstetrics and Gynecology

## 2020-03-04 DIAGNOSIS — N951 Menopausal and female climacteric states: Secondary | ICD-10-CM

## 2020-03-04 DIAGNOSIS — N952 Postmenopausal atrophic vaginitis: Secondary | ICD-10-CM

## 2020-03-06 ENCOUNTER — Other Ambulatory Visit: Payer: Self-pay | Admitting: Physician Assistant

## 2020-03-06 ENCOUNTER — Other Ambulatory Visit: Payer: Self-pay | Admitting: Family Medicine

## 2020-03-06 DIAGNOSIS — E782 Mixed hyperlipidemia: Secondary | ICD-10-CM

## 2020-03-06 DIAGNOSIS — M159 Polyosteoarthritis, unspecified: Secondary | ICD-10-CM

## 2020-03-08 ENCOUNTER — Encounter: Payer: Self-pay | Admitting: Physician Assistant

## 2020-03-08 ENCOUNTER — Other Ambulatory Visit: Payer: Self-pay

## 2020-03-08 ENCOUNTER — Ambulatory Visit (INDEPENDENT_AMBULATORY_CARE_PROVIDER_SITE_OTHER): Payer: BC Managed Care – PPO | Admitting: Physician Assistant

## 2020-03-08 VITALS — BP 110/69 | HR 67 | Temp 98.1°F | Ht 64.0 in | Wt 136.8 lb

## 2020-03-08 DIAGNOSIS — Z23 Encounter for immunization: Secondary | ICD-10-CM

## 2020-03-08 DIAGNOSIS — M159 Polyosteoarthritis, unspecified: Secondary | ICD-10-CM

## 2020-03-08 DIAGNOSIS — E782 Mixed hyperlipidemia: Secondary | ICD-10-CM

## 2020-03-08 DIAGNOSIS — Z79899 Other long term (current) drug therapy: Secondary | ICD-10-CM | POA: Diagnosis not present

## 2020-03-08 MED ORDER — SIMVASTATIN 10 MG PO TABS: ORAL_TABLET | ORAL | 1 refills | Status: DC

## 2020-03-08 MED ORDER — MELOXICAM 15 MG PO TABS: 15.0000 mg | ORAL_TABLET | Freq: Every day | ORAL | 1 refills | Status: DC

## 2020-03-08 NOTE — Patient Instructions (Signed)

## 2020-03-08 NOTE — Progress Notes (Signed)
Established Patient Office Visit  Subjective:  Patient ID: Rachael Clark, female    DOB: Dec 28, 1960  Age: 59 y.o. MRN: [id number]  CC:  Chief Complaint  Patient presents with  . Weight Loss    HPI Rachael Clark presents for follow-up on hyperlipidemia, arthritis and second dose of shingles vaccine.  She has no acute concerns today.  HLD: Pt taking medication as directed without issues. Denies side effects. Denies new onset of chest pain, palpitations, shortness of breath, leg pain with activity, or lower extremity swelling. Reports she does monitor what she eats and tries to stay active.  Arthritis: Evaluated by Orthopedics and recommended to continue Meloxicam. Reports they did find she has bilateral carpal tunnel and is going to have a procedure for steroid injections.   Past Medical History:  Diagnosis Date  . Benign breast lumps   . History of kidney stones   . Hyperlipidemia   . Kidney stone   . Osteopenia   . PONV (postoperative nausea and vomiting)     Past Surgical History:  Procedure Laterality Date  . ABDOMINAL HYSTERECTOMY    . ABDOMINOPLASTY    . APPENDECTOMY    . BREAST BIOPSY Bilateral   . BREAST LUMPECTOMY Bilateral   . CESAREAN SECTION     x 1  . CHOLECYSTECTOMY    . COLONOSCOPY WITH PROPOFOL N/A 07/22/2019   Procedure: COLONOSCOPY WITH PROPOFOL;  Surgeon: Virgel Manifold, MD;  Location: [facility];  Service: Endoscopy;  Laterality: N/A;  . INCONTINENCE SURGERY     TVT  . INGUINAL HERNIA REPAIR Right 05/02/2019   Procedure: LAPAROSCOPIC RIGHT INGUINAL HERNIA REPAIR;  Surgeon: Ralene Ok, MD;  Location: [facility];  Service: General;  Laterality: Right;  . INSERTION OF MESH Right 05/02/2019   Procedure: Insertion Of Mesh;  Surgeon: Ralene Ok, MD;  Location: [facility];  Service: General;  Laterality: Right;  . NOSE SURGERY    . OVARIAN CYST REMOVAL    . TONSILLECTOMY    . TOTAL VAGINAL HYSTERECTOMY  2007   still has her  tubes and ovaries  . UMBILICAL HERNIA REPAIR    . WRIST SURGERY Right     Family History  Problem Relation Age of Onset  . Osteoporosis Mother   . Dementia Mother   . Osteopenia Mother   . Healthy Father   . Healthy Sister   . Healthy Brother   . Healthy Daughter   . Healthy Son   . Healthy Sister   . Healthy Sister   . Healthy Brother   . Healthy Son   . Uterine cancer Maternal Grandmother     Social History   Socioeconomic History  . Marital status: Divorced    Spouse name: Not on file  . Number of children: Not on file  . Years of education: Not on file  . Highest education level: Not on file  Occupational History  . Not on file  Tobacco Use  . Smoking status: Former Smoker    Packs/day: 0.25    Years: 4.00    Pack years: 1.00    Quit date: 05/02/1996    Years since quitting: 23.9  . Smokeless tobacco: Never Used  Vaping Use  . Vaping Use: Never used  Substance and Sexual Activity  . Alcohol use: Yes    Alcohol/week: 4.0 standard drinks    Types: 4 Glasses of wine per week  . Drug use: No  . Sexual activity: Yes  Partners: Male    Birth control/protection: Surgical  Other Topics Concern  . Not on file  Social History Narrative  . Not on file   Social Determinants of Health   Financial Resource Strain:   . Difficulty of Paying Living Expenses:   Food Insecurity:   . Worried About Charity fundraiser in the Last Year:   . Arboriculturist in the Last Year:   Transportation Needs:   . Film/video editor (Medical):   Marland Kitchen Lack of Transportation (Non-Medical):   Physical Activity:   . Days of Exercise per Week:   . Minutes of Exercise per Session:   Stress:   . Feeling of Stress :   Social Connections:   . Frequency of Communication with Friends and Family:   . Frequency of Social Gatherings with Friends and Family:   . Attends Religious Services:   . Active Member of Clubs or Organizations:   . Attends Archivist Meetings:   Marland Kitchen  Marital Status:   Intimate Partner Violence:   . Fear of Current or Ex-Partner:   . Emotionally Abused:   Marland Kitchen Physically Abused:   . Sexually Abused:     Outpatient Medications Prior to Visit  Medication Sig Dispense Refill  . acetaminophen (TYLENOL) 650 MG CR tablet Take 650 mg by mouth at bedtime as needed for pain.    . Ascorbic Acid (VITAMIN C) 1000 MG tablet Take 1,000 mg by mouth daily.    . Biotin (BIOTIN 5000) 5 MG CAPS Take 5 mg by mouth daily.    . Calcium Carb-Cholecalciferol (CALCIUM 600+D) 600-800 MG-UNIT TABS Take 1 tablet by mouth daily.    . cholecalciferol (VITAMIN D3) 25 MCG (1000 UT) tablet Take 1,000 Units by mouth daily.    . Cyanocobalamin (B-12) 2500 MCG TABS Take 2,500 mcg by mouth daily.    . Magnesium 250 MG TABS Take 250 mg by mouth daily.    . Turmeric 500 MG CAPS Take 500 mg by mouth daily.    . vitamin E 400 UNIT capsule Take 400 Units by mouth daily.    Merril Abbe 10 MCG TABS vaginal tablet INSERT 1 TABLET TWICE A WEEK INTO VAGINA AS DIRECTED (Patient taking differently: Place 10 mcg vaginally 2 (two) times a week. ) 24 tablet 0  . meloxicam (MOBIC) 15 MG tablet TAKE 1 TABLET BY MOUTH EVERY DAY 30 tablet 0  . simvastatin (ZOCOR) 10 MG tablet TAKE 1 TABLET BY MOUTH EVERYDAY AT BEDTIME 90 tablet 0   No facility-administered medications prior to visit.    No Known Allergies  ROS Review of Systems  A fourteen system review of systems was performed and found to be positive as per HPI.  Objective:    Physical Exam General:  Well Developed, well nourished, appropriate for stated age.  Neuro:  Alert and oriented,  extra-ocular muscles intact, no focal deficits HEENT:  Normocephalic, atraumatic, neck supple, no carotid bruits appreciated  Skin:  no gross rash, warm, pink. Cardiac:  RRR, S1 S2 Respiratory:  ECTA B/L and A/P, Not using accessory muscles, speaking in full sentences- unlabored. Vascular:  Ext warm, no cyanosis apprec.; cap RF less 2 sec. no  gross edema Psych:  No HI/SI, judgement and insight good, Euthymic mood. Full Affect.   BP 110/69   Pulse 67   Temp 98.1 F (36.7 C) (Oral)   Ht 5' 4"  (1.626 m)   Wt 136 lb 12.8 oz (62.1 kg)   SpO2  99% Comment: on RA  BMI 23.48 kg/m  Wt Readings from Last 3 Encounters:  03/08/20 136 lb 12.8 oz (62.1 kg)  01/06/20 131 lb 14.4 oz (59.8 kg)  09/16/19 133 lb 4.8 oz (60.5 kg)     Health Maintenance Due  Topic Date Due  . COVID-19 Vaccine (2 - Pfizer 2-dose series) 02/09/2020    There are no preventive care reminders to display for this patient.  Lab Results  Component Value Date   TSH 1.460 09/16/2019   Lab Results  Component Value Date   WBC 6.5 09/16/2019   HGB 13.8 09/16/2019   HCT 42.0 09/16/2019   MCV 95 09/16/2019   PLT 253 09/16/2019   Lab Results  Component Value Date   NA 142 09/16/2019   K 4.5 09/16/2019   CO2 24 09/16/2019   GLUCOSE 106 (H) 09/16/2019   BUN 11 09/16/2019   CREATININE 0.64 09/16/2019   BILITOT 1.2 09/16/2019   ALKPHOS 70 09/16/2019   AST 18 09/16/2019   ALT 17 09/16/2019   PROT 7.3 09/16/2019   ALBUMIN 4.5 09/16/2019   CALCIUM 9.7 09/16/2019   ANIONGAP 11 06/28/2019   Lab Results  Component Value Date   CHOL 213 (H) 09/16/2019   Lab Results  Component Value Date   HDL 93 09/16/2019   Lab Results  Component Value Date   LDLCALC 99 09/16/2019   Lab Results  Component Value Date   TRIG 121 09/16/2019   Lab Results  Component Value Date   CHOLHDL 2.3 09/16/2019   Lab Results  Component Value Date   HGBA1C 5.6 09/16/2019      Assessment & Plan:   Problem List Items Addressed This Visit      Musculoskeletal and Integument   Generalized OA   Relevant Medications   meloxicam (MOBIC) 15 MG tablet   Other Relevant Orders   Comp Met (CMET)     Other   Hyperlipidemia - Primary (Chronic)   Relevant Medications   simvastatin (ZOCOR) 10 MG tablet   Other Relevant Orders   Comp Met (CMET)   Lipid Profile     Other Visit Diagnoses    Need for shingles vaccine       Relevant Orders   Varicella-zoster vaccine IM (Shingrix) (Completed)   On statin therapy       Relevant Orders   Comp Met (CMET)     HLD: - Last lipid panel wnl's with the exception of total cholesterol elevated at 213 - Continue simvastatin 10 mg - Continue heart healthy diet and stay as active as possible.  - Placed future lab order to repeat lipid panel and hepatic function and advised to return within the next few weeks for blood work.  Generalized OA: -Followed by orthopedics. -Continue meloxicam 15 mg as needed for pain. Provided refill. -Recommend to use topical anti-inflammatory such as Voltaren gel as needed. -Last CMP: Kidney function normal -Place future lab order for CMP to monitor kidney function.  Meds ordered this encounter  Medications  . meloxicam (MOBIC) 15 MG tablet    Sig: Take 1 tablet (15 mg total) by mouth daily.    Dispense:  90 tablet    Refill:  1  . simvastatin (ZOCOR) 10 MG tablet    Sig: TAKE 1 TABLET BY MOUTH EVERYDAY AT BEDTIME    Dispense:  90 tablet    Refill:  1    Follow-up: Return for CPE and FBW in 09/2018; lab visit for FBW (lipid panel,  cmp) in 3-4 weeks.    Lorrene Reid, PA-C

## 2020-03-15 ENCOUNTER — Ambulatory Visit: Payer: BC Managed Care – PPO | Admitting: Physician Assistant

## 2020-03-22 DIAGNOSIS — G5603 Carpal tunnel syndrome, bilateral upper limbs: Secondary | ICD-10-CM | POA: Diagnosis not present

## 2020-04-05 ENCOUNTER — Other Ambulatory Visit: Payer: BC Managed Care – PPO

## 2020-04-06 ENCOUNTER — Other Ambulatory Visit: Payer: BC Managed Care – PPO

## 2020-04-06 ENCOUNTER — Other Ambulatory Visit: Payer: Self-pay

## 2020-04-06 DIAGNOSIS — Z79899 Other long term (current) drug therapy: Secondary | ICD-10-CM

## 2020-04-06 DIAGNOSIS — M159 Polyosteoarthritis, unspecified: Secondary | ICD-10-CM | POA: Diagnosis not present

## 2020-04-06 DIAGNOSIS — E782 Mixed hyperlipidemia: Secondary | ICD-10-CM

## 2020-04-07 LAB — COMPREHENSIVE METABOLIC PANEL
ALT: 28 IU/L (ref 0–32)
AST: 24 IU/L (ref 0–40)
Albumin/Globulin Ratio: 2 (ref 1.2–2.2)
Albumin: 4.4 g/dL (ref 3.8–4.9)
Alkaline Phosphatase: 67 IU/L (ref 48–121)
BUN/Creatinine Ratio: 28 — ABNORMAL HIGH (ref 9–23)
BUN: 19 mg/dL (ref 6–24)
Bilirubin Total: 0.8 mg/dL (ref 0.0–1.2)
CO2: 24 mmol/L (ref 20–29)
Calcium: 9.5 mg/dL (ref 8.7–10.2)
Chloride: 102 mmol/L (ref 96–106)
Creatinine, Ser: 0.67 mg/dL (ref 0.57–1.00)
GFR calc Af Amer: 111 mL/min/{1.73_m2} (ref >59)
GFR calc non Af Amer: 97 mL/min/{1.73_m2} (ref >59)
Globulin, Total: 2.2 g/dL (ref 1.5–4.5)
Glucose: 92 mg/dL (ref 65–99)
Potassium: 4.4 mmol/L (ref 3.5–5.2)
Sodium: 138 mmol/L (ref 134–144)
Total Protein: 6.6 g/dL (ref 6.0–8.5)

## 2020-04-07 LAB — LIPID PANEL
Chol/HDL Ratio: 2.6 ratio (ref 0.0–4.4)
Cholesterol, Total: 244 mg/dL — ABNORMAL HIGH (ref 100–199)
HDL: 94 mg/dL (ref >39)
LDL Chol Calc (NIH): 134 mg/dL — ABNORMAL HIGH (ref 0–99)
Triglycerides: 93 mg/dL (ref 0–149)
VLDL Cholesterol Cal: 16 mg/dL (ref 5–40)

## 2020-07-20 ENCOUNTER — Ambulatory Visit (INDEPENDENT_AMBULATORY_CARE_PROVIDER_SITE_OTHER): Payer: BC Managed Care – PPO

## 2020-07-20 ENCOUNTER — Other Ambulatory Visit: Payer: Self-pay

## 2020-07-20 DIAGNOSIS — Z23 Encounter for immunization: Secondary | ICD-10-CM

## 2020-07-20 NOTE — Progress Notes (Signed)
Pt here for influenza vaccine.  Screening questionnaire reviewed, VIS provided to patient, and any/all patient questions answered.  T. Yigit Norkus, CMA  

## 2020-09-02 ENCOUNTER — Other Ambulatory Visit: Payer: Self-pay | Admitting: Physician Assistant

## 2020-09-02 DIAGNOSIS — E782 Mixed hyperlipidemia: Secondary | ICD-10-CM

## 2020-09-02 DIAGNOSIS — M159 Polyosteoarthritis, unspecified: Secondary | ICD-10-CM

## 2020-09-17 ENCOUNTER — Encounter: Payer: BC Managed Care – PPO | Admitting: Physician Assistant

## 2020-09-27 ENCOUNTER — Encounter: Payer: Self-pay | Admitting: Physician Assistant

## 2020-09-27 ENCOUNTER — Other Ambulatory Visit: Payer: Self-pay

## 2020-09-27 ENCOUNTER — Ambulatory Visit (INDEPENDENT_AMBULATORY_CARE_PROVIDER_SITE_OTHER): Payer: BC Managed Care – PPO | Admitting: Physician Assistant

## 2020-09-27 VITALS — BP 96/64 | HR 80 | Ht 64.0 in | Wt 134.5 lb

## 2020-09-27 DIAGNOSIS — Z0000 Encounter for general adult medical examination without abnormal findings: Secondary | ICD-10-CM

## 2020-09-27 DIAGNOSIS — E782 Mixed hyperlipidemia: Secondary | ICD-10-CM

## 2020-09-27 DIAGNOSIS — Z1231 Encounter for screening mammogram for malignant neoplasm of breast: Secondary | ICD-10-CM

## 2020-09-27 DIAGNOSIS — M159 Polyosteoarthritis, unspecified: Secondary | ICD-10-CM

## 2020-09-27 DIAGNOSIS — R319 Hematuria, unspecified: Secondary | ICD-10-CM

## 2020-09-27 NOTE — Patient Instructions (Signed)
Heart-Healthy Eating Plan Heart-healthy meal planning includes:  Eating less unhealthy fats.  Eating more healthy fats.  Making other changes in your diet. Talk with your doctor or a diet specialist (dietitian) to create an eating plan that is right for you. What is my plan? Your doctor may recommend an eating plan that includes:  Total fat: ______% or less of total calories a day.  Saturated fat: ______% or less of total calories a day.  Cholesterol: less than _________mg a day. What are tips for following this plan? Cooking Avoid frying your food. Try to bake, boil, grill, or broil it instead. You can also reduce fat by:  Removing the skin from poultry.  Removing all visible fats from meats.  Steaming vegetables in water or broth. Meal planning  At meals, divide your plate into four equal parts: ? Fill one-half of your plate with vegetables and green salads. ? Fill one-fourth of your plate with whole grains. ? Fill one-fourth of your plate with lean protein foods.  Eat 4-5 servings of vegetables per day. A serving of vegetables is: ? 1 cup of raw or cooked vegetables. ? 2 cups of raw leafy greens.  Eat 4-5 servings of fruit per day. A serving of fruit is: ? 1 medium whole fruit. ?  cup of dried fruit. ?  cup of fresh, frozen, or canned fruit. ?  cup of 100% fruit juice.  Eat more foods that have soluble fiber. These are apples, broccoli, carrots, beans, peas, and barley. Try to get 20-30 g of fiber per day.  Eat 4-5 servings of nuts, legumes, and seeds per week: ? 1 serving of dried beans or legumes equals  cup after being cooked. ? 1 serving of nuts is  cup. ? 1 serving of seeds equals 1 tablespoon.   General information  Eat more home-cooked food. Eat less restaurant, buffet, and fast food.  Limit or avoid alcohol.  Limit foods that are high in starch and sugar.  Avoid fried foods.  Lose weight if you are overweight.  Keep track of how much salt  (sodium) you eat. This is important if you have high blood pressure. Ask your doctor to tell you more about this.  Try to add vegetarian meals each week. Fats  Choose healthy fats. These include olive oil and canola oil, flaxseeds, walnuts, almonds, and seeds.  Eat more omega-3 fats. These include salmon, mackerel, sardines, tuna, flaxseed oil, and ground flaxseeds. Try to eat fish at least 2 times each week.  Check food labels. Avoid foods with trans fats or high amounts of saturated fat.  Limit saturated fats. ? These are often found in animal products, such as meats, butter, and cream. ? These are also found in plant foods, such as palm oil, palm kernel oil, and coconut oil.  Avoid foods with partially hydrogenated oils in them. These have trans fats. Examples are stick margarine, some tub margarines, cookies, crackers, and other baked goods. What foods can I eat? Fruits All fresh, canned (in natural juice), or frozen fruits. Vegetables Fresh or frozen vegetables (raw, steamed, roasted, or grilled). Green salads. Grains Most grains. Choose whole wheat and whole grains most of the time. Rice and pasta, including brown rice and pastas made with whole wheat. Meats and other proteins Lean, well-trimmed beef, veal, pork, and lamb. Chicken and turkey without skin. All fish and shellfish. Wild duck, rabbit, pheasant, and venison. Egg whites or low-cholesterol egg substitutes. Dried beans, peas, lentils, and tofu. Seeds and   most nuts. Dairy Low-fat or nonfat cheeses, including ricotta and mozzarella. Skim or 1% milk that is liquid, powdered, or evaporated. Buttermilk that is made with low-fat milk. Nonfat or low-fat yogurt. Fats and oils Non-hydrogenated (trans-free) margarines. Vegetable oils, including soybean, sesame, sunflower, olive, peanut, safflower, corn, canola, and cottonseed. Salad dressings or mayonnaise made with a vegetable oil. Beverages Mineral water. Coffee and tea. Diet  carbonated beverages. Sweets and desserts Sherbet, gelatin, and fruit ice. Small amounts of dark chocolate. Limit all sweets and desserts. Seasonings and condiments All seasonings and condiments. The items listed above may not be a complete list of foods and drinks you can eat. Contact a dietitian for more options. What foods should I avoid? Fruits Canned fruit in heavy syrup. Fruit in cream or butter sauce. Fried fruit. Limit coconut. Vegetables Vegetables cooked in cheese, cream, or butter sauce. Fried vegetables. Grains Breads that are made with saturated or trans fats, oils, or whole milk. Croissants. Sweet rolls. Donuts. High-fat crackers, such as cheese crackers. Meats and other proteins Fatty meats, such as hot dogs, ribs, sausage, bacon, rib-eye roast or steak. High-fat deli meats, such as salami and bologna. Caviar. Domestic duck and goose. Organ meats, such as liver. Dairy Cream, sour cream, cream cheese, and creamed cottage cheese. Whole-milk cheeses. Whole or 2% milk that is liquid, evaporated, or condensed. Whole buttermilk. Cream sauce or high-fat cheese sauce. Yogurt that is made from whole milk. Fats and oils Meat fat, or shortening. Cocoa butter, hydrogenated oils, palm oil, coconut oil, palm kernel oil. Solid fats and shortenings, including bacon fat, salt pork, lard, and butter. Nondairy cream substitutes. Salad dressings with cheese or sour cream. Beverages Regular sodas and juice drinks with added sugar. Sweets and desserts Frosting. Pudding. Cookies. Cakes. Pies. Milk chocolate or white chocolate. Buttered syrups. Full-fat ice cream or ice cream drinks. The items listed above may not be a complete list of foods and drinks to avoid. Contact a dietitian for more information. Summary  Heart-healthy meal planning includes eating less unhealthy fats, eating more healthy fats, and making other changes in your diet.  Eat a balanced diet. This includes fruits and  vegetables, low-fat or nonfat dairy, lean protein, nuts and legumes, whole grains, and heart-healthy oils and fats. This information is not intended to replace advice given to you by your health care provider. Make sure you discuss any questions you have with your health care provider. Document Revised: 10/22/2017 Document Reviewed: 09/25/2017 Elsevier Patient Education  2021 Elsevier Inc.    Preventive Care 74-21 Years Old, Female Preventive care refers to lifestyle choices and visits with your health care provider that can promote health and wellness. This includes:  A yearly physical exam. This is also called an annual wellness visit.  Regular dental and eye exams.  Immunizations.  Screening for certain conditions.  Healthy lifestyle choices, such as: ? Eating a healthy diet. ? Getting regular exercise. ? Not using drugs or products that contain nicotine and tobacco. ? Limiting alcohol use. What can I expect for my preventive care visit? Physical exam Your health care provider will check your:  Height and weight. These may be used to calculate your BMI (body mass index). BMI is a measurement that tells if you are at a healthy weight.  Heart rate and blood pressure.  Body temperature.  Skin for abnormal spots. Counseling Your health care provider may ask you questions about your:  Past medical problems.  Family's medical history.  Alcohol, tobacco, and drug use.  Emotional well-being.  Home life and relationship well-being.  Sexual activity.  Diet, exercise, and sleep habits.  Work and work Statistician.  Access to firearms.  Method of birth control.  Menstrual cycle.  Pregnancy history. What immunizations do I need? Vaccines are usually given at various ages, according to a schedule. Your health care provider will recommend vaccines for you based on your age, medical history, and lifestyle or other factors, such as travel or where you work.   What  tests do I need? Blood tests  Lipid and cholesterol levels. These may be checked every 5 years, or more often if you are over 66 years old.  Hepatitis C test.  Hepatitis B test. Screening  Lung cancer screening. You may have this screening every year starting at age 71 if you have a 30-pack-year history of smoking and currently smoke or have quit within the past 15 years.  Colorectal cancer screening. ? All adults should have this screening starting at age 41 and continuing until age 57. ? Your health care provider may recommend screening at age 99 if you are at increased risk. ? You will have tests every 1-10 years, depending on your results and the type of screening test.  Diabetes screening. ? This is done by checking your blood sugar (glucose) after you have not eaten for a while (fasting). ? You may have this done every 1-3 years.  Mammogram. ? This may be done every 1-2 years. ? Talk with your health care provider about when you should start having regular mammograms. This may depend on whether you have a family history of breast cancer.  BRCA-related cancer screening. This may be done if you have a family history of breast, ovarian, tubal, or peritoneal cancers.  Pelvic exam and Pap test. ? This may be done every 3 years starting at age 8. ? Starting at age 22, this may be done every 5 years if you have a Pap test in combination with an HPV test. Other tests  STD (sexually transmitted disease) testing, if you are at risk.  Bone density scan. This is done to screen for osteoporosis. You may have this scan if you are at high risk for osteoporosis. Talk with your health care provider about your test results, treatment options, and if necessary, the need for more tests. Follow these instructions at home: Eating and drinking  Eat a diet that includes fresh fruits and vegetables, whole grains, lean protein, and low-fat dairy products.  Take vitamin and mineral supplements  as recommended by your health care provider.  Do not drink alcohol if: ? Your health care provider tells you not to drink. ? You are pregnant, may be pregnant, or are planning to become pregnant.  If you drink alcohol: ? Limit how much you have to 0-1 drink a day. ? Be aware of how much alcohol is in your drink. In the [place] one drink equals one 12 oz bottle of beer (355 mL), one 5 oz glass of wine (148 mL), or one 1 oz glass of hard liquor (44 mL).   Lifestyle  Take daily care of your teeth and gums. Brush your teeth every morning and night with fluoride toothpaste. Floss one time each day.  Stay active. Exercise for at least 30 minutes 5 or more days each week.  Do not use any products that contain nicotine or tobacco, such as cigarettes, e-cigarettes, and chewing tobacco. If you need help quitting, ask your health care provider.  Do not  use drugs.  If you are sexually active, practice safe sex. Use a condom or other form of protection to prevent STIs (sexually transmitted infections).  If you do not wish to become pregnant, use a form of birth control. If you plan to become pregnant, see your health care provider for a prepregnancy visit.  If told by your health care provider, take low-dose aspirin daily starting at age 3.  Find healthy ways to cope with stress, such as: ? Meditation, yoga, or listening to music. ? Journaling. ? Talking to a trusted person. ? Spending time with friends and family. Safety  Always wear your seat belt while driving or riding in a vehicle.  Do not drive: ? If you have been drinking alcohol. Do not ride with someone who has been drinking. ? When you are tired or distracted. ? While texting.  Wear a helmet and other protective equipment during sports activities.  If you have firearms in your house, make sure you follow all gun safety procedures. What's next?  Visit your health care provider once a year for an annual wellness visit.  Ask  your health care provider how often you should have your eyes and teeth checked.  Stay up to date on all vaccines. This information is not intended to replace advice given to you by your health care provider. Make sure you discuss any questions you have with your health care provider. Document Revised: 05/22/2020 Document Reviewed: 04/29/2018 Elsevier Patient Education  2021 [employer].

## 2020-09-27 NOTE — Progress Notes (Signed)
Female Physical   Impression and Recommendations:    1. Healthcare maintenance   2. Mixed hyperlipidemia   3. Generalized OA   4. Encounter for screening mammogram for malignant neoplasm of breast   5. Hematuria, unspecified type      1) Anticipatory Guidance: Skin CA prevention- recommend to use sunscreen when outside along with skin surveillance; eating a balanced and modest diet; physical activity at least 25 minutes per day or minimum of 150 min/ week moderate to intense activity.  2) Immunizations / Screenings / Labs:   All immunizations are up-to-date per recommendations or will be updated today if pt allows.    - Patient understands with dental and vision screens they will schedule independently.  - Will obtain CBC, CMP, HgA1c, Lipid panel, and TSH when fasting. - Will place order for mammogram. - UTD on colonoscopy, Tdap, Shingrix, Influenza, Hep C and HIV screenings. Completed Covid vaccine series.  3) Healthcare Maintenance:  -Continue current medication regimen. -Will place Urology referral. Pt has bladder mesh and has noticed hematuria. -Recommend to wear compression socks and follow a low sodium diet for LE edema. -Schedule lab visit for FBW. -Follow up in 6 months for HLD, monitor renal function with Meloxicam.  No orders of the defined types were placed in this encounter.   Orders Placed This Encounter  Procedures  . MM Digital Screening  . CBC  . Comprehensive metabolic panel  . TSH  . Lipid panel  . Hemoglobin A1c  . Ambulatory referral to Urology     Return in about 6 months (around 03/27/2021) for HLD.     Gross side effects, risk and benefits, and alternatives of medications discussed with patient.  Patient is aware that all medications have potential side effects and we are unable to predict every side effect or drug-drug interaction that may occur.  Expresses verbal understanding and consents to current therapy plan and treatment  regimen.  F-up preventative CPE in 1 year-this is in addition to any chronic care visits.    Please see orders placed and AVS handed out to patient at the end of our visit for further patient instructions/ counseling done pertaining to today's office visit.   Note:  This note was prepared with assistance of Dragon voice recognition software. Occasional wrong-word or sound-a-like substitutions may have occurred due to the inherent limitations of voice recognition software.     Subjective:     CPE HPI: Rachael Clark is a 60 y.o. female who presents to [place] [facility] today a yearly health maintenance exam.   Health Maintenance Summary  - Reviewed and updated, unless pt declines services.  Last Cologuard or Colonoscopy:    07/22/19- repeat in 5 years Tobacco History Reviewed:  Y, former smoker w/ 1 Pck yr hx Alcohol and/or drug use:    No concerns; no use Dental Home:Y Eye exams:Y Dermatology home:N  Female Health:  PAP Smear - last known results:  Total hysterectomy  STD concerns:   none, monogamous Birth control method: n/a Menses regular: n/a Lumps or breast concerns: none Breast Cancer Family History:  No   Additional concerns beyond health maintenance issues: none    Immunization History  Administered Date(s) Administered  . Influenza,inj,Quad PF,6+ Mos 07/08/2017, 09/15/2018, 06/10/2019, 07/20/2020  . PFIZER(Purple Top)SARS-COV-2 Vaccination 01/19/2020, 02/09/2020  . Tdap 07/08/2017  . Zoster Recombinat (Shingrix) 09/16/2019, 03/08/2020     Health Maintenance  Topic Date Due  . COVID-19 Vaccine (3 -  Pfizer risk 4-dose series) 03/08/2020  . MAMMOGRAM  08/15/2021  . DEXA SCAN  08/15/2021  . COLONOSCOPY (Pts 45-36yrs Insurance coverage will need to be confirmed)  07/21/2024  . TETANUS/TDAP  07/09/2027  . INFLUENZA VACCINE  Completed  . Hepatitis C Screening  Completed  . HIV Screening  Completed  . PAP SMEAR-Modifier   Discontinued     Wt Readings from Last 3 Encounters:  09/27/20 134 lb 8 oz (61 kg)  03/08/20 136 lb 12.8 oz (62.1 kg)  01/06/20 131 lb 14.4 oz (59.8 kg)   BP Readings from Last 3 Encounters:  09/27/20 96/64  03/08/20 110/69  01/06/20 114/70   Pulse Readings from Last 3 Encounters:  09/27/20 80  03/08/20 67  01/06/20 73     Past Medical History:  Diagnosis Date  . Benign breast lumps   . History of kidney stones   . Hyperlipidemia   . Kidney stone   . Osteopenia   . PONV (postoperative nausea and vomiting)       Past Surgical History:  Procedure Laterality Date  . ABDOMINAL HYSTERECTOMY    . ABDOMINOPLASTY    . APPENDECTOMY    . BREAST BIOPSY Bilateral   . BREAST LUMPECTOMY Bilateral   . CESAREAN SECTION     x 1  . CHOLECYSTECTOMY    . COLONOSCOPY WITH PROPOFOL N/A 07/22/2019   Procedure: COLONOSCOPY WITH PROPOFOL;  Surgeon: Virgel Manifold, MD;  Location: [facility] ENDOSCOPY;  Service: Endoscopy;  Laterality: N/A;  . INCONTINENCE SURGERY     TVT  . INGUINAL HERNIA REPAIR Right 05/02/2019   Procedure: LAPAROSCOPIC RIGHT INGUINAL HERNIA REPAIR;  Surgeon: Ralene Ok, MD;  Location: [place];  Service: General;  Laterality: Right;  . INSERTION OF MESH Right 05/02/2019   Procedure: Insertion Of Mesh;  Surgeon: Ralene Ok, MD;  Location: [place];  Service: General;  Laterality: Right;  . NOSE SURGERY    . OVARIAN CYST REMOVAL    . TONSILLECTOMY    . TOTAL VAGINAL HYSTERECTOMY  2007   still has her tubes and ovaries  . UMBILICAL HERNIA REPAIR    . WRIST SURGERY Right       Family History  Problem Relation Age of Onset  . Osteoporosis Mother   . Dementia Mother   . Osteopenia Mother   . Healthy Father   . Healthy Sister   . Healthy Brother   . Healthy Daughter   . Healthy Son   . Healthy Sister   . Healthy Sister   . Healthy Brother   . Healthy Son   . Uterine cancer Maternal Grandmother       Social History   Substance and Sexual  Activity  Drug Use No  ,   Social History   Substance and Sexual Activity  Alcohol Use Yes  . Alcohol/week: 4.0 standard drinks  . Types: 4 Glasses of wine per week  ,   Social History   Tobacco Use  Smoking Status Former Smoker  . Packs/day: 0.25  . Years: 4.00  . Pack years: 1.00  . Quit date: 05/02/1996  . Years since quitting: 24.4  Smokeless Tobacco Never Used  ,   Social History   Substance and Sexual Activity  Sexual Activity Yes  . Partners: Male  . Birth control/protection: Surgical    Current Outpatient Medications on File Prior to Visit  Medication Sig Dispense Refill  . acetaminophen (TYLENOL) 650 MG CR tablet Take 650 mg by mouth at bedtime as  needed for pain.    . Ascorbic Acid (VITAMIN C) 1000 MG tablet Take 1,000 mg by mouth daily.    . Biotin 5 MG CAPS Take 5 mg by mouth daily.    . Calcium Carb-Cholecalciferol (CALCIUM 600+D) 600-800 MG-UNIT TABS Take 1 tablet by mouth daily.    . cholecalciferol (VITAMIN D3) 25 MCG (1000 UT) tablet Take 1,000 Units by mouth daily.    . Cyanocobalamin (B-12) 2500 MCG TABS Take 2,500 mcg by mouth daily.    . Magnesium 250 MG TABS Take 250 mg by mouth daily.    . meloxicam (MOBIC) 15 MG tablet TAKE 1 TABLET BY MOUTH EVERY DAY 90 tablet 0  . simvastatin (ZOCOR) 10 MG tablet TAKE 1 TABLET BY MOUTH EVERYDAY AT BEDTIME 90 tablet 0  . Turmeric 500 MG CAPS Take 500 mg by mouth daily.    . vitamin E 400 UNIT capsule Take 400 Units by mouth daily.    Merril Abbe 10 MCG TABS vaginal tablet INSERT 1 TABLET TWICE A WEEK INTO VAGINA AS DIRECTED (Patient taking differently: Place 10 mcg vaginally 2 (two) times a week.) 24 tablet 0   No current facility-administered medications on file prior to visit.    Allergies: Patient has no known allergies.  Review of Systems: General:   Denies fever, chills, unexplained weight loss.  Optho/Auditory:   Denies visual changes, blurred vision/LOV Respiratory:   Denies SOB, DOE more than  baseline levels.   Cardiovascular:   Denies chest pain, palpitations, new onset peripheral edema  Gastrointestinal:   Denies nausea, vomiting, diarrhea.  Genitourinary: Denies dysuria, freq/ urgency, flank pain or discharge, +hematuria Endocrine:     Denies hot or cold intolerance, polyuria, polydipsia. Musculoskeletal:   Denies unexplained myalgias, joint swelling, unexplained arthralgias, gait problems.  Skin:  Denies rash, suspicious lesions Neurological:     Denies dizziness, unexplained weakness, numbness  Psychiatric/Behavioral:   Denies mood changes, suicidal or homicidal ideations, hallucinations    Objective:    Blood pressure 96/64, pulse 80, height 5\' 4"  (1.626 m), weight 134 lb 8 oz (61 kg), SpO2 98 %. Body mass index is 23.09 kg/m. General Appearance:    Alert, cooperative, no distress, appears stated age  Head:    Normocephalic, without obvious abnormality, atraumatic  Eyes:    PERRL, conjunctiva/corneas clear, EOM's intact, both eyes  Ears:    Normal TM's and external ear canals, both ears  Nose:   Nares normal, septum midline, mucosa normal, no drainage    or sinus tenderness  Throat:   Lips w/o lesion, mucosa moist, and tongue normal; teeth and   gums normal  Neck:   Supple, symmetrical, trachea midline, no adenopathy;    thyroid:  no enlargement/tenderness/nodules; no JVD  Back:     Symmetric, no curvature, ROM normal, no CVA tenderness  Lungs:     Clear to auscultation bilaterally, respirations unlabored, no       Wh/ R/ R  Chest Wall:    No tenderness or gross deformity; normal excursion   Heart:    Regular rate and rhythm, S1 and S2 normal, no murmur, rub   or gallop  Breast Exam:    No tenderness, masses, or nipple abnormality b/l; no d/c  Abdomen:     Soft, non-tender, bowel sounds active all four quadrants, No   G/R/R, no masses, no organomegaly, surgical scars noted  Genitalia:    Deferred. Total hysterectomy, no concerns/sxs.  Rectal:    Deferred.   Extremities:  Extremities normal, atraumatic, no cyanosis, trace edema  Pulses:   2+ and symmetric all extremities  Skin:   Warm, dry, Skin color, texture, turgor normal, no obvious rashes or lesions Psych: No HI/SI, judgement and insight good, Euthymic mood. Full Affect.  Neurologic:   CNII-XII grossly intact, normal strength, sensation and reflexes throughout

## 2020-10-09 ENCOUNTER — Telehealth: Payer: Self-pay | Admitting: Physician Assistant

## 2020-10-09 NOTE — Telephone Encounter (Signed)
patient had a question- was there a lab order in her chart- i advised there is not, only the 6 month follow up per her 1/22 visit.

## 2020-10-24 ENCOUNTER — Other Ambulatory Visit: Payer: Self-pay | Admitting: Physician Assistant

## 2020-10-24 DIAGNOSIS — E782 Mixed hyperlipidemia: Secondary | ICD-10-CM

## 2020-10-24 DIAGNOSIS — M159 Polyosteoarthritis, unspecified: Secondary | ICD-10-CM

## 2020-11-16 DIAGNOSIS — H5203 Hypermetropia, bilateral: Secondary | ICD-10-CM | POA: Diagnosis not present

## 2020-11-16 DIAGNOSIS — D3131 Benign neoplasm of right choroid: Secondary | ICD-10-CM | POA: Diagnosis not present

## 2020-11-16 DIAGNOSIS — H2513 Age-related nuclear cataract, bilateral: Secondary | ICD-10-CM | POA: Diagnosis not present

## 2021-02-06 ENCOUNTER — Other Ambulatory Visit: Payer: Self-pay | Admitting: Physician Assistant

## 2021-02-06 DIAGNOSIS — E782 Mixed hyperlipidemia: Secondary | ICD-10-CM

## 2021-02-06 DIAGNOSIS — M159 Polyosteoarthritis, unspecified: Secondary | ICD-10-CM

## 2021-03-21 ENCOUNTER — Ambulatory Visit
Admission: RE | Admit: 2021-03-21 | Discharge: 2021-03-21 | Disposition: A | Discharge status: Home / Self Care | Payer: BC Managed Care – PPO | Source: Ambulatory Visit | Attending: Physician Assistant | Admitting: Physician Assistant

## 2021-03-21 ENCOUNTER — Other Ambulatory Visit: Payer: Self-pay

## 2021-03-21 DIAGNOSIS — Z1231 Encounter for screening mammogram for malignant neoplasm of breast: Secondary | ICD-10-CM

## 2021-03-27 ENCOUNTER — Encounter: Payer: Self-pay | Admitting: Physician Assistant

## 2021-03-27 ENCOUNTER — Ambulatory Visit (INDEPENDENT_AMBULATORY_CARE_PROVIDER_SITE_OTHER): Payer: BC Managed Care – PPO | Admitting: Physician Assistant

## 2021-03-27 ENCOUNTER — Other Ambulatory Visit: Payer: Self-pay

## 2021-03-27 VITALS — BP 124/74 | HR 68 | Temp 97.8°F | Ht 64.0 in | Wt 133.3 lb

## 2021-03-27 DIAGNOSIS — R42 Dizziness and giddiness: Secondary | ICD-10-CM

## 2021-03-27 DIAGNOSIS — M8589 Other specified disorders of bone density and structure, multiple sites: Secondary | ICD-10-CM | POA: Diagnosis not present

## 2021-03-27 DIAGNOSIS — E782 Mixed hyperlipidemia: Secondary | ICD-10-CM | POA: Diagnosis not present

## 2021-03-27 DIAGNOSIS — Z87898 Personal history of other specified conditions: Secondary | ICD-10-CM | POA: Diagnosis not present

## 2021-03-27 DIAGNOSIS — M159 Polyosteoarthritis, unspecified: Secondary | ICD-10-CM | POA: Diagnosis not present

## 2021-03-27 DIAGNOSIS — Z0000 Encounter for general adult medical examination without abnormal findings: Secondary | ICD-10-CM

## 2021-03-27 MED ORDER — MECLIZINE HCL 12.5 MG PO TABS: 12.5000 mg | ORAL_TABLET | Freq: Three times a day (TID) | ORAL | 0 refills | Status: DC | PRN

## 2021-03-27 NOTE — Progress Notes (Signed)
Established Patient Office Visit  Subjective:  Patient ID: Rachael Clark, female    DOB: Sep 02, 1960  Age: 60 y.o. MRN: [id number]  CC:  Chief Complaint  Patient presents with   Follow-up   Hyperlipidemia   Dizziness    HPI [name] presents for follow up on hyperlipidemia. Patient has c/o dizziness, reports 2 episodes with one occurring when waking up. States feels like there is pressure in her head. Denies chest pain, palpitations, syncope, slurred speech, hearing loss, or altered mental status. States is staying hydrated.  HLD: Pt taking medication as directed without issues. Denies worsening arthralgia from baseline. Continues to stay active.    Past Medical History:  Diagnosis Date   Benign breast lumps    History of kidney stones    Hyperlipidemia    Kidney stone    Osteopenia    PONV (postoperative nausea and vomiting)     Past Surgical History:  Procedure Laterality Date   ABDOMINAL HYSTERECTOMY     ABDOMINOPLASTY     APPENDECTOMY     BREAST BIOPSY Bilateral    BREAST LUMPECTOMY Bilateral    CESAREAN SECTION     x 1   CHOLECYSTECTOMY     COLONOSCOPY WITH PROPOFOL N/A 07/22/2019   Procedure: COLONOSCOPY WITH PROPOFOL;  Surgeon: Virgel Manifold, MD;  Location: [facility];  Service: Endoscopy;  Laterality: N/A;   INCONTINENCE SURGERY     TVT   INGUINAL HERNIA REPAIR Right 05/02/2019   Procedure: LAPAROSCOPIC RIGHT INGUINAL HERNIA REPAIR;  Surgeon: Ralene Ok, MD;  Location: [place];  Service: General;  Laterality: Right;   INSERTION OF MESH Right 05/02/2019   Procedure: Insertion Of Mesh;  Surgeon: Ralene Ok, MD;  Location: [place];  Service: General;  Laterality: Right;   NOSE SURGERY     OVARIAN CYST REMOVAL     TONSILLECTOMY     TOTAL VAGINAL HYSTERECTOMY  2007   still has her tubes and ovaries   UMBILICAL HERNIA REPAIR     WRIST SURGERY Right     Family History  Problem Relation Age of Onset   Osteoporosis  Mother    Dementia Mother    Osteopenia Mother    Healthy Father    Healthy Sister    Healthy Brother    Healthy Daughter    Healthy Son    Healthy Sister    Healthy Sister    Healthy Brother    Healthy Son    Uterine cancer Maternal Grandmother     Social History   Socioeconomic History   Marital status: Divorced    Spouse name: Not on file   Number of children: Not on file   Years of education: Not on file   Highest education level: Not on file  Occupational History   Not on file  Tobacco Use   Smoking status: Former    Packs/day: 0.25    Years: 4.00    Pack years: 1.00    Types: Cigarettes    Quit date: 05/02/1996    Years since quitting: 24.9   Smokeless tobacco: Never  Vaping Use   Vaping Use: Never used  Substance and Sexual Activity   Alcohol use: Yes    Alcohol/week: 4.0 standard drinks    Types: 4 Glasses of wine per week   Drug use: No   Sexual activity: Yes    Partners: Male    Birth control/protection: Surgical  Other Topics Concern   Not on  file  Social History Narrative   Not on file   Social Determinants of Health   Financial Resource Strain: Not on file  Food Insecurity: Not on file  Transportation Needs: Not on file  Physical Activity: Not on file  Stress: Not on file  Social Connections: Not on file  Intimate Partner Violence: Not on file    Outpatient Medications Prior to Visit  Medication Sig Dispense Refill   Ascorbic Acid (VITAMIN C) 1000 MG tablet Take 1,000 mg by mouth daily.     Biotin 5 MG CAPS Take 5 mg by mouth daily.     Calcium Carb-Cholecalciferol (CALCIUM 600+D) 600-800 MG-UNIT TABS Take 1 tablet by mouth daily.     cholecalciferol (VITAMIN D3) 25 MCG (1000 UT) tablet Take 1,000 Units by mouth daily.     Cyanocobalamin (B-12) 2500 MCG TABS Take 2,500 mcg by mouth daily.     Magnesium 250 MG TABS Take 250 mg by mouth daily.     meloxicam (MOBIC) 15 MG tablet TAKE 1 TABLET BY MOUTH EVERY DAY 90 tablet 0   simvastatin  (ZOCOR) 10 MG tablet TAKE 1 TABLET BY MOUTH EVERYDAY AT BEDTIME 90 tablet 0   Turmeric 500 MG CAPS Take 500 mg by mouth daily.     vitamin E 400 UNIT capsule Take 400 Units by mouth daily.     acetaminophen (TYLENOL) 650 MG CR tablet Take 650 mg by mouth at bedtime as needed for pain.     YUVAFEM 10 MCG TABS vaginal tablet INSERT 1 TABLET TWICE A WEEK INTO VAGINA AS DIRECTED (Patient taking differently: Place 10 mcg vaginally 2 (two) times a week.) 24 tablet 0   No facility-administered medications prior to visit.    No Known Allergies  ROS Review of Systems Review of Systems:  A fourteen system review of systems was performed and found to be positive as per HPI.   Objective:    Physical Exam General:  Well Developed, well nourished, appropriate for stated age.  Neuro:  Alert and oriented,  extra-ocular muscles intact, horizontal nystagmus noted with gaze to the left HEENT:  Normocephalic, atraumatic, neck supple Skin:  no gross rash, warm, pink. Cardiac:  RRR, S1 S2 Respiratory:  ECTA B/L w/o wheezing, Not using accessory muscles, speaking in full sentences- unlabored. Vascular:  Ext warm, no cyanosis apprec.; cap RF less 2 sec. Psych:  No HI/SI, judgement and insight good, Euthymic mood. Full Affect.  BP 124/74   Pulse 68   Temp 97.8 F (36.6 C)   Ht _0  (1.626 m)   Wt 133 lb 4.8 oz (60.5 kg)   SpO2 99%   BMI 22.88 kg/m  Wt Readings from Last 3 Encounters:  03/27/21 133 lb 4.8 oz (60.5 kg)  09/27/20 134 lb 8 oz (61 kg)  03/08/20 136 lb 12.8 oz (62.1 kg)     Health Maintenance Due  Topic Date Due   Pneumococcal Vaccine 13-20 Years old (1 - PCV) Never done   COVID-19 Vaccine (3 - Pfizer risk series) 03/08/2020    There are no preventive care reminders to display for this patient.  Lab Results  Component Value Date   TSH 1.460 09/16/2019   Lab Results  Component Value Date   WBC 6.5 09/16/2019   HGB 13.8 09/16/2019   HCT 42.0 09/16/2019   MCV 95 09/16/2019    PLT 253 09/16/2019   Lab Results  Component Value Date   NA 138 04/06/2020   K 4.4 04/06/2020  CO2 24 04/06/2020   GLUCOSE 92 04/06/2020   BUN 19 04/06/2020   CREATININE 0.67 04/06/2020   BILITOT 0.8 04/06/2020   ALKPHOS 67 04/06/2020   AST 24 04/06/2020   ALT 28 04/06/2020   PROT 6.6 04/06/2020   ALBUMIN 4.4 04/06/2020   CALCIUM 9.5 04/06/2020   ANIONGAP 11 06/28/2019   Lab Results  Component Value Date   CHOL 244 (H) 04/06/2020   Lab Results  Component Value Date   HDL 94 04/06/2020   Lab Results  Component Value Date   LDLCALC 134 (H) 04/06/2020   Lab Results  Component Value Date   TRIG 93 04/06/2020   Lab Results  Component Value Date   CHOLHDL 2.6 04/06/2020   Lab Results  Component Value Date   HGBA1C 5.6 09/16/2019      Assessment & Plan:   Problem List Items Addressed This Visit       Musculoskeletal and Integument   Osteopenia-  q 2 yrs bone scan-  last one was bone density 2015 (Chronic)   Relevant Orders   Vitamin D (25 hydroxy)   Generalized OA     Other   Hyperlipidemia - Primary (Chronic)   Relevant Orders   Comp Met (CMET)   CBC w/Diff   Lipid Profile   Other Visit Diagnoses     Vertigo       Relevant Medications   meclizine (ANTIVERT) 12.5 MG tablet   History of prediabetes       Relevant Orders   HgB A1c   Healthcare maintenance       Relevant Orders   Comp Met (CMET)   CBC w/Diff   HgB A1c   Lipid Profile   TSH   Vitamin D (25 hydroxy)      HLD: - Last lipid panel: LDL 134 - Continue current medication regimen. Will repeat lipid panel and hepatic function today. - Continue heart healthy diet and stay as active as possible.   Vertigo: -Discussed with patient likely BPPV, unable to perform Dix-Hallpike maneuver due to becoming symptomatic with HGN test. Will provide with home exercises and can take meclizine as needed for dizziness. If symptoms fail to improve or worsen recommend referral to PT for vestibular  rehabilitation.  OA: -Patient takes meloxicam prn for pain. Will collect CMP for medication monitoring.  Healthcare Maintenance: -Will collect fasting labs. -Recommend to schedule CPE. -UTD with mammogram.   Meds ordered this encounter  Medications   meclizine (ANTIVERT) 12.5 MG tablet    Sig: Take 1 tablet (12.5 mg total) by mouth 3 (three) times daily as needed for dizziness.    Dispense:  30 tablet    Refill:  0    Order Specific Question:   Supervising Provider    Answer:   Beatrice Lecher D [[id number]]    Follow-up: Return in about 6 months (around 09/27/2021) for CPE.   Note:  This note was prepared with assistance of Dragon voice recognition software. Occasional wrong-word or sound-a-like substitutions may have occurred due to the inherent limitations of voice recognition software.   Lorrene Reid, PA-C

## 2021-03-27 NOTE — Patient Instructions (Addendum)
Benign Positional Vertigo Vertigo is the feeling that you or your surroundings are moving when they are not. Benign positional vertigo is the most common form of vertigo. This is usually a harmless condition (benign). This condition is positional. This means that symptoms are triggered bycertain movements and positions. This condition can be dangerous if it occurs while you are doing something that could cause harm to yourself or others. This includes activities such asdriving or operating machinery. What are the causes? The inner ear has fluid-filled canals that help your brain sense movement and balance. When the fluid moves, the brain receives messages about your body'sposition. With benign positional vertigo, calcium crystals in the inner ear break free and disturb the inner ear area. This causes your brain to receive confusingmessages about your body's position. What increases the risk? You are more likely to develop this condition if: You are a woman. You are 60 years of age or older. You have recently had a head injury. You have an inner ear disease. What are the signs or symptoms? Symptoms of this condition usually happen when you move your head or your eyes in different directions. Symptoms may start suddenly and usually last for less than a minute. They include: Loss of balance and falling. Feeling like you are spinning or moving. Feeling like your surroundings are spinning or moving. Nausea and vomiting. Blurred vision. Dizziness. Involuntary eye movement (nystagmus). Symptoms can be mild and cause only minor problems, or they can be severe and interfere with daily life. Episodes of benign positional vertigo may return (recur) over time. Symptoms may also improve over time. How is this diagnosed? This condition may be diagnosed based on: Your medical history. A physical exam of the head, neck, and ears. Positional tests to check for or stimulate vertigo. You may be asked to turn  your head and change positions, such as going from sitting to lying down. A health care provider will watch for symptoms of vertigo. You may be referred to a health care provider who specializes in ear, nose, and throat problems (ENT or otolaryngologist) or a provider who specializes in disorders of the nervous system (neurologist). How is this treated?  This condition may be treated in a session in which your health care provider moves your head in specific positions to help the displaced crystals in your inner ear move. Treatment for this condition may take several sessions. Surgerymay be needed in severe cases, but this is rare. In some cases, benign positional vertigo may resolve on its own in 2-4 weeks. Follow these instructions at home: Safety Move slowly. Avoid sudden body or head movements or certain positions, as told by your health care provider. Avoid driving or operating machinery until your health care provider says it is safe. Avoid doing any tasks that would be dangerous to you or others if vertigo occurs. If you have trouble walking or keeping your balance, try using a cane for stability. If you feel dizzy or unstable, sit down right away. Return to your normal activities as told by your health care provider. Ask your health care provider what activities are safe for you. General instructions Take over-the-counter and prescription medicines only as told by your health care provider. Drink enough fluid to keep your urine pale yellow. Keep all follow-up visits. This is important. Contact a health care provider if: You have a fever. Your condition gets worse or you develop new symptoms. Your family or friends notice any behavioral changes. You have nausea or vomiting  that gets worse. You have numbness or a prickling and tingling sensation. Get help right away if you: Have difficulty speaking or moving. Are always dizzy or faint. Develop severe headaches. Have weakness in your  legs or arms. Have changes in your hearing or vision. Develop a stiff neck. Develop sensitivity to light. These symptoms may represent a serious problem that is an emergency. Do not wait to see if the symptoms will go away. Get medical help right away. Call your local emergency services (911 in the [place] Do not drive yourself to the hospital. Summary Vertigo is the feeling that you or your surroundings are moving when they are not. Benign positional vertigo is the most common form of vertigo. This condition is caused by calcium crystals in the inner ear that become displaced. This causes a disturbance in an area of the inner ear that helps your brain sense movement and balance. Symptoms include loss of balance and falling, feeling that you or your surroundings are moving, nausea and vomiting, and blurred vision. This condition can be diagnosed based on symptoms, a physical exam, and positional tests. Follow safety instructions as told by your health care provider and keep all follow-up visits. This is important. This information is not intended to replace advice given to you by your health care provider. Make sure you discuss any questions you have with your healthcare provider. Document Revised: 07/18/2020 Document Reviewed: 07/18/2020 Elsevier Patient Education  2022 [place].  How to Treat Vertigo at Home with Exercises  What is Vertigo?  Vertigo is a relatively common symptom most often associated with conditions such as sinusitis (inflammation of your sinuses due to viruses, allergies, or bacterial infections), or an inner ear infection or ear trauma.   It can be brought on by trauma (e.g. a blow to the head or whiplash) or more serious things like minor strokes.   Symptoms can also be brought on by normal degenerative changes to your inner ear that occur with aging.  The condition tends to be more commonly seen in the elderly but it can occur in all ages.    Patients most often  complain of dizziness, as if the room is spinning around them.   Symptoms are provoked by quick head movements or changes in position like going from standing to lying in bed, or even turning over in bed.   It may present with nausea and/or vomiting, and can be very debilitating to some folks.    By far the most common cause, known as Benign Paroxysmal Positional Vertigo (BPPV), is categorized by a sudden onset of symptoms, that are intense but short-lived (60 seconds or less), which is triggered by a change in head position.   Symptoms usually dissipate if you stay in one position and do not move your head.   Within the inner ear are collections of calcium carbonate crystals referred to as "otoliths" which may become dislodged from their normal position and migrate into the semicircular canals of the inner ear, throwing off your body's ability to sense where you are in space.     Fig. 921 Anatomy of the Right Osseous Labyrinth. Antonieta Iba. Anatomy of the Human Body. 1918.            What Else Could Be Behind My Vertigo?  Some other causes of vertigo include:  Meniere's disease (disorder of inner ear with ringing in ears, feeling of fullness/pressure within ear, and fluctuating hearing loss) Tumours Neurological disorders e.g. Multiple Sclerosis Motion Sickness (  lack of coordination between visual stimuli, inner ear balance and positional sense) Migraine Labyrinthitis (inflammation of the fluid-filled tubes and sacs within the inner ear; may also be associated with changes in hearing) Vestibular neuritis (inflammation of the nerves associated with transmission of sensory info from the inner ear; usually of viral origins)  How it can be treated/cured? While certain medications have been prescribed for vertigo including Lorazepam your doing well 7 house the house going organizing and getting things ready for sale with the and Meclizine (for motion sickness), there exists no evidence to  support a recommendation of any medication in the routine treatment of BPPV.  Clinical trials have demonstrated that repositioning techniques (listed below) are a superior option for management Otis Dials et al., 2008).    Figure above:  (A) Instructions for the modified Epley procedure (MEP) for left ear posterior canal benign paroxysmal positional vertigo (PC-BPPV). For right ear BPPV, the procedure has to be performed in the opposite direction, starting with the head turned to the right side.  1. Start by sitting on a bed with your head turned 45 to the left. Place a pillow behind you so that on lying back it will be under your shoulders.  2. Lie back quickly with shoulders on the pillow, neck extended, and head resting on the bed. In this position, the affected (left) ear is underneath. Wait for 30 secondS.  3. Turn your head 90 to the right (without raising it), and wait again for 30 seconds.  4. Turn your body and head another 90 to the right, and wait for another 30 seconds.  5. Sit up on the right side. This maneuver should be performed three times a day. Repeat this daily until you are free from positional vertigo for 24 hours.   (B) Instructions for the modified Semont maneuver (MSM) for left ear PC-BPPV. For right ear BPPV, the maneuver has to be performed in the opposite direction, starting with the head turned toward the left ear.  1. Sit upright on a bed with your head turned 45 toward the right ear.  2. Drop quickly to the left side, so that your head touches the bed behind your left ear. Wait 30 seconds.  3. Move head and trunk in a swift movement toward the other side without stopping in the upright position, so that your head comes to rest on the right side of your forehead. Wait again for 30 seconds.  4. Sit up again.  This maneuver should be performed three times a day. Repeat this daily until you are free from positional vertigo symptoms for 24 hours.   (   See the video in the  supplementary material on the NeurologyWeb site; go to http://www.neurology.org/content/63/1/150/F1.expansion.html   )     You can also try this motion at home as well- Self-Treatment of Benign Paroxysmal Positional Vertigo Benign Paroxysmal Positioning Vertigo is caused by loose inner ear crystals in the inner ear that migrate while sleeping to the back-bottom inner ear balance canal, the so-called "posterior semi-circular canal." The maneuver demonstrated below is the way to reposition the loose crystals so that the symptoms caused by the loose crystals go away. You may have a floating, swaying sense while walking or sitting for a few days after this procedure.

## 2021-03-28 LAB — COMPREHENSIVE METABOLIC PANEL
ALT: 19 IU/L (ref 0–32)
AST: 22 IU/L (ref 0–40)
Albumin/Globulin Ratio: 2.3 — ABNORMAL HIGH (ref 1.2–2.2)
Albumin: 4.6 g/dL (ref 3.8–4.9)
Alkaline Phosphatase: 74 IU/L (ref 44–121)
BUN/Creatinine Ratio: 30 — ABNORMAL HIGH (ref 12–28)
BUN: 18 mg/dL (ref 8–27)
Bilirubin Total: 0.7 mg/dL (ref 0.0–1.2)
CO2: 26 mmol/L (ref 20–29)
Calcium: 9.6 mg/dL (ref 8.7–10.3)
Chloride: 104 mmol/L (ref 96–106)
Creatinine, Ser: 0.6 mg/dL (ref 0.57–1.00)
Globulin, Total: 2 g/dL (ref 1.5–4.5)
Glucose: 106 mg/dL — ABNORMAL HIGH (ref 65–99)
Potassium: 5.4 mmol/L — ABNORMAL HIGH (ref 3.5–5.2)
Sodium: 140 mmol/L (ref 134–144)
Total Protein: 6.6 g/dL (ref 6.0–8.5)
eGFR: 103 mL/min/{1.73_m2} (ref >59)

## 2021-03-28 LAB — CBC WITH DIFFERENTIAL/PLATELET
Basophils Absolute: 0.1 10*3/uL (ref 0.0–0.2)
Basos: 1 %
EOS (ABSOLUTE): 0.2 10*3/uL (ref 0.0–0.4)
Eos: 4 %
Hematocrit: 38.7 % (ref 34.0–46.6)
Hemoglobin: 12.8 g/dL (ref 11.1–15.9)
Immature Grans (Abs): 0 10*3/uL (ref 0.0–0.1)
Immature Granulocytes: 0 %
Lymphocytes Absolute: 1.8 10*3/uL (ref 0.7–3.1)
Lymphs: 28 %
MCH: 31.7 pg (ref 26.6–33.0)
MCHC: 33.1 g/dL (ref 31.5–35.7)
MCV: 96 fL (ref 79–97)
Monocytes Absolute: 0.5 10*3/uL (ref 0.1–0.9)
Monocytes: 8 %
Neutrophils Absolute: 3.9 10*3/uL (ref 1.4–7.0)
Neutrophils: 59 %
Platelets: 300 10*3/uL (ref 150–450)
RBC: 4.04 x10E6/uL (ref 3.77–5.28)
RDW: 12.2 % (ref 11.7–15.4)
WBC: 6.5 10*3/uL (ref 3.4–10.8)

## 2021-03-28 LAB — LIPID PANEL
Chol/HDL Ratio: 2.3 ratio (ref 0.0–4.4)
Cholesterol, Total: 202 mg/dL — ABNORMAL HIGH (ref 100–199)
HDL: 86 mg/dL (ref >39)
LDL Chol Calc (NIH): 103 mg/dL — ABNORMAL HIGH (ref 0–99)
Triglycerides: 72 mg/dL (ref 0–149)
VLDL Cholesterol Cal: 13 mg/dL (ref 5–40)

## 2021-03-28 LAB — VITAMIN D 25 HYDROXY (VIT D DEFICIENCY, FRACTURES): Vit D, 25-Hydroxy: 46 ng/mL (ref 30.0–100.0)

## 2021-03-28 LAB — HEMOGLOBIN A1C
Est. average glucose Bld gHb Est-mCnc: 117 mg/dL
Hgb A1c MFr Bld: 5.7 % — ABNORMAL HIGH (ref 4.8–5.6)

## 2021-03-28 LAB — TSH: TSH: 1.11 u[IU]/mL (ref 0.450–4.500)

## 2021-04-28 ENCOUNTER — Other Ambulatory Visit: Payer: Self-pay | Admitting: Orthopedic Surgery

## 2021-04-29 ENCOUNTER — Encounter
Admission: RE | Admit: 2021-04-29 | Discharge: 2021-04-29 | Disposition: A | Discharge status: Home / Self Care | Payer: BC Managed Care – PPO | Source: Ambulatory Visit | Attending: Orthopedic Surgery | Admitting: Orthopedic Surgery

## 2021-04-29 ENCOUNTER — Other Ambulatory Visit: Payer: Self-pay

## 2021-04-29 HISTORY — DX: Dizziness and giddiness: R42

## 2021-04-29 NOTE — Patient Instructions (Addendum)
Your procedure is scheduled on: May 09, 2021 THURSDAY Report to the Registration Desk on the 1st floor of the [facility]. To find out your arrival time, please call [phone] between 1PM - 3PM on: May 08, 2021 [facility]  REMEMBER: Instructions that are not followed completely may result in serious medical risk, up to and including death; or upon the discretion of your surgeon and anesthesiologist your surgery may need to be rescheduled.  DO NOT EAT OR DRINK after midnight the night before surgery.  No gum chewing, lozengers or hard candies.  TAKE THESE MEDICATIONS THE MORNING OF SURGERY WITH A SIP OF WATER: NONE  One week prior to surgery: Stop Anti-inflammatories (NSAIDS) such as Advil, Aleve, Ibuprofen, Motrin, Naproxen, Naprosyn and ASPIRIN OR Aspirin based products such as Excedrin, Goodys Powder, BC Powder. STOP MELOXICAM Stop ANY OVER THE COUNTER supplements until after surgery. You may however, continue to take Tylenol if needed for pain up until the day of surgery.  No Alcohol for 24 hours before or after surgery.  No Smoking including e-cigarettes for 24 hours prior to surgery.  No chewable tobacco products for at least 6 hours prior to surgery.  No nicotine patches on the day of surgery.  Do not use any "recreational" drugs for at least a week prior to your surgery.  Please be advised that the combination of cocaine and anesthesia may have negative outcomes, up to and including death. If you test positive for cocaine, your surgery will be cancelled.  On the morning of surgery brush your teeth with toothpaste and water, you may rinse your mouth with mouthwash if you wish. Do not swallow any toothpaste or mouthwash.  Do not wear jewelry, make-up, hairpins, clips or nail polish.  Do not wear lotions, powders, or perfumes OR DEODORANT   Do not shave body from the neck down 48 hours prior to surgery just in case you cut yourself which could leave a site for  infection.  Also, freshly shaved skin may become irritated if using the CHG soap.  Contact lenses, hearing aids and dentures may not be worn into surgery.  Do not bring valuables to the hospital. [facility] is not responsible for any missing/lost belongings or valuables.   Use CHG Soap as directed on instruction sheet.  Notify your doctor if there is any change in your medical condition (cold, fever, infection).  Wear comfortable clothing (specific to your surgery type) to the hospital.  After surgery, you can help prevent lung complications by doing breathing exercises.  Take deep breaths and cough every 1-2 hours. Your doctor may order a device called an Incentive Spirometer to help you take deep breaths. When coughing or sneezing, hold a pillow firmly against your incision with both hands. This is called "splinting." Doing this helps protect your incision. It also decreases belly discomfort.  If you are being discharged the day of surgery, you will not be allowed to drive home. You will need a responsible adult (18 years or older) to drive you home and stay with you that night.   If you are taking public transportation, you will need to have a responsible adult (18 years or older) with you. Please confirm with your physician that it is acceptable to use public transportation.   Please call the [facility] at [phone] if you have any questions about these instructions.  Surgery Visitation Policy:  Patients undergoing a surgery or procedure may have one family member or support person with  them as long as that person is not COVID-19 positive or experiencing its symptoms.  That person may remain in the waiting area during the procedure.

## 2021-04-30 ENCOUNTER — Encounter: Payer: Self-pay | Admitting: Urgent Care

## 2021-04-30 ENCOUNTER — Encounter
Admission: RE | Admit: 2021-04-30 | Discharge: 2021-04-30 | Disposition: A | Discharge status: Home / Self Care | Payer: BC Managed Care – PPO | Source: Ambulatory Visit | Attending: Orthopedic Surgery | Admitting: Orthopedic Surgery

## 2021-04-30 DIAGNOSIS — Z01812 Encounter for preprocedural laboratory examination: Secondary | ICD-10-CM | POA: Diagnosis not present

## 2021-04-30 LAB — CBC WITH DIFFERENTIAL/PLATELET
Abs Immature Granulocytes: 0.02 10*3/uL (ref 0.00–0.07)
Basophils Absolute: 0.1 10*3/uL (ref 0.0–0.1)
Basophils Relative: 1 %
Eosinophils Absolute: 0.2 10*3/uL (ref 0.0–0.5)
Eosinophils Relative: 3 %
HCT: 38.1 % (ref 36.0–46.0)
Hemoglobin: 13.1 g/dL (ref 12.0–15.0)
Immature Granulocytes: 0 %
Lymphocytes Relative: 36 %
Lymphs Abs: 2.1 10*3/uL (ref 0.7–4.0)
MCH: 33 pg (ref 26.0–34.0)
MCHC: 34.4 g/dL (ref 30.0–36.0)
MCV: 96 fL (ref 80.0–100.0)
Monocytes Absolute: 0.4 10*3/uL (ref 0.1–1.0)
Monocytes Relative: 7 %
Neutro Abs: 3 10*3/uL (ref 1.7–7.7)
Neutrophils Relative %: 53 %
Platelets: 247 10*3/uL (ref 150–400)
RBC: 3.97 MIL/uL (ref 3.87–5.11)
RDW: 13 % (ref 11.5–15.5)
WBC: 5.8 10*3/uL (ref 4.0–10.5)
nRBC: 0 % (ref 0.0–0.2)

## 2021-04-30 LAB — BASIC METABOLIC PANEL
Anion gap: 8 (ref 5–15)
BUN: 19 mg/dL (ref 6–20)
CO2: 25 mmol/L (ref 22–32)
Calcium: 9.4 mg/dL (ref 8.9–10.3)
Chloride: 105 mmol/L (ref 98–111)
Creatinine, Ser: 0.62 mg/dL (ref 0.44–1.00)
GFR, Estimated: >60 mL/min (ref >60)
Glucose, Bld: 104 mg/dL — ABNORMAL HIGH (ref 70–99)
Potassium: 4.4 mmol/L (ref 3.5–5.1)
Sodium: 138 mmol/L (ref 135–145)

## 2021-04-30 LAB — APTT: aPTT: 28 seconds (ref 24–36)

## 2021-04-30 LAB — PROTIME-INR
INR: 0.9 (ref 0.8–1.2)
Prothrombin Time: 11.9 seconds (ref 11.4–15.2)

## 2021-05-06 ENCOUNTER — Other Ambulatory Visit: Payer: Self-pay | Admitting: Physician Assistant

## 2021-05-06 DIAGNOSIS — M159 Polyosteoarthritis, unspecified: Secondary | ICD-10-CM

## 2021-05-06 DIAGNOSIS — E782 Mixed hyperlipidemia: Secondary | ICD-10-CM

## 2021-05-09 ENCOUNTER — Ambulatory Visit: Payer: BC Managed Care – PPO | Admitting: Anesthesiology

## 2021-05-09 ENCOUNTER — Encounter: Payer: Self-pay | Admitting: Orthopedic Surgery

## 2021-05-09 ENCOUNTER — Encounter
Admission: RE | Disposition: A | Discharge status: Home / Self Care | Payer: Self-pay | Source: Home / Self Care | Attending: Orthopedic Surgery

## 2021-05-09 ENCOUNTER — Ambulatory Visit
Admission: RE | Admit: 2021-05-09 | Discharge: 2021-05-09 | Disposition: A | Discharge status: Home / Self Care | Payer: BC Managed Care – PPO | Attending: Orthopedic Surgery | Admitting: Orthopedic Surgery

## 2021-05-09 ENCOUNTER — Other Ambulatory Visit: Payer: Self-pay

## 2021-05-09 DIAGNOSIS — X58XXXA Exposure to other specified factors, initial encounter: Secondary | ICD-10-CM | POA: Insufficient documentation

## 2021-05-09 DIAGNOSIS — M659 Synovitis and tenosynovitis, unspecified: Secondary | ICD-10-CM | POA: Insufficient documentation

## 2021-05-09 DIAGNOSIS — M1711 Unilateral primary osteoarthritis, right knee: Secondary | ICD-10-CM | POA: Diagnosis not present

## 2021-05-09 DIAGNOSIS — Z79899 Other long term (current) drug therapy: Secondary | ICD-10-CM | POA: Diagnosis not present

## 2021-05-09 DIAGNOSIS — S83231A Complex tear of medial meniscus, current injury, right knee, initial encounter: Secondary | ICD-10-CM | POA: Diagnosis not present

## 2021-05-09 DIAGNOSIS — S83241A Other tear of medial meniscus, current injury, right knee, initial encounter: Secondary | ICD-10-CM | POA: Diagnosis not present

## 2021-05-09 DIAGNOSIS — M94261 Chondromalacia, right knee: Secondary | ICD-10-CM | POA: Insufficient documentation

## 2021-05-09 DIAGNOSIS — S83206A Unspecified tear of unspecified meniscus, current injury, right knee, initial encounter: Secondary | ICD-10-CM | POA: Diagnosis not present

## 2021-05-09 DIAGNOSIS — Z87891 Personal history of nicotine dependence: Secondary | ICD-10-CM | POA: Insufficient documentation

## 2021-05-09 HISTORY — PX: KNEE ARTHROSCOPY WITH MEDIAL MENISECTOMY: SHX5651

## 2021-05-09 SURGERY — ARTHROSCOPY, KNEE, WITH MEDIAL MENISCECTOMY
Anesthesia: General | Site: Knee | Laterality: Right

## 2021-05-09 MED ORDER — MIDAZOLAM HCL 2 MG/2ML IJ SOLN
INTRAMUSCULAR | Status: AC
  Filled 2021-05-09: qty 2

## 2021-05-09 MED ORDER — MIDAZOLAM HCL 2 MG/2ML IJ SOLN
INTRAMUSCULAR | Status: DC | PRN
  Administered 2021-05-09: 2 mg via INTRAVENOUS

## 2021-05-09 MED ORDER — FENTANYL CITRATE (PF) 100 MCG/2ML IJ SOLN
INTRAMUSCULAR | Status: AC
  Filled 2021-05-09: qty 2

## 2021-05-09 MED ORDER — LIDOCAINE HCL (PF) 1 % IJ SOLN
INTRAMUSCULAR | Status: AC
  Filled 2021-05-09: qty 30

## 2021-05-09 MED ORDER — LIDOCAINE HCL (CARDIAC) PF 100 MG/5ML IV SOSY
PREFILLED_SYRINGE | INTRAVENOUS | Status: DC | PRN
  Administered 2021-05-09: 60 mg via INTRAVENOUS

## 2021-05-09 MED ORDER — MEPERIDINE HCL 25 MG/ML IJ SOLN: 6.2500 mg | INTRAMUSCULAR | Status: DC | PRN

## 2021-05-09 MED ORDER — DEXMEDETOMIDINE HCL IN NACL 200 MCG/50ML IV SOLN
INTRAVENOUS | Status: AC
  Filled 2021-05-09: qty 50

## 2021-05-09 MED ORDER — FENTANYL CITRATE (PF) 100 MCG/2ML IJ SOLN: 25.0000 ug | INTRAMUSCULAR | Status: DC | PRN

## 2021-05-09 MED ORDER — CHLORHEXIDINE GLUCONATE CLOTH 2 % EX PADS: 6.0000 | MEDICATED_PAD | Freq: Once | CUTANEOUS | Status: DC

## 2021-05-09 MED ORDER — LACTATED RINGERS IR SOLN
Status: DC | PRN
  Administered 2021-05-09 (×4): 3000 mL

## 2021-05-09 MED ORDER — FENTANYL CITRATE (PF) 100 MCG/2ML IJ SOLN
INTRAMUSCULAR | Status: DC | PRN
  Administered 2021-05-09 (×4): 25 ug via INTRAVENOUS

## 2021-05-09 MED ORDER — CELECOXIB 200 MG PO CAPS: 200.0000 mg | ORAL_CAPSULE | ORAL | Status: AC

## 2021-05-09 MED ORDER — PROPOFOL 10 MG/ML IV BOLUS
INTRAVENOUS | Status: AC
  Filled 2021-05-09: qty 20

## 2021-05-09 MED ORDER — ASPIRIN EC 325 MG PO TBEC: 325.0000 mg | DELAYED_RELEASE_TABLET | Freq: Every day | ORAL | 0 refills | Status: DC

## 2021-05-09 MED ORDER — CHLORHEXIDINE GLUCONATE 0.12 % MT SOLN
OROMUCOSAL | Status: AC
  Administered 2021-05-09: 15 mL via OROMUCOSAL
  Filled 2021-05-09: qty 15

## 2021-05-09 MED ORDER — LIDOCAINE HCL (PF) 1 % IJ SOLN
INTRAMUSCULAR | Status: DC | PRN
  Administered 2021-05-09: 5 mL

## 2021-05-09 MED ORDER — CHLORHEXIDINE GLUCONATE CLOTH 2 % EX PADS
6.0000 | MEDICATED_PAD | Freq: Once | CUTANEOUS | Status: AC
  Administered 2021-05-09: 6 via TOPICAL

## 2021-05-09 MED ORDER — DOCUSATE SODIUM 100 MG PO CAPS: 100.0000 mg | ORAL_CAPSULE | Freq: Every day | ORAL | 2 refills | Status: DC | PRN

## 2021-05-09 MED ORDER — ONDANSETRON HCL 4 MG PO TABS: 4.0000 mg | ORAL_TABLET | Freq: Three times a day (TID) | ORAL | 0 refills | Status: DC | PRN

## 2021-05-09 MED ORDER — FAMOTIDINE 20 MG PO TABS
ORAL_TABLET | ORAL | Status: AC
  Administered 2021-05-09: 20 mg via ORAL
  Filled 2021-05-09: qty 1

## 2021-05-09 MED ORDER — DEXMEDETOMIDINE (PRECEDEX) IN NS 20 MCG/5ML (4 MCG/ML) IV SYRINGE
PREFILLED_SYRINGE | INTRAVENOUS | Status: DC | PRN
  Administered 2021-05-09: 8 ug via INTRAVENOUS

## 2021-05-09 MED ORDER — ORAL CARE MOUTH RINSE: 15.0000 mL | Freq: Once | OROMUCOSAL | Status: AC

## 2021-05-09 MED ORDER — CHLORHEXIDINE GLUCONATE 0.12 % MT SOLN: 15.0000 mL | Freq: Once | OROMUCOSAL | Status: AC

## 2021-05-09 MED ORDER — FAMOTIDINE 20 MG PO TABS: 20.0000 mg | ORAL_TABLET | Freq: Once | ORAL | Status: AC

## 2021-05-09 MED ORDER — HYDROCODONE-ACETAMINOPHEN 5-325 MG PO TABS: 1.0000 | ORAL_TABLET | ORAL | 0 refills | Status: DC | PRN

## 2021-05-09 MED ORDER — HYDROCODONE-ACETAMINOPHEN 5-325 MG PO TABS
ORAL_TABLET | ORAL | Status: AC
  Filled 2021-05-09: qty 1

## 2021-05-09 MED ORDER — LACTATED RINGERS IV SOLN: INTRAVENOUS | Status: DC

## 2021-05-09 MED ORDER — PROPOFOL 500 MG/50ML IV EMUL
INTRAVENOUS | Status: DC | PRN
  Administered 2021-05-09: 100 ug/kg/min via INTRAVENOUS

## 2021-05-09 MED ORDER — BUPIVACAINE-EPINEPHRINE (PF) 0.25% -1:200000 IJ SOLN
INTRAMUSCULAR | Status: AC
  Filled 2021-05-09: qty 30

## 2021-05-09 MED ORDER — ACETAMINOPHEN 500 MG PO TABS
ORAL_TABLET | ORAL | Status: AC
  Administered 2021-05-09: 1000 mg via ORAL
  Filled 2021-05-09: qty 2

## 2021-05-09 MED ORDER — LIDOCAINE HCL (PF) 2 % IJ SOLN
INTRAMUSCULAR | Status: AC
  Filled 2021-05-09: qty 5

## 2021-05-09 MED ORDER — BUPIVACAINE-EPINEPHRINE (PF) 0.25% -1:200000 IJ SOLN
INTRAMUSCULAR | Status: DC | PRN
  Administered 2021-05-09: 20 mL

## 2021-05-09 MED ORDER — CEFAZOLIN SODIUM-DEXTROSE 2-4 GM/100ML-% IV SOLN
INTRAVENOUS | Status: AC
  Filled 2021-05-09: qty 100

## 2021-05-09 MED ORDER — CELECOXIB 200 MG PO CAPS
ORAL_CAPSULE | ORAL | Status: AC
  Administered 2021-05-09: 200 mg via ORAL
  Filled 2021-05-09: qty 1

## 2021-05-09 MED ORDER — CEFAZOLIN SODIUM-DEXTROSE 2-4 GM/100ML-% IV SOLN
2.0000 g | INTRAVENOUS | Status: AC
  Administered 2021-05-09: 2 g via INTRAVENOUS

## 2021-05-09 MED ORDER — PROPOFOL 10 MG/ML IV BOLUS
INTRAVENOUS | Status: AC
  Filled 2021-05-09: qty 40

## 2021-05-09 MED ORDER — HYDROCODONE-ACETAMINOPHEN 5-325 MG PO TABS
1.0000 | ORAL_TABLET | Freq: Once | ORAL | Status: AC
  Administered 2021-05-09: 1 via ORAL

## 2021-05-09 MED ORDER — DEXAMETHASONE SODIUM PHOSPHATE 10 MG/ML IJ SOLN
INTRAMUSCULAR | Status: DC | PRN
Start: 2021-05-09 — End: 2021-05-09
  Administered 2021-05-09: 5 mg via INTRAVENOUS

## 2021-05-09 MED ORDER — ACETAMINOPHEN 500 MG PO TABS: 1000.0000 mg | ORAL_TABLET | ORAL | Status: AC

## 2021-05-09 MED ORDER — ONDANSETRON HCL 4 MG/2ML IJ SOLN: 4.0000 mg | Freq: Once | INTRAMUSCULAR | Status: DC | PRN

## 2021-05-09 MED ORDER — ONDANSETRON HCL 4 MG/2ML IJ SOLN
INTRAMUSCULAR | Status: DC | PRN
  Administered 2021-05-09: 4 mg via INTRAVENOUS

## 2021-05-09 MED ORDER — PROPOFOL 10 MG/ML IV BOLUS
INTRAVENOUS | Status: DC | PRN
  Administered 2021-05-09 (×2): 40 mg via INTRAVENOUS
  Administered 2021-05-09: 120 mg via INTRAVENOUS
  Administered 2021-05-09: 50 mg via INTRAVENOUS

## 2021-05-09 SURGICAL SUPPLY — 40 items
ADAPTER IRRIG TUBE 2 SPIKE SOL (ADAPTER) ×4 IMPLANT
ADPR TBG 2 SPK PMP STRL ASCP (ADAPTER) ×2
BLADE FULL RADIUS 3.5 (BLADE) ×2 IMPLANT
BLADE SHAVER 4.5X7 STR FR (MISCELLANEOUS) ×2 IMPLANT
BUR BR 5.5 WIDE MOUTH (BURR) IMPLANT
COOLER POLAR GLACIER W/PUMP (MISCELLANEOUS) ×2 IMPLANT
CUFF TOURN SGL QUICK 24 (TOURNIQUET CUFF)
CUFF TOURN SGL QUICK 34 (TOURNIQUET CUFF)
CUFF TRNQT CYL 24X4X16.5-23 (TOURNIQUET CUFF) IMPLANT
CUFF TRNQT CYL 34X4.125X (TOURNIQUET CUFF) IMPLANT
DRAPE IMP U-DRAPE 54X76 (DRAPES) ×2 IMPLANT
DURAPREP 26ML APPLICATOR (WOUND CARE) ×6 IMPLANT
GAUZE SPONGE 4X4 12PLY STRL (GAUZE/BANDAGES/DRESSINGS) ×2 IMPLANT
GAUZE XEROFORM 1X8 LF (GAUZE/BANDAGES/DRESSINGS) ×2 IMPLANT
GLOVE SURG ORTHO LTX SZ9 (GLOVE) ×4 IMPLANT
GLOVE SURG UNDER POLY LF SZ9 (GLOVE) ×2 IMPLANT
GOWN STRL REUS W/ TWL LRG LVL3 (GOWN DISPOSABLE) ×1 IMPLANT
GOWN STRL REUS W/TWL 2XL LVL3 (GOWN DISPOSABLE) ×2 IMPLANT
GOWN STRL REUS W/TWL LRG LVL3 (GOWN DISPOSABLE) ×2
IV LACTATED RINGER IRRG 3000ML (IV SOLUTION) ×8
IV LR IRRIG 3000ML ARTHROMATIC (IV SOLUTION) ×4 IMPLANT
KIT TURNOVER KIT A (KITS) ×2 IMPLANT
MANIFOLD NEPTUNE II (INSTRUMENTS) ×4 IMPLANT
MAT ABSORB  FLUID 56X50 GRAY (MISCELLANEOUS) ×1
MAT ABSORB FLUID 56X50 GRAY (MISCELLANEOUS) ×1 IMPLANT
NEEDLE HYPO 22GX1.5 SAFETY (NEEDLE) ×2 IMPLANT
PACK ARTHROSCOPY KNEE (MISCELLANEOUS) ×2 IMPLANT
PAD ABD DERMACEA PRESS 5X9 (GAUZE/BANDAGES/DRESSINGS) ×4 IMPLANT
PAD WRAPON POLAR KNEE (MISCELLANEOUS) ×1 IMPLANT
SOL PREP PVP 2OZ (MISCELLANEOUS) ×2
SOLUTION PREP PVP 2OZ (MISCELLANEOUS) ×1 IMPLANT
SPONGE T-LAP 18X18 ~~LOC~~+RFID (SPONGE) ×2 IMPLANT
STRIP CLOSURE SKIN 1/2X4 (GAUZE/BANDAGES/DRESSINGS) ×2 IMPLANT
SUT ETHILON 4-0 (SUTURE) ×2
SUT ETHILON 4-0 FS2 18XMFL BLK (SUTURE) ×1
SUTURE ETHLN 4-0 FS2 18XMF BLK (SUTURE) ×1 IMPLANT
TUBING INFLOW SET DBFLO PUMP (TUBING) ×2 IMPLANT
TUBING OUTFLOW SET DBLFO PUMP (TUBING) ×2 IMPLANT
WAND WEREWOLF FLOW 90D (MISCELLANEOUS) ×2 IMPLANT
WRAPON POLAR PAD KNEE (MISCELLANEOUS) ×2

## 2021-05-09 NOTE — Anesthesia Postprocedure Evaluation (Signed)
Anesthesia Post Note  Patient: Rachael Clark  Procedure(s) Performed: KNEE ARTHROSCOPY WITH MEDIAL MENISECTOMY (Right: Knee)  Patient location during evaluation: PACU Anesthesia Type: General Level of consciousness: awake and alert, awake and oriented Pain management: pain level controlled Vital Signs Assessment: post-procedure vital signs reviewed and stable Respiratory status: spontaneous breathing, nonlabored ventilation and respiratory function stable Cardiovascular status: blood pressure returned to baseline and stable Postop Assessment: no apparent nausea or vomiting Anesthetic complications: no   No notable events documented.   Last Vitals:  Vitals:   05/09/21 1645 05/09/21 1657  BP:  123/61  Pulse:  68  Resp:  16  Temp: 36.7 C 36.6 C  SpO2:  100%    Last Pain:  Vitals:   05/09/21 1657  TempSrc: Temporal  PainSc: 0-No pain                 Phill Mutter

## 2021-05-09 NOTE — Anesthesia Procedure Notes (Signed)
Procedure Name: LMA Insertion Date/Time: 05/09/2021 2:47 PM Performed by: Hedda Slade, CRNA Pre-anesthesia Checklist: Patient identified, Patient being monitored, Timeout performed, Emergency Drugs available and Suction available Patient Re-evaluated:Patient Re-evaluated prior to induction Oxygen Delivery Method: Circle system utilized Preoxygenation: Pre-oxygenation with 100% oxygen Induction Type: IV induction Ventilation: Mask ventilation without difficulty LMA: LMA inserted LMA Size: 4.0 Tube type: Oral Number of attempts: 1 Placement Confirmation: positive ETCO2 and breath sounds checked- equal and bilateral Tube secured with: Tape Dental Injury: Teeth and Oropharynx as per pre-operative assessment

## 2021-05-09 NOTE — Op Note (Signed)
PATIENT:  Rachael Clark  PRE-OPERATIVE DIAGNOSIS:  TEAR OF MEDIAL MENISCUS, RIGHT KNEE  POST-OPERATIVE DIAGNOSIS:  Right knee Medial meniscus tear, chondromalacia of the medial femoral condyle, lateral tibial plateau, undersurface of the patella and trochlea, synovitis  PROCEDURE:  RIGHT KNEE ARTHROSCOPY WITH  Partial MEDIAL MENISECTOMY, chondroplasty of the undersurface of the patella, trochlea and medial femoral condyle and limited synovectomy  SURGEON:  Thornton Park, MD  ANESTHESIA:   General  PREOPERATIVE INDICATIONS:  Rachael Clark  60 y.o. female with a diagnosis of RIGHT KNEE TEAR OF MEDIAL MENISCUS who failed conservative management and elected for surgical management.    The risks benefits and alternatives were discussed with the patient preoperatively including the risks of infection, bleeding, nerve injury, knee stiffness, persistent pain, osteoarthritis and the need for further surgery. Medical  risks include DVT and pulmonary embolism, myocardial infarction, stroke, pneumonia, respiratory failure and death. The patient understood these risks and wished to proceed.  OPERATIVE FINDINGS:  Right knee complex medial meniscus tear with high grade partial radial tear near the posterior root, chondromalacia of the medial femoral condyle, including a grade 3 focal lesion and more diffuse grade I and 2 chondromalacia.  The lateral tibial plateau had diffuse grade I/2 chondromalacia. The undersurface of the patella had a small area of grade 3 chondral lesion.  The  trochlea also had diffuse grade 2 and 3 chondromalacia.  There was diffuse tricompartmental synovitis.  OPERATIVE PROCEDURE: Patient was met in the preoperative area. The operative extremity was signed with the word yes and my initials according the hospital's correct site of surgery protocol.  The patient was brought to the operating room where they was placed supine on the operative table. General  anesthesia was administered. The patient was prepped and draped in a sterile fashion.  A timeout was performed to verify the patient's name, date of birth, medical record number, correct site of surgery correct procedure to be performed. It was also used to verify the patient received antibiotics that all appropriate instruments, and radiographic studies were available in the room. Once all in attendance were in agreement, the case began.  Proposed arthroscopy incisions were drawn out with a surgical marker. These were pre-injected with 1% lidocaine plain. An 11 blade was used to establish an inferior lateral and inferomedial portals. The inferomedial portal was created using a 18-gauge spinal needle under direct visualization.  A full diagnostic examination of the knee was performed including the suprapatellar pouch, patellofemoral joint, medial lateral compartments as well as the medial lateral gutters, the intercondylar notch in the posterior knee.  Patient had the posterior medial meniscal tear treated with a [employer] Flyer shaver blade and straight duckbill basket. The meniscus was debrided until a stable rim was achieved. A chondroplasty of the medial femoral condyle, trochlea and undersurface of patella was also performed using the Flyer shaver blade. A partial synovectomy was also performed using a 4-0 resector shaver blade and 90 ArthroCare wand.  The knee was then copiously lavaged. All arthroscopic instruments were removed. The 2 arthroscopy portals were closed with 4-0 nylon. Steri-Strips were applied along with a dry sterile and compressive dressing. Patient was injected with 0.25% marcaine with epinephrine for assistance with post-op pain management.  The patient was brought to the PACU in stable condition. I was scrubbed and present for the entire case and all sharp and instrument counts were correct at the conclusion the case. I spoke with the patient's fiance postoperatively to  let him  know the case was performed without complication and the patient was stable in the recovery room.  I reviewed with him the post-op instructions.    Timoteo Gaul, MD

## 2021-05-09 NOTE — Anesthesia Preprocedure Evaluation (Signed)
Anesthesia Evaluation  Patient identified by MRN, date of birth, ID band Patient awake    Reviewed: Allergy & Precautions, H&P , NPO status , Patient's Chart, lab work & pertinent test results, reviewed documented beta blocker date and time   History of Anesthesia Complications (+) PONV and history of anesthetic complications  Airway Mallampati: II   Neck ROM: full    Dental no notable dental hx.    Pulmonary neg pulmonary ROS, former smoker,    Pulmonary exam normal        Cardiovascular negative cardio ROS Normal cardiovascular exam Rhythm:regular Rate:Normal     Neuro/Psych  Neuromuscular disease negative psych ROS   GI/Hepatic negative GI ROS, Neg liver ROS,   Endo/Other  negative endocrine ROS  Renal/GU Renal disease  negative genitourinary   Musculoskeletal  (+) Arthritis , Osteoarthritis,    Abdominal   Peds  Hematology negative hematology ROS (+)   Anesthesia Other Findings Benign breast lumps    History of kidney stones   Hyperlipidemia    Kidney stone    Osteopenia    PONV  OCCASIONALLY  Vertigo       Reproductive/Obstetrics negative OB ROS                             Anesthesia Physical  Anesthesia Plan  ASA: 2  Anesthesia Plan: General   Post-op Pain Management:    Induction: Intravenous  PONV Risk Score and Plan: 3 and Propofol infusion and Midazolam  Airway Management Planned: LMA  Additional Equipment:   Intra-op Plan:   Post-operative Plan: Extubation in OR  Informed Consent: I have reviewed the patients History and Physical, chart, labs and discussed the procedure including the risks, benefits and alternatives for the proposed anesthesia with the patient or authorized representative who has indicated his/her understanding and acceptance.     Dental Advisory Given  Plan Discussed with: CRNA, Anesthesiologist and Surgeon  Anesthesia Plan  Comments:         Anesthesia Quick Evaluation

## 2021-05-09 NOTE — Discharge Instructions (Signed)

## 2021-05-09 NOTE — Transfer of Care (Signed)
Immediate Anesthesia Transfer of Care Note  Patient: Rachael Clark  Procedure(s) Performed: KNEE ARTHROSCOPY WITH MEDIAL MENISECTOMY (Right: Knee)  Patient Location: PACU  Anesthesia Type:General  Level of Consciousness: sedated  Airway & Oxygen Therapy: Patient Spontanous Breathing and Patient connected to face mask oxygen  Post-op Assessment: Report given to RN and Post -op Vital signs reviewed and stable  Post vital signs: Reviewed and stable  Last Vitals:  Vitals Value Taken Time  BP 85/52 05/09/21 1603  Temp 36.4 C 05/09/21 1603  Pulse 58 05/09/21 1603  Resp 17 05/09/21 1603  SpO2 99 % 05/09/21 1603  Vitals shown include unvalidated device data.  Last Pain:  Vitals:   05/09/21 1603  TempSrc:   PainSc: 0-No pain         Complications: No notable events documented.

## 2021-05-09 NOTE — H&P (Addendum)
PREOPERATIVE H&P  Chief Complaint: Right Knee Meniscus Tear  HPI: Rachael Clark is a 60 y.o. female who presents for preoperative history and physical with a diagnosis of Right Knee Meniscus Tear confirmed by MRI. Symptoms of pain, clicking and popping are significantly impairing activities of daily living.  Patient has failed nonoperative management wished to proceed with surgery for a partial medial meniscectomy.   Past Medical History:  Diagnosis Date   Benign breast lumps    History of kidney stones    Hyperlipidemia    Hyperlipidemia    Kidney stone    Osteopenia    PONV (postoperative nausea and vomiting)    OCCASIONALLY   Vertigo    Past Surgical History:  Procedure Laterality Date   ABDOMINAL HYSTERECTOMY     ABDOMINOPLASTY     APPENDECTOMY     BREAST BIOPSY Bilateral    BREAST LUMPECTOMY Bilateral    CESAREAN SECTION     x 1   CHOLECYSTECTOMY     COLONOSCOPY WITH PROPOFOL N/A 07/22/2019   Procedure: COLONOSCOPY WITH PROPOFOL;  Surgeon: Virgel Manifold, MD;  Location: [facility];  Service: Endoscopy;  Laterality: N/A;   INCONTINENCE SURGERY     TVT   INGUINAL HERNIA REPAIR Right 05/02/2019   Procedure: LAPAROSCOPIC RIGHT INGUINAL HERNIA REPAIR;  Surgeon: Ralene Ok, MD;  Location: [place];  Service: General;  Laterality: Right;   INSERTION OF MESH Right 05/02/2019   Procedure: Insertion Of Mesh;  Surgeon: Ralene Ok, MD;  Location: [place];  Service: General;  Laterality: Right;   NOSE SURGERY     OVARIAN CYST REMOVAL     TONSILLECTOMY     TOTAL VAGINAL HYSTERECTOMY  2007   still has her tubes and ovaries   UMBILICAL HERNIA REPAIR     WRIST SURGERY Right    Social History   Socioeconomic History   Marital status: Divorced    Spouse name: Not on file   Number of children: Not on file   Years of education: Not on file   Highest education level: Not on file  Occupational History   Not on file  Tobacco Use   Smoking status: Former     Packs/day: 0.25    Years: 4.00    Pack years: 1.00    Types: Cigarettes    Quit date: 05/02/1996    Years since quitting: 25.0   Smokeless tobacco: Never  Vaping Use   Vaping Use: Never used  Substance and Sexual Activity   Alcohol use: Yes    Alcohol/week: 4.0 standard drinks    Types: 4 Glasses of wine per week   Drug use: No   Sexual activity: Yes    Partners: Male    Birth control/protection: Surgical  Other Topics Concern   Not on file  Social History Narrative   Not on file   Social Determinants of Health   Financial Resource Strain: Not on file  Food Insecurity: Not on file  Transportation Needs: Not on file  Physical Activity: Not on file  Stress: Not on file  Social Connections: Not on file   Family History  Problem Relation Age of Onset   Osteoporosis Mother    Dementia Mother    Osteopenia Mother    Healthy Father    Healthy Sister    Healthy Brother    Healthy Daughter    Healthy Son    Healthy Sister    Healthy Sister    Healthy Brother  Healthy Son    Uterine cancer Maternal Grandmother    No Known Allergies Prior to Admission medications   Medication Sig Start Date End Date Taking? Authorizing Provider  acetaminophen (TYLENOL) 650 MG CR tablet Take 650 mg by mouth every 8 (eight) hours as needed for pain.   Yes [provider]  Ascorbic Acid (VITAMIN C) 500 MG CAPS Take 500 mg by mouth daily.   Yes [provider]  Biotin 5 MG CAPS Take 5 mg by mouth daily.   Yes [provider]  Calcium Carb-Cholecalciferol (CALCIUM 600+D) 600-800 MG-UNIT TABS Take 1 tablet by mouth daily.   Yes [provider]  Cholecalciferol (VITAMIN D) 50 MCG (2000 UT) CAPS Take 2,000 Units by mouth daily.   Yes [provider]  Cyanocobalamin (B-12) 5000 MCG CAPS Take 5,000 mcg by mouth daily.   Yes [provider]  Magnesium 250 MG TABS Take 250 mg by mouth daily.   Yes [provider]  meclizine  (ANTIVERT) 12.5 MG tablet Take 1 tablet (12.5 mg total) by mouth 3 (three) times daily as needed for dizziness. 03/27/21  Yes Abonza, Maritza, PA-C  Meloxicam 10 MG CAPS Take by mouth at bedtime as needed.   Yes [provider]  simvastatin (ZOCOR) 10 MG tablet TAKE 1 TABLET BY MOUTH EVERYDAY AT BEDTIME 05/07/21  Yes Abonza, Maritza, PA-C  Turmeric 450 MG CAPS Take 450 mg by mouth daily.   Yes [provider]  vitamin E 400 UNIT capsule Take 400 Units by mouth daily. 180 mg   Yes [provider]     Positive ROS: All other systems have been reviewed and were otherwise negative with the exception of those mentioned in the HPI and as above.  Physical Exam: General: Alert, no acute distress Cardiovascular: Regular rate and rhythm, no murmurs rubs or gallops.  No pedal edema Respiratory: Clear to auscultation bilaterally, no wheezes rales or rhonchi. No cyanosis, no use of accessory musculature GI: No organomegaly, abdomen is soft and non-tender nondistended with positive bowel sounds. Skin: Skin intact, no lesions within the operative field. Neurologic: Sensation intact distally Psychiatric: Patient is competent for consent with normal mood and affect Lymphatic: No cervical lymphadenopathy  MUSCULOSKELETAL:   Right Knee: The patient's range of motion is from 0 to approximately 115-120 degrees today. She has tenderness over the medial joint line and pain with McMurray's testing. She has patellofemoral crepitus, but no grind or apprehension. She has no ligamentous laxity. She has 5/5 strength in all muscle groups, intact sensation to light touch and palpable pedal pulses.   RADIOLOGY:  MRI RESULTS   1. Complex trizonal tear posterior horn and body medial meniscus is age indeterminate.  Radial tear posterior horn root. 2. Grade 1 sprain MCL favored to be recent. 3. Mild patellofemoral and medial compartment arthropathy.  Intermediate grade chondromalacia. 4.  Capsulitis.  Assessment: Right Knee Meniscus Tear  Plan: Plan for Procedure(s): RIGHT KNEE ARTHROSCOPIC PARTIAL MEDIAL MENISECTOMY  I met with the patient the preoperative area.  A preop history and physical was performed at the bedside.  Her right knee was marked according to the hospital's correct site of surgery after verbally confirming with the patient that this was the correct site of surgery.  I reviewed the patient's MRI in preparation for this case.  I discussed the risks and benefits of surgery. The risks include but are not limited to infection, bleeding, nerve or blood vessel injury, joint stiffness or loss of motion,  persistent pain, weakness or instability, retear of the meniscus and the need for further surgery. Medical risks include but are not limited to DVT and pulmonary embolism, myocardial infarction, stroke, pneumonia, respiratory failure and death. Patient understood these risks and wished to proceed.    Thornton Park, MD   05/09/2021 2:38 PM

## 2021-05-10 ENCOUNTER — Encounter: Payer: Self-pay | Admitting: Orthopedic Surgery

## 2021-05-17 ENCOUNTER — Other Ambulatory Visit: Payer: Self-pay | Admitting: Physician Assistant

## 2021-05-17 DIAGNOSIS — M25661 Stiffness of right knee, not elsewhere classified: Secondary | ICD-10-CM | POA: Diagnosis not present

## 2021-05-17 DIAGNOSIS — M159 Polyosteoarthritis, unspecified: Secondary | ICD-10-CM

## 2021-05-17 DIAGNOSIS — M25561 Pain in right knee: Secondary | ICD-10-CM | POA: Diagnosis not present

## 2021-05-20 DIAGNOSIS — M25561 Pain in right knee: Secondary | ICD-10-CM | POA: Diagnosis not present

## 2021-05-20 DIAGNOSIS — M25661 Stiffness of right knee, not elsewhere classified: Secondary | ICD-10-CM | POA: Diagnosis not present

## 2021-05-23 DIAGNOSIS — M25661 Stiffness of right knee, not elsewhere classified: Secondary | ICD-10-CM | POA: Diagnosis not present

## 2021-05-23 DIAGNOSIS — M25561 Pain in right knee: Secondary | ICD-10-CM | POA: Diagnosis not present

## 2021-05-29 DIAGNOSIS — M25561 Pain in right knee: Secondary | ICD-10-CM | POA: Diagnosis not present

## 2021-05-29 DIAGNOSIS — M25661 Stiffness of right knee, not elsewhere classified: Secondary | ICD-10-CM | POA: Diagnosis not present

## 2021-06-04 DIAGNOSIS — M25561 Pain in right knee: Secondary | ICD-10-CM | POA: Diagnosis not present

## 2021-06-04 DIAGNOSIS — M25661 Stiffness of right knee, not elsewhere classified: Secondary | ICD-10-CM | POA: Diagnosis not present

## 2021-06-07 DIAGNOSIS — M25661 Stiffness of right knee, not elsewhere classified: Secondary | ICD-10-CM | POA: Diagnosis not present

## 2021-06-07 DIAGNOSIS — M25561 Pain in right knee: Secondary | ICD-10-CM | POA: Diagnosis not present

## 2021-06-11 DIAGNOSIS — M25661 Stiffness of right knee, not elsewhere classified: Secondary | ICD-10-CM | POA: Diagnosis not present

## 2021-06-11 DIAGNOSIS — M25561 Pain in right knee: Secondary | ICD-10-CM | POA: Diagnosis not present

## 2021-06-14 DIAGNOSIS — M25661 Stiffness of right knee, not elsewhere classified: Secondary | ICD-10-CM | POA: Diagnosis not present

## 2021-06-14 DIAGNOSIS — M25561 Pain in right knee: Secondary | ICD-10-CM | POA: Diagnosis not present

## 2021-06-18 DIAGNOSIS — M25561 Pain in right knee: Secondary | ICD-10-CM | POA: Diagnosis not present

## 2021-06-18 DIAGNOSIS — M25661 Stiffness of right knee, not elsewhere classified: Secondary | ICD-10-CM | POA: Diagnosis not present

## 2021-06-28 ENCOUNTER — Other Ambulatory Visit: Payer: Self-pay | Admitting: Physician Assistant

## 2021-06-28 ENCOUNTER — Telehealth: Payer: Self-pay | Admitting: Physician Assistant

## 2021-06-28 ENCOUNTER — Ambulatory Visit (INDEPENDENT_AMBULATORY_CARE_PROVIDER_SITE_OTHER): Payer: BC Managed Care – PPO

## 2021-06-28 ENCOUNTER — Other Ambulatory Visit: Payer: Self-pay

## 2021-06-28 DIAGNOSIS — Z23 Encounter for immunization: Secondary | ICD-10-CM

## 2021-06-28 MED ORDER — MELOXICAM 10 MG PO CAPS
1.0000 | ORAL_CAPSULE | Freq: Every evening | ORAL | 0 refills | Status: DC | PRN
Start: 2021-06-28 — End: 2021-09-11

## 2021-06-28 NOTE — Telephone Encounter (Signed)
Patient is requesting for [place] to refill her Meloxicam. The original prescriber was a MD when she was in the hospital. AS, CMA

## 2021-06-28 NOTE — Telephone Encounter (Signed)
Error

## 2021-08-03 ENCOUNTER — Other Ambulatory Visit: Payer: Self-pay | Admitting: Physician Assistant

## 2021-08-03 DIAGNOSIS — E782 Mixed hyperlipidemia: Secondary | ICD-10-CM

## 2021-09-11 ENCOUNTER — Other Ambulatory Visit: Payer: Self-pay | Admitting: Physician Assistant

## 2021-09-17 ENCOUNTER — Other Ambulatory Visit: Payer: Self-pay | Admitting: Physician Assistant

## 2021-09-17 DIAGNOSIS — M159 Polyosteoarthritis, unspecified: Secondary | ICD-10-CM

## 2021-10-02 NOTE — Progress Notes (Signed)
Complete physical exam   Patient: Rachael Clark   DOB: 08-31-1961   61 y.o. Female  MRN: [id number] Visit Date: 10/09/2021   Chief Complaint  Patient presents with   Annual Exam   Subjective    Rachael Clark is a 61 y.o. female who presents today for a complete physical exam.  She reports consuming a low fat and low sugar  diet.  Does not a routine exercise regimen, reports walks a lot with her job.  She generally feels fairly well. She does have additional problems to discuss today- nasal congestion and ear pressure. Also requesting medication refills. States tried taking meloxicam when needed but her joint pain especially hands was constant and significant so has been taking medication daily.     Past Medical History:  Diagnosis Date   Benign breast lumps    History of kidney stones    Hyperlipidemia    Hyperlipidemia    Kidney stone    Osteopenia    PONV (postoperative nausea and vomiting)    OCCASIONALLY   Vertigo    Past Surgical History:  Procedure Laterality Date   ABDOMINAL HYSTERECTOMY     ABDOMINOPLASTY     APPENDECTOMY     BREAST BIOPSY Bilateral    BREAST LUMPECTOMY Bilateral    CESAREAN SECTION     x 1   CHOLECYSTECTOMY     COLONOSCOPY WITH PROPOFOL N/A 07/22/2019   Procedure: COLONOSCOPY WITH PROPOFOL;  Surgeon: Virgel Manifold, MD;  Location: [facility];  Service: Endoscopy;  Laterality: N/A;   INCONTINENCE SURGERY     TVT   INGUINAL HERNIA REPAIR Right 05/02/2019   Procedure: LAPAROSCOPIC RIGHT INGUINAL HERNIA REPAIR;  Surgeon: Ralene Ok, MD;  Location: [place];  Service: General;  Laterality: Right;   INSERTION OF MESH Right 05/02/2019   Procedure: Insertion Of Mesh;  Surgeon: Ralene Ok, MD;  Location: [place];  Service: General;  Laterality: Right;   KNEE ARTHROSCOPY WITH MEDIAL MENISECTOMY Right 05/09/2021   Procedure: KNEE ARTHROSCOPY WITH MEDIAL MENISECTOMY;  Surgeon: Thornton Park, MD;  Location:  [facility];  Service: Orthopedics;  Laterality: Right;   NOSE SURGERY     OVARIAN CYST REMOVAL     TONSILLECTOMY     TOTAL VAGINAL HYSTERECTOMY  2007   still has her tubes and ovaries   UMBILICAL HERNIA REPAIR     WRIST SURGERY Right    Social History   Socioeconomic History   Marital status: Divorced    Spouse name: Not on file   Number of children: Not on file   Years of education: Not on file   Highest education level: Not on file  Occupational History   Not on file  Tobacco Use   Smoking status: Former    Packs/day: 0.25    Years: 4.00    Pack years: 1.00    Types: Cigarettes    Quit date: 05/02/1996    Years since quitting: 25.4   Smokeless tobacco: Never  Vaping Use   Vaping Use: Never used  Substance and Sexual Activity   Alcohol use: Yes    Alcohol/week: 4.0 standard drinks    Types: 4 Glasses of wine per week   Drug use: No   Sexual activity: Yes    Partners: Male    Birth control/protection: Surgical  Other Topics Concern   Not on file  Social History Narrative   Not on file   Social Determinants of Health   Financial  Resource Strain: Not on file  Food Insecurity: Not on file  Transportation Needs: Not on file  Physical Activity: Not on file  Stress: Not on file  Social Connections: Not on file  Intimate Partner Violence: Not on file     Medications: Outpatient Medications Prior to Visit  Medication Sig   acetaminophen (TYLENOL) 650 MG CR tablet Take 650 mg by mouth every 8 (eight) hours as needed for pain.   Ascorbic Acid (VITAMIN C) 500 MG CAPS Take 500 mg by mouth daily.   Biotin 5 MG CAPS Take 5 mg by mouth daily.   Calcium Carb-Cholecalciferol (CALCIUM 600+D) 600-800 MG-UNIT TABS Take 1 tablet by mouth daily.   Cholecalciferol (VITAMIN D) 50 MCG (2000 UT) CAPS Take 2,000 Units by mouth daily.   Cyanocobalamin (B-12) 5000 MCG CAPS Take 5,000 mcg by mouth daily.   Magnesium 250 MG TABS Take 250 mg by mouth daily.   meclizine (ANTIVERT) 12.5 MG  tablet Take 1 tablet (12.5 mg total) by mouth 3 (three) times daily as needed for dizziness.   Turmeric 450 MG CAPS Take 450 mg by mouth daily.   vitamin E 400 UNIT capsule Take 400 Units by mouth daily. 180 mg   [DISCONTINUED] aspirin EC 325 MG tablet Take 1 tablet (325 mg total) by mouth daily.   [DISCONTINUED] docusate sodium (COLACE) 100 MG capsule Take 1 capsule (100 mg total) by mouth daily as needed.   [DISCONTINUED] HYDROcodone-acetaminophen (NORCO) 5-325 MG tablet Take 1 tablet by mouth every 4 (four) hours as needed for moderate pain.   [DISCONTINUED] Meloxicam 10 MG CAPS TAKE 1 CAPSULE BY MOUTH EVERY DAY AT BEDTIME AS NEEDED   [DISCONTINUED] ondansetron (ZOFRAN) 4 MG tablet Take 1 tablet (4 mg total) by mouth every 8 (eight) hours as needed for nausea or vomiting.   [DISCONTINUED] simvastatin (ZOCOR) 10 MG tablet TAKE 1 TABLET BY MOUTH EVERYDAY AT BEDTIME   No facility-administered medications prior to visit.    Review of Systems Review of Systems:  A fourteen system review of systems was performed and found to be positive as per HPI.    Objective    BP 103/65    Pulse 74    Temp 98 F (36.7 C)    Ht 5\' 2"  (1.575 m)    Wt 135 lb (61.2 kg)    SpO2 98%    BMI 24.69 kg/m    Physical Exam   General Appearance:    Well developed, well nourished female. Alert, cooperative, in no acute distress, appears stated age   Head:    Normocephalic, without obvious abnormality, atraumatic  Eyes:    PERRL, conjunctiva/corneas clear, EOM's intact, fundi    benign, both eyes  Ears:    Normal TM's and external ear canals, both ears  Nose:   Boggy turbinates (left nostril), septum midline, mucosa pale (left nostril), no drainage or sinus tenderness  Throat:   Lips, mucosa, and tongue normal; teeth and gums normal  Neck:   Supple, symmetrical, trachea midline, no adenopathy;    thyroid:  no enlargement/tenderness/nodules; no JVD  Back:     Symmetric, no curvature, ROM normal, no CVA  tenderness  Lungs:     Clear to auscultation bilaterally, respirations unlabored  Chest Wall:    No tenderness or deformity   Heart:    Normal heart rate. Normal rhythm. No murmurs, rubs, or gallops.    Breast Exam:    normal appearance, no masses or tenderness, deferred  Abdomen:  Soft, non-tender, bowel sounds active all four quadrants,    no masses, no organomegaly  Pelvic:    not indicated; status post hysterectomy, negative ROS  Extremities:   All extremities are intact. No cyanosis or edema  Pulses:   2+ and symmetric all extremities  Skin:   Skin color, texture, turgor normal, no rashes or lesions  Lymph nodes:   Cervical, supraclavicular, and axillary nodes normal  Neurologic:   CNII-XII grossly intact     Last depression screening scores PHQ 2/9 Scores 10/09/2021 03/27/2021 09/27/2020  PHQ - 2 Score 0 0 0  PHQ- 9 Score 1 0 0   Last fall risk screening Fall Risk  10/09/2021  Falls in the past year? 0  Number falls in past yr: 0  Injury with Fall? 0  Risk for fall due to : No Fall Risks  Follow up Falls evaluation completed     No results found for any visits on 10/09/21.  Assessment & Plan    Routine Health Maintenance and Physical Exam  Exercise Activities and Dietary recommendations -Discussed heart healthy diet low in fat and carbohydrates. Recommend moderate exercise 150 mins/wk.  Immunization History  Administered Date(s) Administered   Influenza,inj,Quad PF,6+ Mos 07/08/2017, 09/15/2018, 06/10/2019, 07/20/2020, 06/28/2021   PFIZER(Purple Top)SARS-COV-2 Vaccination 01/19/2020, 02/09/2020   Tdap 07/08/2017   Zoster Recombinat (Shingrix) 09/16/2019, 03/08/2020    Health Maintenance  Topic Date Due   COVID-19 Vaccine (3 - Pfizer risk series) 03/08/2020   DEXA SCAN  08/15/2021   MAMMOGRAM  03/22/2023   COLONOSCOPY (Pts 45-89yrs Insurance coverage will need to be confirmed)  07/21/2024   TETANUS/TDAP  07/09/2027   INFLUENZA VACCINE  Completed   Hepatitis C  Screening  Completed   HIV Screening  Completed   Zoster Vaccines- Shingrix  Completed   HPV VACCINES  Aged Out   PAP SMEAR-Modifier  Discontinued    Discussed health benefits of physical activity, and encouraged her to engage in regular exercise appropriate for her age and condition.  Problem List Items Addressed This Visit       Musculoskeletal and Integument   Generalized OA   Relevant Medications   meloxicam (MOBIC) 15 MG tablet   Age related osteoporosis   Relevant Orders   DG Bone Density     Other   Hyperlipidemia (Chronic)   Relevant Medications   simvastatin (ZOCOR) 10 MG tablet   Other Relevant Orders   Lipid panel   CBC with Differential/Platelet   Other Visit Diagnoses     Healthcare maintenance    -  Primary   Relevant Orders   TSH   Lipid panel   Hemoglobin A1c   Comprehensive metabolic panel   CBC with Differential/Platelet   History of prediabetes       Relevant Orders   Hemoglobin A1c   CBC with Differential/Platelet   On statin therapy       Relevant Orders   Lipid panel   Comprehensive metabolic panel   Screening for endocrine, nutritional, metabolic and immunity disorder       Relevant Orders   TSH   Lipid panel   Hemoglobin A1c   Comprehensive metabolic panel   CBC with Differential/Platelet   Allergic rhinitis, unspecified seasonality, unspecified trigger       Relevant Medications   triamcinolone (NASACORT) 55 MCG/ACT AERO nasal inhaler      Patient will come tomorrow morning for fasting labs. Will place order for bone density.  Discussed with patient side effects with  NSAIDs especially with long-term use so recommend to start H2 blocker such as famotidine 20 mg daily for GI protection. Recommend to use Nasacort BID for allergic rhinitis symptoms as well as nasal rinses.   Return in about 6 months (around 04/08/2022) for HLD, prediabetes, OA; lab visit tomorrow for FBW.      Lorrene Reid, PA-C  [facility] at  [facility] [phone] (phone) [phone] (fax)  [place]

## 2021-10-09 ENCOUNTER — Other Ambulatory Visit: Payer: Self-pay

## 2021-10-09 ENCOUNTER — Ambulatory Visit (INDEPENDENT_AMBULATORY_CARE_PROVIDER_SITE_OTHER): Payer: BC Managed Care – PPO | Admitting: Physician Assistant

## 2021-10-09 ENCOUNTER — Encounter: Payer: Self-pay | Admitting: Physician Assistant

## 2021-10-09 VITALS — BP 103/65 | HR 74 | Temp 98.0°F | Ht 62.0 in | Wt 135.0 lb

## 2021-10-09 DIAGNOSIS — Z130 Encounter for screening for diseases of the blood and blood-forming organs and certain disorders involving the immune mechanism: Secondary | ICD-10-CM

## 2021-10-09 DIAGNOSIS — Z1321 Encounter for screening for nutritional disorder: Secondary | ICD-10-CM

## 2021-10-09 DIAGNOSIS — E782 Mixed hyperlipidemia: Secondary | ICD-10-CM | POA: Diagnosis not present

## 2021-10-09 DIAGNOSIS — M810 Age-related osteoporosis without current pathological fracture: Secondary | ICD-10-CM

## 2021-10-09 DIAGNOSIS — Z87898 Personal history of other specified conditions: Secondary | ICD-10-CM

## 2021-10-09 DIAGNOSIS — Z0000 Encounter for general adult medical examination without abnormal findings: Secondary | ICD-10-CM | POA: Diagnosis not present

## 2021-10-09 DIAGNOSIS — Z79899 Other long term (current) drug therapy: Secondary | ICD-10-CM

## 2021-10-09 DIAGNOSIS — Z13228 Encounter for screening for other metabolic disorders: Secondary | ICD-10-CM

## 2021-10-09 DIAGNOSIS — J309 Allergic rhinitis, unspecified: Secondary | ICD-10-CM

## 2021-10-09 DIAGNOSIS — M159 Polyosteoarthritis, unspecified: Secondary | ICD-10-CM

## 2021-10-09 DIAGNOSIS — Z1329 Encounter for screening for other suspected endocrine disorder: Secondary | ICD-10-CM

## 2021-10-09 MED ORDER — MELOXICAM 15 MG PO TABS: 15.0000 mg | ORAL_TABLET | Freq: Every day | ORAL | 1 refills | Status: DC

## 2021-10-09 MED ORDER — SIMVASTATIN 10 MG PO TABS: ORAL_TABLET | ORAL | 1 refills | Status: DC

## 2021-10-09 MED ORDER — TRIAMCINOLONE ACETONIDE 55 MCG/ACT NA AERO: 1.0000 | INHALATION_SPRAY | Freq: Two times a day (BID) | NASAL | 2 refills | Status: DC

## 2021-10-09 NOTE — Patient Instructions (Signed)

## 2021-10-10 ENCOUNTER — Other Ambulatory Visit: Payer: BC Managed Care – PPO

## 2021-10-10 DIAGNOSIS — Z130 Encounter for screening for diseases of the blood and blood-forming organs and certain disorders involving the immune mechanism: Secondary | ICD-10-CM

## 2021-10-10 DIAGNOSIS — Z79899 Other long term (current) drug therapy: Secondary | ICD-10-CM

## 2021-10-10 DIAGNOSIS — Z87898 Personal history of other specified conditions: Secondary | ICD-10-CM

## 2021-10-10 DIAGNOSIS — Z1321 Encounter for screening for nutritional disorder: Secondary | ICD-10-CM

## 2021-10-10 DIAGNOSIS — Z0000 Encounter for general adult medical examination without abnormal findings: Secondary | ICD-10-CM

## 2021-10-10 DIAGNOSIS — E782 Mixed hyperlipidemia: Secondary | ICD-10-CM

## 2021-10-11 LAB — CBC WITH DIFFERENTIAL/PLATELET
Basophils Absolute: 0.1 10*3/uL (ref 0.0–0.2)
Basos: 1 %
EOS (ABSOLUTE): 0.2 10*3/uL (ref 0.0–0.4)
Eos: 4 %
Hematocrit: 38.8 % (ref 34.0–46.6)
Hemoglobin: 13 g/dL (ref 11.1–15.9)
Immature Grans (Abs): 0 10*3/uL (ref 0.0–0.1)
Immature Granulocytes: 0 %
Lymphocytes Absolute: 2.1 10*3/uL (ref 0.7–3.1)
Lymphs: 36 %
MCH: 31.2 pg (ref 26.6–33.0)
MCHC: 33.5 g/dL (ref 31.5–35.7)
MCV: 93 fL (ref 79–97)
Monocytes Absolute: 0.5 10*3/uL (ref 0.1–0.9)
Monocytes: 8 %
Neutrophils Absolute: 3 10*3/uL (ref 1.4–7.0)
Neutrophils: 51 %
Platelets: 234 10*3/uL (ref 150–450)
RBC: 4.17 x10E6/uL (ref 3.77–5.28)
RDW: 12 % (ref 11.7–15.4)
WBC: 5.8 10*3/uL (ref 3.4–10.8)

## 2021-10-11 LAB — COMPREHENSIVE METABOLIC PANEL
ALT: 32 IU/L (ref 0–32)
AST: 23 IU/L (ref 0–40)
Albumin/Globulin Ratio: 2.1 (ref 1.2–2.2)
Albumin: 4.4 g/dL (ref 3.8–4.9)
Alkaline Phosphatase: 70 IU/L (ref 44–121)
BUN/Creatinine Ratio: 25 (ref 12–28)
BUN: 17 mg/dL (ref 8–27)
Bilirubin Total: 1.1 mg/dL (ref 0.0–1.2)
CO2: 23 mmol/L (ref 20–29)
Calcium: 9.6 mg/dL (ref 8.7–10.3)
Chloride: 105 mmol/L (ref 96–106)
Creatinine, Ser: 0.67 mg/dL (ref 0.57–1.00)
Globulin, Total: 2.1 g/dL (ref 1.5–4.5)
Glucose: 98 mg/dL (ref 70–99)
Potassium: 4.6 mmol/L (ref 3.5–5.2)
Sodium: 140 mmol/L (ref 134–144)
Total Protein: 6.5 g/dL (ref 6.0–8.5)
eGFR: 100 mL/min/{1.73_m2} (ref >59)

## 2021-10-11 LAB — LIPID PANEL
Chol/HDL Ratio: 2.5 ratio (ref 0.0–4.4)
Cholesterol, Total: 191 mg/dL (ref 100–199)
HDL: 75 mg/dL (ref >39)
LDL Chol Calc (NIH): 99 mg/dL (ref 0–99)
Triglycerides: 93 mg/dL (ref 0–149)
VLDL Cholesterol Cal: 17 mg/dL (ref 5–40)

## 2021-10-11 LAB — HEMOGLOBIN A1C
Est. average glucose Bld gHb Est-mCnc: 123 mg/dL
Hgb A1c MFr Bld: 5.9 % — ABNORMAL HIGH (ref 4.8–5.6)

## 2021-10-11 LAB — TSH: TSH: 0.659 u[IU]/mL (ref 0.450–4.500)

## 2021-11-15 IMAGING — MG MM DIGITAL SCREENING BILAT W/ TOMO AND CAD
6 of 10 series · 6 of 30 positions shown · non-contrast
Comparison: Previous exam(s).

CLINICAL DATA: Screening.

EXAM:
DIGITAL SCREENING BILATERAL MAMMOGRAM WITH TOMOSYNTHESIS AND CAD
TECHNIQUE: Bilateral screening digital craniocaudal and mediolateral oblique
mammograms were obtained. Bilateral screening digital breast
tomosynthesis was performed. The images were evaluated with
computer-aided detection.

[L CC synth-2D]
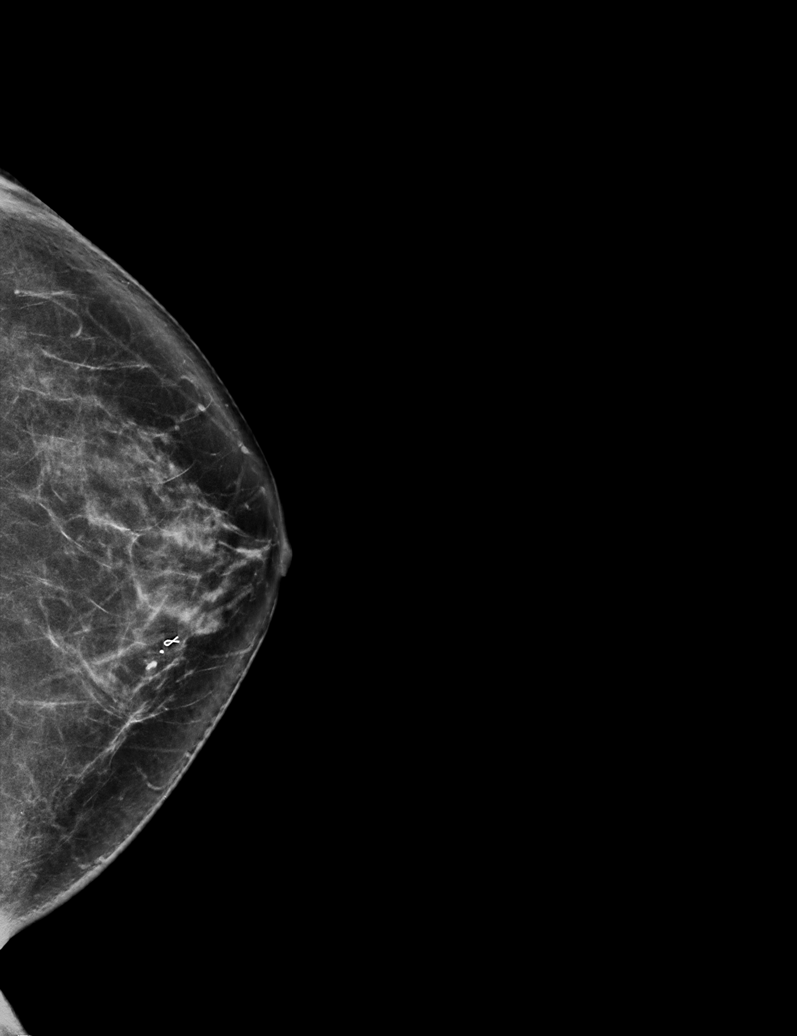

[R MLO synth-2D (1 of 2)]
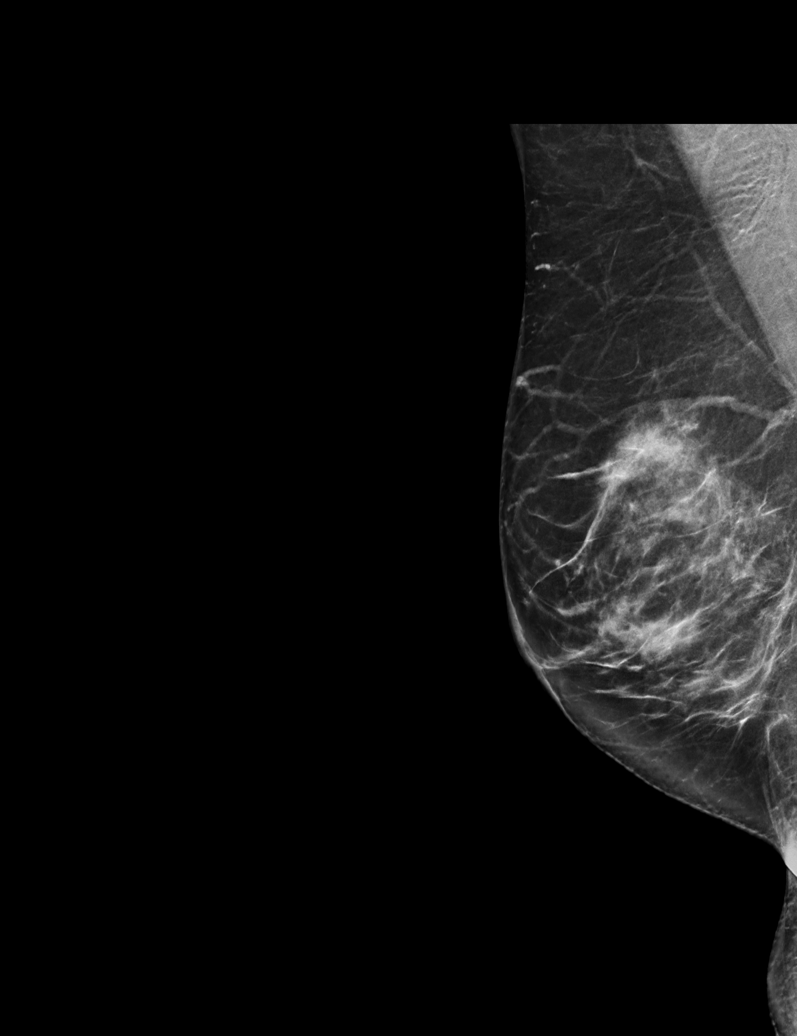

[R MLO synth-2D (2 of 2)]
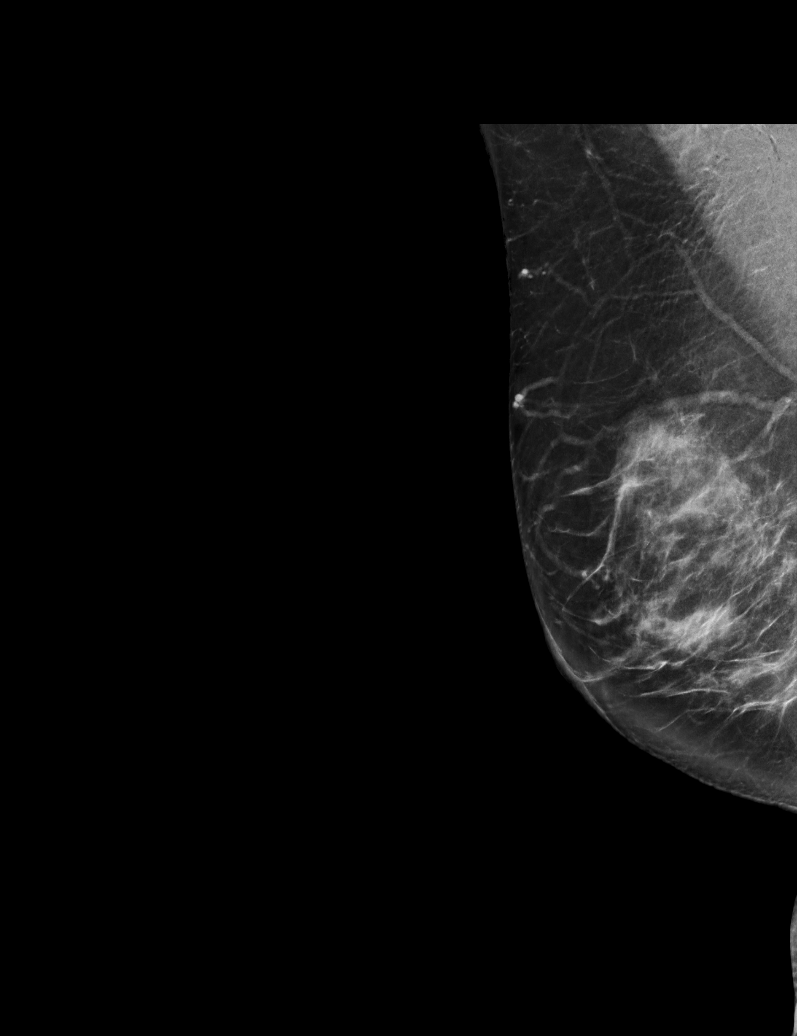

[L MLO synth-2D]
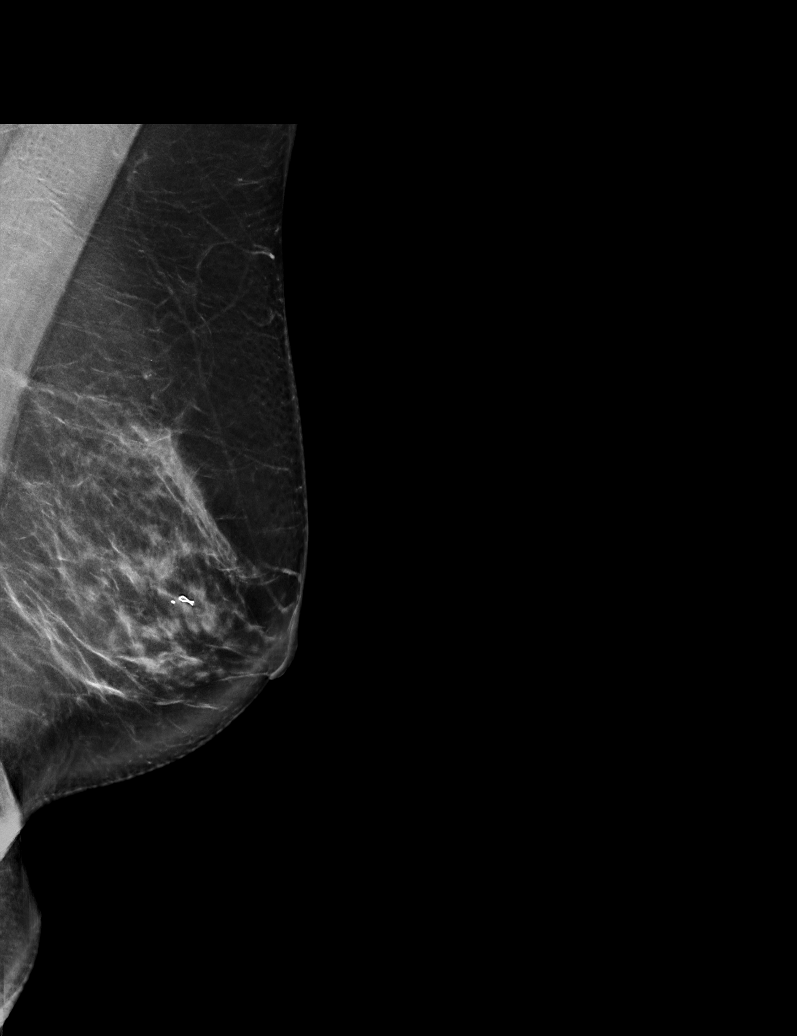

[R CC synth-2D]
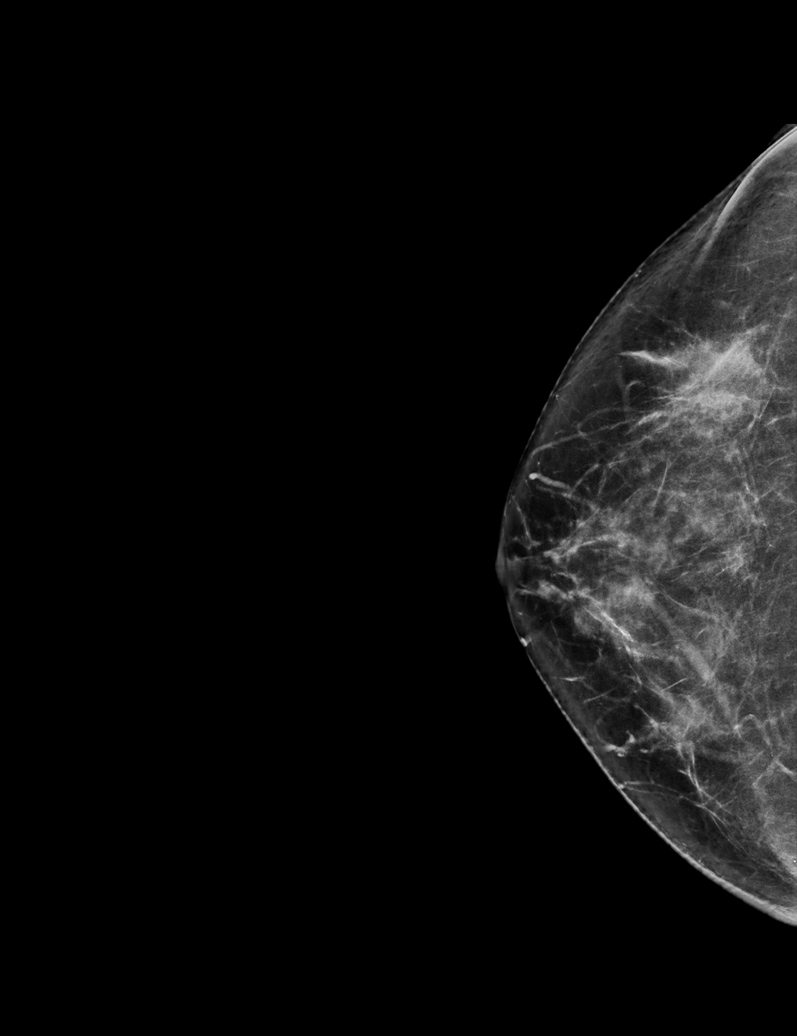

[L CC tomo · tomo slice 35/69.0]
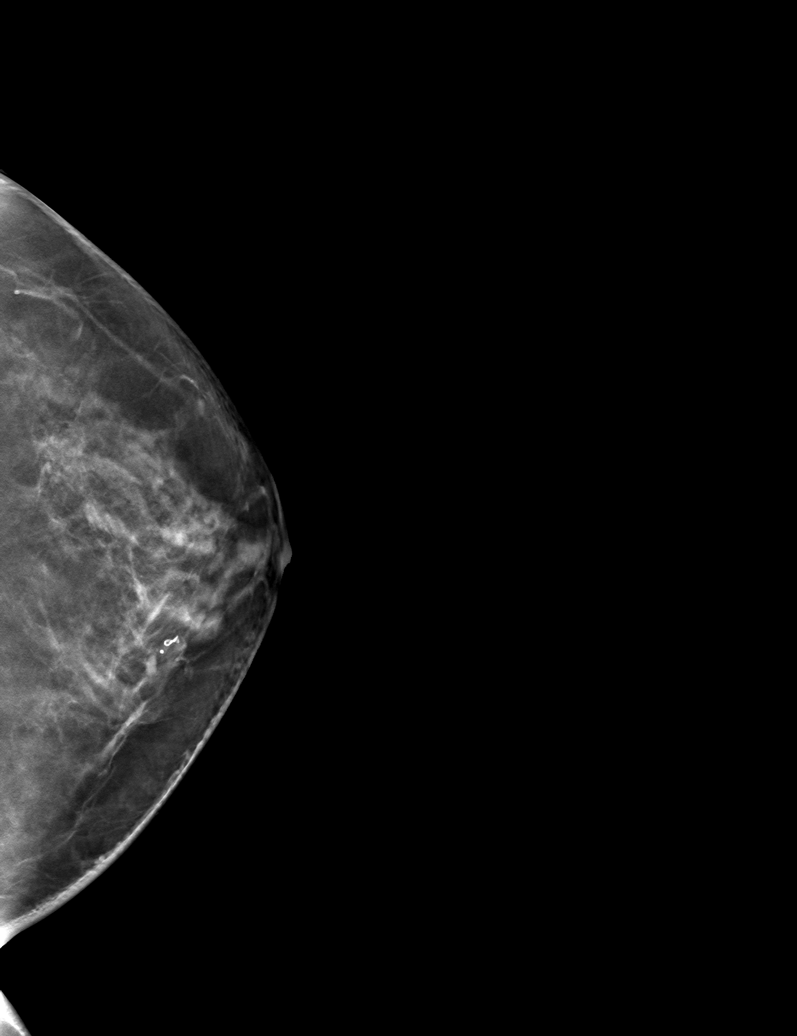

[6 of 30 positions shown; findings below may reference images not displayed]

ACR Breast Density Category c: The breast tissue is heterogeneously
dense, which may obscure small masses.
FINDINGS: There are no findings suspicious for malignancy.
IMPRESSION: No mammographic evidence of malignancy. A result letter of this
screening mammogram will be mailed directly to the patient.

RECOMMENDATION:
Screening mammogram in one year. (Code:Q3-W-BC3)

BI-RADS CATEGORY  1: Negative.

## 2022-03-05 ENCOUNTER — Other Ambulatory Visit: Payer: BC Managed Care – PPO

## 2022-03-20 ENCOUNTER — Other Ambulatory Visit: Payer: Self-pay | Admitting: Physician Assistant

## 2022-03-20 DIAGNOSIS — M159 Polyosteoarthritis, unspecified: Secondary | ICD-10-CM

## 2022-04-10 ENCOUNTER — Other Ambulatory Visit: Payer: BC Managed Care – PPO

## 2022-04-10 ENCOUNTER — Other Ambulatory Visit: Payer: Self-pay

## 2022-04-10 DIAGNOSIS — E782 Mixed hyperlipidemia: Secondary | ICD-10-CM

## 2022-04-10 DIAGNOSIS — Z0000 Encounter for general adult medical examination without abnormal findings: Secondary | ICD-10-CM

## 2022-04-10 DIAGNOSIS — Z87898 Personal history of other specified conditions: Secondary | ICD-10-CM

## 2022-04-10 DIAGNOSIS — Z130 Encounter for screening for diseases of the blood and blood-forming organs and certain disorders involving the immune mechanism: Secondary | ICD-10-CM

## 2022-04-10 DIAGNOSIS — Z79899 Other long term (current) drug therapy: Secondary | ICD-10-CM

## 2022-04-11 ENCOUNTER — Other Ambulatory Visit: Payer: BC Managed Care – PPO

## 2022-04-11 DIAGNOSIS — Z1329 Encounter for screening for other suspected endocrine disorder: Secondary | ICD-10-CM | POA: Diagnosis not present

## 2022-04-11 DIAGNOSIS — Z130 Encounter for screening for diseases of the blood and blood-forming organs and certain disorders involving the immune mechanism: Secondary | ICD-10-CM

## 2022-04-11 DIAGNOSIS — Z13228 Encounter for screening for other metabolic disorders: Secondary | ICD-10-CM | POA: Diagnosis not present

## 2022-04-11 DIAGNOSIS — Z79899 Other long term (current) drug therapy: Secondary | ICD-10-CM

## 2022-04-11 DIAGNOSIS — Z1321 Encounter for screening for nutritional disorder: Secondary | ICD-10-CM | POA: Diagnosis not present

## 2022-04-11 DIAGNOSIS — Z87898 Personal history of other specified conditions: Secondary | ICD-10-CM

## 2022-04-11 DIAGNOSIS — Z0000 Encounter for general adult medical examination without abnormal findings: Secondary | ICD-10-CM

## 2022-04-11 DIAGNOSIS — E782 Mixed hyperlipidemia: Secondary | ICD-10-CM

## 2022-04-12 ENCOUNTER — Other Ambulatory Visit: Payer: Self-pay | Admitting: Physician Assistant

## 2022-04-12 DIAGNOSIS — E782 Mixed hyperlipidemia: Secondary | ICD-10-CM

## 2022-04-12 LAB — LIPID PANEL
Chol/HDL Ratio: 2.2 ratio (ref 0.0–4.4)
Cholesterol, Total: 166 mg/dL (ref 100–199)
HDL: 74 mg/dL (ref >39)
LDL Chol Calc (NIH): 79 mg/dL (ref 0–99)
Triglycerides: 64 mg/dL (ref 0–149)
VLDL Cholesterol Cal: 13 mg/dL (ref 5–40)

## 2022-04-12 LAB — COMPREHENSIVE METABOLIC PANEL
ALT: 34 IU/L — ABNORMAL HIGH (ref 0–32)
AST: 29 IU/L (ref 0–40)
Albumin/Globulin Ratio: 2 (ref 1.2–2.2)
Albumin: 4.3 g/dL (ref 3.9–4.9)
Alkaline Phosphatase: 71 IU/L (ref 44–121)
BUN/Creatinine Ratio: 30 — ABNORMAL HIGH (ref 12–28)
BUN: 17 mg/dL (ref 8–27)
Bilirubin Total: 1 mg/dL (ref 0.0–1.2)
CO2: 22 mmol/L (ref 20–29)
Calcium: 9.2 mg/dL (ref 8.7–10.3)
Chloride: 106 mmol/L (ref 96–106)
Creatinine, Ser: 0.56 mg/dL — ABNORMAL LOW (ref 0.57–1.00)
Globulin, Total: 2.2 g/dL (ref 1.5–4.5)
Glucose: 103 mg/dL — ABNORMAL HIGH (ref 70–99)
Potassium: 4.4 mmol/L (ref 3.5–5.2)
Sodium: 143 mmol/L (ref 134–144)
Total Protein: 6.5 g/dL (ref 6.0–8.5)
eGFR: 104 mL/min/{1.73_m2} (ref >59)

## 2022-04-12 LAB — HEMOGLOBIN A1C
Est. average glucose Bld gHb Est-mCnc: 120 mg/dL
Hgb A1c MFr Bld: 5.8 % — ABNORMAL HIGH (ref 4.8–5.6)

## 2022-04-17 ENCOUNTER — Ambulatory Visit: Payer: BC Managed Care – PPO | Admitting: Physician Assistant

## 2022-04-30 ENCOUNTER — Ambulatory Visit (INDEPENDENT_AMBULATORY_CARE_PROVIDER_SITE_OTHER): Payer: BC Managed Care – PPO | Admitting: Physician Assistant

## 2022-04-30 ENCOUNTER — Encounter: Payer: Self-pay | Admitting: Physician Assistant

## 2022-04-30 VITALS — BP 112/70 | HR 88 | Temp 97.7°F | Ht 62.0 in | Wt 129.0 lb

## 2022-04-30 DIAGNOSIS — Z87898 Personal history of other specified conditions: Secondary | ICD-10-CM

## 2022-04-30 DIAGNOSIS — M159 Polyosteoarthritis, unspecified: Secondary | ICD-10-CM | POA: Diagnosis not present

## 2022-04-30 DIAGNOSIS — E782 Mixed hyperlipidemia: Secondary | ICD-10-CM

## 2022-04-30 DIAGNOSIS — G5603 Carpal tunnel syndrome, bilateral upper limbs: Secondary | ICD-10-CM | POA: Diagnosis not present

## 2022-04-30 NOTE — Assessment & Plan Note (Signed)
-  Discussed with patient recent lipid panel, normal. Discussed recent CMP, ALT mildly elevated and previously has been normal. Will repeat liver function in 6 weeks, if ALT remains elevated recommend medication adjustments. Will continue to monitor.

## 2022-04-30 NOTE — Progress Notes (Signed)
Established patient visit   Patient: Rachael Clark   DOB: 03/13/1961   61 y.o. Female  MRN: [id number] Visit Date: 04/30/2022  Chief Complaint  Patient presents with   Follow-up   Subjective    HPI  Patient presents for chronic follow-up. Patient reports hand tingling seems to be worse. Continues to wear wrist splint at night which so far has been helpful. Continues to take meloxicam 15 mg at bedtime for joint pain (hands).  HLD: Pt taking medication as directed without issues. Patient reports eats mostly home cooked meals. Does not eat fatty foods. Cooks with peanut oil. Limits portions.  ALT: Denies taking acetaminophen or starting new supplements. Denies abdominal pain, nausea or vomiting.   Prediabetes: Patient reports has reduced carbohydrates and limits sugar intake. No increased thirst or urination from baseline.  Medications: Outpatient Medications Prior to Visit  Medication Sig   acetaminophen (TYLENOL) 650 MG CR tablet Take 650 mg by mouth every 8 (eight) hours as needed for pain.   Ascorbic Acid (VITAMIN C) 500 MG CAPS Take 500 mg by mouth daily.   Biotin 5 MG CAPS Take 5 mg by mouth daily.   Calcium Carb-Cholecalciferol (CALCIUM 600+D) 600-800 MG-UNIT TABS Take 1 tablet by mouth daily.   Cholecalciferol (VITAMIN D) 50 MCG (2000 UT) CAPS Take 2,000 Units by mouth daily.   Cyanocobalamin (B-12) 5000 MCG CAPS Take 5,000 mcg by mouth daily.   Magnesium 250 MG TABS Take 250 mg by mouth daily.   meclizine (ANTIVERT) 12.5 MG tablet Take 1 tablet (12.5 mg total) by mouth 3 (three) times daily as needed for dizziness.   meloxicam (MOBIC) 15 MG tablet TAKE 1 TABLET (15 MG TOTAL) BY MOUTH DAILY.   simvastatin (ZOCOR) 10 MG tablet TAKE 1 TABLET BY MOUTH EVERYDAY AT BEDTIME   triamcinolone (NASACORT) 55 MCG/ACT AERO nasal inhaler Place 1 spray into the nose 2 (two) times daily.   Turmeric 450 MG CAPS Take 450 mg by mouth daily.   vitamin E 400 UNIT capsule Take 400  Units by mouth daily. 180 mg   No facility-administered medications prior to visit.    Review of Systems Review of Systems:  A fourteen system review of systems was performed and found to be positive as per HPI.  Last CBC Lab Results  Component Value Date   WBC 5.8 10/10/2021   HGB 13.0 10/10/2021   HCT 38.8 10/10/2021   MCV 93 10/10/2021   MCH 31.2 10/10/2021   RDW 12.0 10/10/2021   PLT 234 53/74/8270   Last metabolic panel Lab Results  Component Value Date   GLUCOSE 103 (H) 04/11/2022   NA 143 04/11/2022   K 4.4 04/11/2022   CL 106 04/11/2022   CO2 22 04/11/2022   BUN 17 04/11/2022   CREATININE 0.56 (L) 04/11/2022   EGFR 104 04/11/2022   CALCIUM 9.2 04/11/2022   PROT 6.5 04/11/2022   ALBUMIN 4.3 04/11/2022   LABGLOB 2.2 04/11/2022   AGRATIO 2.0 04/11/2022   BILITOT 1.0 04/11/2022   ALKPHOS 71 04/11/2022   AST 29 04/11/2022   ALT 34 (H) 04/11/2022   ANIONGAP 8 04/30/2021   Last lipids Lab Results  Component Value Date   CHOL 166 04/11/2022   HDL 74 04/11/2022   LDLCALC 79 04/11/2022   LDLDIRECT 109 (H) 09/15/2018   TRIG 64 04/11/2022   CHOLHDL 2.2 04/11/2022   Last hemoglobin A1c Lab Results  Component Value Date   HGBA1C 5.8 (H) 04/11/2022  Last thyroid functions Lab Results  Component Value Date   TSH 0.659 10/10/2021   Last vitamin D Lab Results  Component Value Date   VD25OH 46.0 03/27/2021       Objective    BP 112/70   Pulse 88   Temp 97.7 F (36.5 C)   Ht _0  (1.575 m)   Wt 129 lb (58.5 kg)   SpO2 98%   BMI 23.59 kg/m  BP Readings from Last 3 Encounters:  04/30/22 112/70  10/09/21 103/65  05/09/21 123/61   Wt Readings from Last 3 Encounters:  04/30/22 129 lb (58.5 kg)  10/09/21 135 lb (61.2 kg)  05/09/21 130 lb (59 kg)    Physical Exam  General:  Well Developed, well nourished, appropriate for stated age.  Neuro:  Alert and oriented,  extra-ocular muscles intact  HEENT:  Normocephalic, atraumatic, neck supple   Skin:  no gross rash, warm, pink. Cardiac:  RRR, S1 S2 Respiratory: CTA B/L  Vascular:  Ext warm, no cyanosis apprec.; cap RF less 2 sec. Psych:  No HI/SI, judgement and insight good, Euthymic mood. Full Affect.   No results found for any visits on 04/30/22.  Assessment & Plan      Problem List Items Addressed This Visit       Nervous and Auditory   Carpal tunnel syndrome     Musculoskeletal and Integument   Generalized OA     Other   Hyperlipidemia - Primary (Chronic)    -Discussed with patient recent lipid panel, normal. Discussed recent CMP, ALT mildly elevated and previously has been normal. Will repeat liver function in 6 weeks, if ALT remains elevated recommend medication adjustments. Will continue to monitor.      Other Visit Diagnoses     History of prediabetes          History of prediabetes: -Discussed recent A1c which has improved from 5.9 to 5.8. Recommend to continue with low carbohydrate/glucose diet.  Carpal tunnel syndrome: -Recommend to continue with night splint. Discussed with patient if tingling worsens recommend to follow-up with hand specialist.  Generalized OA: -Discussed with patient recent CMP, recent creatinine mildly low. Discussed taking meloxicam 15 mg every other day. Recommend to use topical Voltaren gel.   Return in about 6 months (around 10/30/2022) for CPE and FBW; lab visit in 6 weeks for CMP.        Lorrene Reid, PA-C  [facility] [phone] (phone) [phone] (fax)  [place]

## 2022-04-30 NOTE — Patient Instructions (Signed)

## 2022-06-09 ENCOUNTER — Other Ambulatory Visit: Payer: Self-pay

## 2022-06-09 DIAGNOSIS — Z79899 Other long term (current) drug therapy: Secondary | ICD-10-CM

## 2022-06-09 DIAGNOSIS — Z0000 Encounter for general adult medical examination without abnormal findings: Secondary | ICD-10-CM

## 2022-06-09 NOTE — Progress Notes (Signed)
cmp

## 2022-06-11 ENCOUNTER — Other Ambulatory Visit: Payer: BC Managed Care – PPO

## 2022-06-11 DIAGNOSIS — Z79899 Other long term (current) drug therapy: Secondary | ICD-10-CM | POA: Diagnosis not present

## 2022-06-11 DIAGNOSIS — Z0000 Encounter for general adult medical examination without abnormal findings: Secondary | ICD-10-CM

## 2022-06-12 LAB — COMPREHENSIVE METABOLIC PANEL
ALT: 25 IU/L (ref 0–32)
AST: 25 IU/L (ref 0–40)
Albumin/Globulin Ratio: 2.2 (ref 1.2–2.2)
Albumin: 4.4 g/dL (ref 3.9–4.9)
Alkaline Phosphatase: 71 IU/L (ref 44–121)
BUN/Creatinine Ratio: 27 (ref 12–28)
BUN: 17 mg/dL (ref 8–27)
Bilirubin Total: 1 mg/dL (ref 0.0–1.2)
CO2: 23 mmol/L (ref 20–29)
Calcium: 9.6 mg/dL (ref 8.7–10.3)
Chloride: 103 mmol/L (ref 96–106)
Creatinine, Ser: 0.62 mg/dL (ref 0.57–1.00)
Globulin, Total: 2 g/dL (ref 1.5–4.5)
Glucose: 95 mg/dL (ref 70–99)
Potassium: 4.3 mmol/L (ref 3.5–5.2)
Sodium: 140 mmol/L (ref 134–144)
Total Protein: 6.4 g/dL (ref 6.0–8.5)
eGFR: 101 mL/min/{1.73_m2} (ref >59)

## 2022-07-10 ENCOUNTER — Other Ambulatory Visit: Payer: Self-pay | Admitting: Physician Assistant

## 2022-07-10 DIAGNOSIS — E782 Mixed hyperlipidemia: Secondary | ICD-10-CM

## 2022-07-15 ENCOUNTER — Ambulatory Visit (INDEPENDENT_AMBULATORY_CARE_PROVIDER_SITE_OTHER): Payer: BC Managed Care – PPO | Admitting: Physician Assistant

## 2022-07-15 DIAGNOSIS — Z23 Encounter for immunization: Secondary | ICD-10-CM | POA: Diagnosis not present

## 2022-07-15 NOTE — Progress Notes (Signed)
Patient tolerated well. Declined 15 min wait for eval.  

## 2022-09-02 ENCOUNTER — Encounter: Payer: Self-pay | Admitting: Physician Assistant

## 2022-09-02 DIAGNOSIS — Z1231 Encounter for screening mammogram for malignant neoplasm of breast: Secondary | ICD-10-CM

## 2022-09-17 ENCOUNTER — Other Ambulatory Visit: Payer: Self-pay

## 2022-09-17 DIAGNOSIS — M159 Polyosteoarthritis, unspecified: Secondary | ICD-10-CM

## 2022-09-17 MED ORDER — MELOXICAM 15 MG PO TABS: 15.0000 mg | ORAL_TABLET | Freq: Every day | ORAL | 2 refills | Status: DC

## 2022-09-24 ENCOUNTER — Other Ambulatory Visit: Payer: Self-pay | Admitting: Nurse Practitioner

## 2022-09-24 DIAGNOSIS — E782 Mixed hyperlipidemia: Secondary | ICD-10-CM

## 2022-09-24 DIAGNOSIS — Z79899 Other long term (current) drug therapy: Secondary | ICD-10-CM

## 2022-09-24 DIAGNOSIS — Z0000 Encounter for general adult medical examination without abnormal findings: Secondary | ICD-10-CM

## 2022-10-24 ENCOUNTER — Other Ambulatory Visit: Payer: BC Managed Care – PPO

## 2022-10-31 ENCOUNTER — Encounter: Payer: BC Managed Care – PPO | Admitting: Family Medicine

## 2022-11-17 ENCOUNTER — Other Ambulatory Visit: Payer: Self-pay | Admitting: Family Medicine

## 2022-11-17 DIAGNOSIS — Z79899 Other long term (current) drug therapy: Secondary | ICD-10-CM

## 2022-11-17 DIAGNOSIS — Z0000 Encounter for general adult medical examination without abnormal findings: Secondary | ICD-10-CM

## 2022-11-17 DIAGNOSIS — Z87898 Personal history of other specified conditions: Secondary | ICD-10-CM

## 2022-11-17 DIAGNOSIS — E782 Mixed hyperlipidemia: Secondary | ICD-10-CM

## 2022-11-20 ENCOUNTER — Other Ambulatory Visit: Payer: BC Managed Care – PPO

## 2022-11-20 DIAGNOSIS — E782 Mixed hyperlipidemia: Secondary | ICD-10-CM | POA: Diagnosis not present

## 2022-11-20 DIAGNOSIS — Z87898 Personal history of other specified conditions: Secondary | ICD-10-CM

## 2022-11-20 DIAGNOSIS — Z0000 Encounter for general adult medical examination without abnormal findings: Secondary | ICD-10-CM

## 2022-11-20 DIAGNOSIS — Z79899 Other long term (current) drug therapy: Secondary | ICD-10-CM | POA: Diagnosis not present

## 2022-11-21 LAB — CBC WITH DIFFERENTIAL/PLATELET
Basophils Absolute: 0.1 10*3/uL (ref 0.0–0.2)
Basos: 1 %
EOS (ABSOLUTE): 0.6 10*3/uL — ABNORMAL HIGH (ref 0.0–0.4)
Eos: 8 %
Hematocrit: 38.8 % (ref 34.0–46.6)
Hemoglobin: 13 g/dL (ref 11.1–15.9)
Immature Grans (Abs): 0 10*3/uL (ref 0.0–0.1)
Immature Granulocytes: 0 %
Lymphocytes Absolute: 2.4 10*3/uL (ref 0.7–3.1)
Lymphs: 28 %
MCH: 31.7 pg (ref 26.6–33.0)
MCHC: 33.5 g/dL (ref 31.5–35.7)
MCV: 95 fL (ref 79–97)
Monocytes Absolute: 0.5 10*3/uL (ref 0.1–0.9)
Monocytes: 5 %
Neutrophils Absolute: 4.8 10*3/uL (ref 1.4–7.0)
Neutrophils: 58 %
Platelets: 353 10*3/uL (ref 150–450)
RBC: 4.1 x10E6/uL (ref 3.77–5.28)
RDW: 12.3 % (ref 11.7–15.4)
WBC: 8.4 10*3/uL (ref 3.4–10.8)

## 2022-11-21 LAB — COMPREHENSIVE METABOLIC PANEL
ALT: 11 IU/L (ref 0–32)
AST: 15 IU/L (ref 0–40)
Albumin/Globulin Ratio: 1.8 (ref 1.2–2.2)
Albumin: 4.4 g/dL (ref 3.9–4.9)
Alkaline Phosphatase: 90 IU/L (ref 44–121)
BUN/Creatinine Ratio: 23 (ref 12–28)
BUN: 16 mg/dL (ref 8–27)
Bilirubin Total: 0.5 mg/dL (ref 0.0–1.2)
CO2: 24 mmol/L (ref 20–29)
Calcium: 9.5 mg/dL (ref 8.7–10.3)
Chloride: 102 mmol/L (ref 96–106)
Creatinine, Ser: 0.69 mg/dL (ref 0.57–1.00)
Globulin, Total: 2.5 g/dL (ref 1.5–4.5)
Glucose: 97 mg/dL (ref 70–99)
Potassium: 4.6 mmol/L (ref 3.5–5.2)
Sodium: 140 mmol/L (ref 134–144)
Total Protein: 6.9 g/dL (ref 6.0–8.5)
eGFR: 99 mL/min/{1.73_m2} (ref >59)

## 2022-11-21 LAB — HEMOGLOBIN A1C
Est. average glucose Bld gHb Est-mCnc: 126 mg/dL
Hgb A1c MFr Bld: 6 % — ABNORMAL HIGH (ref 4.8–5.6)

## 2022-11-21 LAB — LIPID PANEL
Chol/HDL Ratio: 3.3 ratio (ref 0.0–4.4)
Cholesterol, Total: 235 mg/dL — ABNORMAL HIGH (ref 100–199)
HDL: 71 mg/dL (ref >39)
LDL Chol Calc (NIH): 154 mg/dL — ABNORMAL HIGH (ref 0–99)
Triglycerides: 62 mg/dL (ref 0–149)
VLDL Cholesterol Cal: 10 mg/dL (ref 5–40)

## 2022-11-21 LAB — TSH: TSH: 0.966 u[IU]/mL (ref 0.450–4.500)

## 2022-11-27 ENCOUNTER — Encounter: Payer: Self-pay | Admitting: Family Medicine

## 2022-11-27 ENCOUNTER — Ambulatory Visit (INDEPENDENT_AMBULATORY_CARE_PROVIDER_SITE_OTHER): Payer: BC Managed Care – PPO | Admitting: Family Medicine

## 2022-11-27 VITALS — BP 119/81 | HR 87 | Resp 18 | Ht 62.0 in | Wt 118.0 lb

## 2022-11-27 DIAGNOSIS — G5603 Carpal tunnel syndrome, bilateral upper limbs: Secondary | ICD-10-CM

## 2022-11-27 DIAGNOSIS — R051 Acute cough: Secondary | ICD-10-CM

## 2022-11-27 DIAGNOSIS — R0982 Postnasal drip: Secondary | ICD-10-CM

## 2022-11-27 DIAGNOSIS — Z780 Asymptomatic menopausal state: Secondary | ICD-10-CM

## 2022-11-27 DIAGNOSIS — Z1231 Encounter for screening mammogram for malignant neoplasm of breast: Secondary | ICD-10-CM

## 2022-11-27 DIAGNOSIS — E782 Mixed hyperlipidemia: Secondary | ICD-10-CM

## 2022-11-27 MED ORDER — GUAIFENESIN ER 600 MG PO TB12: 600.0000 mg | ORAL_TABLET | Freq: Two times a day (BID) | ORAL | 0 refills | Status: DC

## 2022-11-27 MED ORDER — BENZONATATE 100 MG PO CAPS: 100.0000 mg | ORAL_CAPSULE | Freq: Three times a day (TID) | ORAL | 0 refills | Status: AC | PRN

## 2022-11-27 MED ORDER — SIMVASTATIN 10 MG PO TABS: 10.0000 mg | ORAL_TABLET | Freq: Every day | ORAL | 1 refills | Status: DC

## 2022-11-27 MED ORDER — BENZONATATE 100 MG PO CAPS: 100.0000 mg | ORAL_CAPSULE | Freq: Three times a day (TID) | ORAL | 0 refills | Status: DC | PRN

## 2022-11-27 NOTE — Assessment & Plan Note (Signed)
Discussed necessity for medication for her cholesterol which likely has some genetic component as she already eats a low-fat diet and is active.  Patient verbalized understanding.  Restart simvastatin 10 mg daily.  Will recheck cholesterol panel in about 2 months.

## 2022-11-27 NOTE — Patient Instructions (Addendum)
Start taking simvastatin for your cholesterol levels.  Continue to do what you have been with your food, making sure to limit greasy, fatty food and sugar/carbs.  Take the guaifenesin to help with mucus.  If that is not enough after taking it for a few days, you can also take the benzonatate to reduce the urge to cough.  Make sure to drink lots and lots of water while you are taking these medicines.  I have provided you some information to read about the RSV vaccine.  You do qualify since you are over 62 years old.  You do not have any chronic conditions that put you at especially high risk, so I think it would be reasonable to get it at any time if you decide you would like to.  I does not urgent that you get it done.

## 2022-11-27 NOTE — Progress Notes (Signed)
Complete physical exam  Patient: Rachael Clark   DOB: August 14, 1961   62 y.o. Female  MRN: [id number]  Subjective:    Chief Complaint  Patient presents with   Annual Exam    Rachael Clark is a 62 y.o. female who presents today for a complete physical exam. She reports consuming a low fat diet.  The patient walks a time at work at [employer] and is rarely sitting down, she stays very active.  She generally feels well. She reports sleeping well. She does not have additional problems to discuss today.  She did take herself off of all medications and supplements.  Since then, she did see that her cholesterol levels and A1c increased on labs.  She did also have a recent trip with family to [place] for 23 days which could contribute to the A1c increased as well.   Most recent fall risk assessment:    11/27/2022    1:18 PM  [place] in the past year? 0  Number falls in past yr: 0  Injury with Fall? 0  Risk for fall due to : No Fall Risks     Most recent depression screenings:    11/27/2022    1:17 PM 04/30/2022    2:35 PM  PHQ 2/9 Scores  PHQ - 2 Score 0 0  PHQ- 9 Score 1 0    Patient Active Problem List   Diagnosis Date Noted   Chronic constipation 09/16/2019   Internal hemorrhoids without complication 99991111   Polyp of transverse colon    Generalized OA 01/05/2018   Elevated hemoglobin A1c measurement 01/05/2018   Estradiol deficiency 09/10/2017   Dense breast tissue 09/03/2017   Carpal tunnel syndrome, bilateral 03/25/2017   Vaginal dryness, menopausal 03/25/2017   Rheumatoid arthritis involving both hands ([place]) 01/22/2017   Chronic left shoulder pain 01/22/2017   Chronic shoulder bursitis, left 01/22/2017   Hyperlipidemia 08/27/2016   Vaginal atrophy 08/27/2016   Osteopenia after menopause 08/27/2016    Past Surgical History:  Procedure Laterality Date   ABDOMINAL HYSTERECTOMY     ABDOMINOPLASTY     APPENDECTOMY     BREAST  BIOPSY Bilateral    BREAST LUMPECTOMY Bilateral    CESAREAN SECTION     x 1   CHOLECYSTECTOMY     COLONOSCOPY WITH PROPOFOL N/A 07/22/2019   Procedure: COLONOSCOPY WITH PROPOFOL;  Surgeon: Virgel Manifold, MD;  Location: [facility];  Service: Endoscopy;  Laterality: N/A;   INCONTINENCE SURGERY     TVT   INGUINAL HERNIA REPAIR Right 05/02/2019   Procedure: LAPAROSCOPIC RIGHT INGUINAL HERNIA REPAIR;  Surgeon: Ralene Ok, MD;  Location: [facility];  Service: General;  Laterality: Right;   INSERTION OF MESH Right 05/02/2019   Procedure: Insertion Of Mesh;  Surgeon: Ralene Ok, MD;  Location: [facility];  Service: General;  Laterality: Right;   KNEE ARTHROSCOPY WITH MEDIAL MENISECTOMY Right 05/09/2021   Procedure: KNEE ARTHROSCOPY WITH MEDIAL MENISECTOMY;  Surgeon: Thornton Park, MD;  Location: [facility];  Service: Orthopedics;  Laterality: Right;   NOSE SURGERY     OVARIAN CYST REMOVAL     TONSILLECTOMY     TOTAL VAGINAL HYSTERECTOMY  2007   still has her tubes and ovaries   UMBILICAL HERNIA REPAIR     WRIST SURGERY Right    Social History   Tobacco Use   Smoking status: Former    Packs/day: 0.25    Years: 4.00  Additional pack years: 0.00    Total pack years: 1.00    Types: Cigarettes    Quit date: 05/02/1996    Years since quitting: 26.5   Smokeless tobacco: Never  Vaping Use   Vaping Use: Never used  Substance Use Topics   Alcohol use: Yes    Alcohol/week: 4.0 standard drinks of alcohol    Types: 4 Glasses of wine per week   Drug use: No   Family History  Problem Relation Age of Onset   Osteoporosis Mother    Dementia Mother    Osteopenia Mother    Healthy Father    Healthy Sister    Healthy Brother    Healthy Daughter    Healthy Son    Healthy Sister    Healthy Sister    Healthy Brother    Healthy Son    Uterine cancer Maternal Grandmother    No Known Allergies   Patient Care Team: Rachael Harman, PA as PCP - General (Family Medicine)    Outpatient Medications Prior to Visit  Medication Sig   [DISCONTINUED] acetaminophen (TYLENOL) 650 MG CR tablet Take 650 mg by mouth every 8 (eight) hours as needed for pain. (Patient not taking: Reported on 11/27/2022)   [DISCONTINUED] Ascorbic Acid (VITAMIN C) 500 MG CAPS Take 500 mg by mouth daily. (Patient not taking: Reported on 11/27/2022)   [DISCONTINUED] Biotin 5 MG CAPS Take 5 mg by mouth daily. (Patient not taking: Reported on 11/27/2022)   [DISCONTINUED] Calcium Carb-Cholecalciferol (CALCIUM 600+D) 600-800 MG-UNIT TABS Take 1 tablet by mouth daily. (Patient not taking: Reported on 11/27/2022)   [DISCONTINUED] Cholecalciferol (VITAMIN D) 50 MCG (2000 UT) CAPS Take 2,000 Units by mouth daily. (Patient not taking: Reported on 11/27/2022)   [DISCONTINUED] Cyanocobalamin (B-12) 5000 MCG CAPS Take 5,000 mcg by mouth daily. (Patient not taking: Reported on 11/27/2022)   [DISCONTINUED] Magnesium 250 MG TABS Take 250 mg by mouth daily. (Patient not taking: Reported on 11/27/2022)   [DISCONTINUED] meclizine (ANTIVERT) 12.5 MG tablet Take 1 tablet (12.5 mg total) by mouth 3 (three) times daily as needed for dizziness. (Patient not taking: Reported on 11/27/2022)   [DISCONTINUED] meloxicam (MOBIC) 15 MG tablet Take 1 tablet (15 mg total) by mouth daily. (Patient not taking: Reported on 11/27/2022)   [DISCONTINUED] simvastatin (ZOCOR) 10 MG tablet TAKE 1 TABLET BY MOUTH EVERYDAY AT BEDTIME (Patient not taking: Reported on 11/27/2022)   [DISCONTINUED] triamcinolone (NASACORT) 55 MCG/ACT AERO nasal inhaler Place 1 spray into the nose 2 (two) times daily. (Patient not taking: Reported on 11/27/2022)   [DISCONTINUED] Turmeric 450 MG CAPS Take 450 mg by mouth daily. (Patient not taking: Reported on 11/27/2022)   [DISCONTINUED] vitamin E 400 UNIT capsule Take 400 Units by mouth daily. 180 mg (Patient not taking: Reported on 11/27/2022)   No facility-administered medications prior to visit.    Review of Systems   Constitutional:  Negative for chills, fever and malaise/fatigue.  HENT:  Negative for congestion and hearing loss.   Eyes:  Negative for blurred vision and double vision.  Respiratory:  Positive for cough (Since getting back from a trip to [place]). Negative for shortness of breath.   Cardiovascular:  Negative for chest pain, palpitations and leg swelling.  Gastrointestinal:  Negative for abdominal pain, constipation, diarrhea and heartburn.  Genitourinary:  Negative for frequency and urgency.  Musculoskeletal:  Negative for myalgias and neck pain.  Neurological:  Negative for headaches.  Endo/Heme/Allergies:  Negative for polydipsia.  Psychiatric/Behavioral:  Negative for depression.  The patient is not nervous/anxious.        Objective:    BP 119/81 (BP Location: Right Arm, Patient Position: Sitting, Cuff Size: Normal)   Pulse 87   Resp 18   Ht 5\' 2"  (1.575 m)   Wt 118 lb (53.5 kg)   SpO2 99%   BMI 21.58 kg/m    Physical Exam Constitutional:      General: She is not in acute distress.    Appearance: Normal appearance.  HENT:     Head: Normocephalic and atraumatic.     Right Ear: Tympanic membrane and ear canal normal.     Left Ear: Tympanic membrane and ear canal normal.     Nose: Nose normal.     Mouth/Throat:     Mouth: Mucous membranes are moist.     Pharynx: No oropharyngeal exudate or posterior oropharyngeal erythema.     Comments: Postnasal drip Eyes:     Extraocular Movements: Extraocular movements intact.     Pupils: Pupils are equal, round, and reactive to light.  Cardiovascular:     Rate and Rhythm: Normal rate and regular rhythm.     Heart sounds: Normal heart sounds.  Pulmonary:     Effort: Pulmonary effort is normal.     Breath sounds: Normal breath sounds.  Abdominal:     General: Abdomen is flat. Bowel sounds are normal.  Musculoskeletal:        General: Normal range of motion.     Cervical back: Normal range of motion and neck supple.  Skin:     General: Skin is warm and dry.  Neurological:     Mental Status: She is alert and oriented to person, place, and time.  Psychiatric:        Mood and Affect: Mood normal.       Assessment & Plan:    Routine Health Maintenance and Physical Exam  Immunization History  Administered Date(s) Administered   Influenza,inj,Quad PF,6+ Mos 07/08/2017, 09/15/2018, 06/10/2019, 07/20/2020, 06/28/2021, 07/15/2022   PFIZER(Purple Top)SARS-COV-2 Vaccination 01/19/2020, 02/09/2020   Tdap 07/08/2017   Zoster Recombinat (Shingrix) 09/16/2019, 03/08/2020    Health Maintenance  Topic Date Due   COVID-19 Vaccine (3 - Pfizer risk series) 03/08/2020   DEXA SCAN  08/15/2021   MAMMOGRAM  03/22/2023   COLONOSCOPY (Pts 45-47yrs Insurance coverage will need to be confirmed)  07/21/2024   DTaP/Tdap/Td (2 - Td or Tdap) 07/09/2027   INFLUENZA VACCINE  Completed   Hepatitis C Screening  Completed   HIV Screening  Completed   Zoster Vaccines- Shingrix  Completed   HPV VACCINES  Aged Out   PAP SMEAR-Modifier  Discontinued   Reviewed lab work, including increase in A1c and cholesterol levels.  Recommended continuing to limit fats and reduce carbs/sugary foods.  Discussed health benefits of physical activity, and encouraged her to engage in regular exercise appropriate for her age and condition.  Mixed hyperlipidemia -     Simvastatin; Take 1 tablet (10 mg total) by mouth daily at 6 PM. For cholesterol.  Dispense: 90 tablet; Refill: 1  Postnasal drip -     guaiFENesin ER; Take 1 tablet (600 mg total) by mouth 2 (two) times daily. For mucus. Drink lots of water.  Dispense: 20 tablet; Refill: 0 -     Benzonatate; Take 1 capsule (100 mg total) by mouth 3 (three) times daily as needed for up to 7 days for cough.  Dispense: 21 capsule; Refill: 0  Acute cough -  guaiFENesin ER; Take 1 tablet (600 mg total) by mouth 2 (two) times daily. For mucus. Drink lots of water.  Dispense: 20 tablet; Refill: 0 -      Benzonatate; Take 1 capsule (100 mg total) by mouth 3 (three) times daily as needed for up to 7 days for cough.  Dispense: 21 capsule; Refill: 0  Postmenopausal estrogen deficiency -     DG Bone Density; Future  Screening mammogram for breast cancer -     Digital Screening Mammogram, Left and Right; Future  Carpal tunnel syndrome, bilateral -     Ambulatory referral to Orthopedic Surgery  Sent referrals for mammogram and bone density scan to [facility] area per patient request.  Also sent referral to orthopedic surgery for reevaluation of carpal tunnel syndrome as it has worsened and she has not been seen since 2022.  Return in about 2 months (around 01/27/2023) for follow-up, fasting blood work 1 week before.     Rachael Harman, PA

## 2022-12-31 ENCOUNTER — Other Ambulatory Visit: Payer: Self-pay | Admitting: Family Medicine

## 2022-12-31 ENCOUNTER — Telehealth: Payer: Self-pay | Admitting: *Deleted

## 2022-12-31 DIAGNOSIS — Z79899 Other long term (current) drug therapy: Secondary | ICD-10-CM

## 2022-12-31 DIAGNOSIS — Z0000 Encounter for general adult medical examination without abnormal findings: Secondary | ICD-10-CM

## 2022-12-31 DIAGNOSIS — E782 Mixed hyperlipidemia: Secondary | ICD-10-CM

## 2022-12-31 NOTE — Telephone Encounter (Signed)
Pt calling to get name and number of office in [place] so she can get her bone density

## 2023-01-02 ENCOUNTER — Other Ambulatory Visit: Payer: Self-pay | Admitting: Family Medicine

## 2023-01-02 DIAGNOSIS — Z1231 Encounter for screening mammogram for malignant neoplasm of breast: Secondary | ICD-10-CM

## 2023-01-23 ENCOUNTER — Other Ambulatory Visit: Payer: BLUE CROSS/BLUE SHIELD

## 2023-01-23 DIAGNOSIS — Z79899 Other long term (current) drug therapy: Secondary | ICD-10-CM | POA: Diagnosis not present

## 2023-01-23 DIAGNOSIS — E782 Mixed hyperlipidemia: Secondary | ICD-10-CM

## 2023-01-23 DIAGNOSIS — Z0000 Encounter for general adult medical examination without abnormal findings: Secondary | ICD-10-CM | POA: Diagnosis not present

## 2023-01-24 LAB — COMPREHENSIVE METABOLIC PANEL
ALT: 17 IU/L (ref 0–32)
AST: 20 IU/L (ref 0–40)
Albumin/Globulin Ratio: 1.8 (ref 1.2–2.2)
Albumin: 4.3 g/dL (ref 3.9–4.9)
Alkaline Phosphatase: 82 IU/L (ref 44–121)
BUN/Creatinine Ratio: 23 (ref 12–28)
BUN: 14 mg/dL (ref 8–27)
Bilirubin Total: 1.1 mg/dL (ref 0.0–1.2)
CO2: 24 mmol/L (ref 20–29)
Calcium: 9.6 mg/dL (ref 8.7–10.3)
Chloride: 105 mmol/L (ref 96–106)
Creatinine, Ser: 0.6 mg/dL (ref 0.57–1.00)
Globulin, Total: 2.4 g/dL (ref 1.5–4.5)
Glucose: 96 mg/dL (ref 70–99)
Potassium: 4.8 mmol/L (ref 3.5–5.2)
Sodium: 142 mmol/L (ref 134–144)
Total Protein: 6.7 g/dL (ref 6.0–8.5)
eGFR: 102 mL/min/{1.73_m2} (ref >59)

## 2023-01-24 LAB — CBC WITH DIFFERENTIAL/PLATELET
Basophils Absolute: 0.1 10*3/uL (ref 0.0–0.2)
Basos: 1 %
EOS (ABSOLUTE): 0.6 10*3/uL — ABNORMAL HIGH (ref 0.0–0.4)
Eos: 7 %
Hematocrit: 39.6 % (ref 34.0–46.6)
Hemoglobin: 13.1 g/dL (ref 11.1–15.9)
Immature Grans (Abs): 0 10*3/uL (ref 0.0–0.1)
Immature Granulocytes: 0 %
Lymphocytes Absolute: 1.9 10*3/uL (ref 0.7–3.1)
Lymphs: 23 %
MCH: 31.3 pg (ref 26.6–33.0)
MCHC: 33.1 g/dL (ref 31.5–35.7)
MCV: 95 fL (ref 79–97)
Monocytes Absolute: 0.4 10*3/uL (ref 0.1–0.9)
Monocytes: 5 %
Neutrophils Absolute: 5.1 10*3/uL (ref 1.4–7.0)
Neutrophils: 64 %
Platelets: 264 10*3/uL (ref 150–450)
RBC: 4.19 x10E6/uL (ref 3.77–5.28)
RDW: 12.2 % (ref 11.7–15.4)
WBC: 8.1 10*3/uL (ref 3.4–10.8)

## 2023-01-24 LAB — LIPID PANEL
Chol/HDL Ratio: 2.2 ratio (ref 0.0–4.4)
Cholesterol, Total: 189 mg/dL (ref 100–199)
HDL: 85 mg/dL (ref >39)
LDL Chol Calc (NIH): 93 mg/dL (ref 0–99)
Triglycerides: 57 mg/dL (ref 0–149)
VLDL Cholesterol Cal: 11 mg/dL (ref 5–40)

## 2023-01-27 ENCOUNTER — Ambulatory Visit
Admission: RE | Admit: 2023-01-27 | Discharge: 2023-01-27 | Disposition: A | Discharge status: Home / Self Care | Payer: BLUE CROSS/BLUE SHIELD | Source: Ambulatory Visit | Attending: Family Medicine | Admitting: Family Medicine

## 2023-01-27 ENCOUNTER — Ambulatory Visit
Admission: RE | Admit: 2023-01-27 | Discharge: 2023-01-27 | Discharge status: Home / Self Care | Payer: BLUE CROSS/BLUE SHIELD | Source: Ambulatory Visit | Attending: Family Medicine

## 2023-01-27 DIAGNOSIS — Z1231 Encounter for screening mammogram for malignant neoplasm of breast: Secondary | ICD-10-CM

## 2023-01-27 DIAGNOSIS — Z780 Asymptomatic menopausal state: Secondary | ICD-10-CM | POA: Insufficient documentation

## 2023-01-27 DIAGNOSIS — M8589 Other specified disorders of bone density and structure, multiple sites: Secondary | ICD-10-CM | POA: Diagnosis not present

## 2023-01-28 ENCOUNTER — Ambulatory Visit (INDEPENDENT_AMBULATORY_CARE_PROVIDER_SITE_OTHER): Payer: BLUE CROSS/BLUE SHIELD | Admitting: Family Medicine

## 2023-01-28 ENCOUNTER — Encounter: Payer: Self-pay | Admitting: Family Medicine

## 2023-01-28 VITALS — BP 117/75 | HR 70 | Resp 18 | Ht 62.0 in | Wt 118.0 lb

## 2023-01-28 DIAGNOSIS — E782 Mixed hyperlipidemia: Secondary | ICD-10-CM | POA: Diagnosis not present

## 2023-01-28 DIAGNOSIS — Z780 Asymptomatic menopausal state: Secondary | ICD-10-CM

## 2023-01-28 DIAGNOSIS — M8580 Other specified disorders of bone density and structure, unspecified site: Secondary | ICD-10-CM

## 2023-01-28 DIAGNOSIS — J302 Other seasonal allergic rhinitis: Secondary | ICD-10-CM | POA: Diagnosis not present

## 2023-01-28 MED ORDER — ALENDRONATE SODIUM 70 MG PO TABS
70.0000 mg | ORAL_TABLET | ORAL | 11 refills | Status: DC
Start: 2023-01-28 — End: 2023-12-22

## 2023-01-28 NOTE — Patient Instructions (Addendum)
CONTINUE simvastatin 10 mg daily. START your allergy medicine daily. START vitamin D 2000 units daily.  START alendronate 70 mg weekly.

## 2023-01-28 NOTE — Assessment & Plan Note (Signed)
We discussed that allergy medicine is safe and recommended to take once daily, especially during allergy season.  Patient in agreement to start taking her OTC allergy medicine daily and let me know if this is not enough to relieve her symptoms.

## 2023-01-28 NOTE — Progress Notes (Signed)
Established Patient Office Visit  Subjective   Patient ID: Rachael Clark, female    DOB: 1961/01/31  Age: 62 y.o. MRN: [id number]  Chief Complaint  Patient presents with   Hyperlipidemia   Sinusitis    HPI Rachael Clark is a 62 y.o. female presenting today for follow up of hyperlipidemia.  Since her last appointment, she has also had her bone mineral density scan which revealed T-score of -2.3 and 10.4% 10-year risk of hip fracture. Hyperlipidemia: tolerating simvastatin well with no myalgias or significant side effects.  This was restarted at her last appointment after cholesterol levels were revealed to have increased dramatically since discontinuing simvastatin.  Currently consuming a low fat diet.  The 10-year ASCVD risk score (Arnett DK, et al., 2019) is: 2.3% She also complains of postnasal drip, nasal congestion, and sinus pressure.  This is typically worse at night.  She does have some allergy medicine that she has taken occasionally which seems to relieve her symptoms.  ROS Negative unless otherwise noted in HPI   Objective:     BP 117/75 (BP Location: Left Arm, Patient Position: Sitting, Cuff Size: Normal)   Pulse 70   Resp 18   Ht 5\' 2"  (1.575 m)   Wt 118 lb (53.5 kg)   SpO2 99%   BMI 21.58 kg/m   Physical Exam Constitutional:      General: She is not in acute distress.    Appearance: Normal appearance. She is not ill-appearing.  HENT:     Head: Normocephalic and atraumatic.     Right Ear: Tympanic membrane, ear canal and external ear normal. There is no impacted cerumen.     Left Ear: Tympanic membrane, ear canal and external ear normal. There is no impacted cerumen.     Nose: No congestion or rhinorrhea.     Right Sinus: No maxillary sinus tenderness.     Left Sinus: No maxillary sinus tenderness.     Comments: Mild ethmoid tenderness    Mouth/Throat:     Mouth: Mucous membranes are moist.     Pharynx: Oropharynx is clear. No  oropharyngeal exudate or posterior oropharyngeal erythema.  Eyes:     General:        Right eye: No discharge.        Left eye: No discharge.     Conjunctiva/sclera: Conjunctivae normal.     Pupils: Pupils are equal, round, and reactive to light.  Cardiovascular:     Rate and Rhythm: Normal rate and regular rhythm.     Heart sounds: No murmur heard.    No friction rub. No gallop.  Pulmonary:     Effort: Pulmonary effort is normal. No respiratory distress.     Breath sounds: Normal breath sounds. No wheezing, rhonchi or rales.  Skin:    General: Skin is warm and dry.  Neurological:     Mental Status: She is alert and oriented to person, place, and time.     Assessment & Plan:  Mixed hyperlipidemia Assessment & Plan: Last lipid panel: LDL 93, HDL 85, triglycerides 57.  Congratulated patient on improvement particularly in LDL.  Encouraged her to continue with low-fat diet and routine physical activity.  Continue simvastatin 10 mg daily.   Osteopenia after menopause Assessment & Plan: In the past, was on alendronate 70 mg weekly.  She did also previously take calcium and vitamin D supplements.  She discontinued calcium supplements because she got recurrent kidney stones. We discussed  her recent bone mineral density results.  It will be key to maintain vitamin D and calcium levels.  She is at a high enough risk that restarting alendronate would also be beneficial.  Patient verbalized understanding and is agreeable to this plan.  Start alendronate 70 mg weekly, OTC vitamin D3 2000 units daily.  We will continue to monitor calcium levels and adjust management as necessary.  Orders: -     Alendronate Sodium; Take 1 tablet (70 mg total) by mouth every 7 (seven) days. Take with a full glass of water on an empty stomach.  Dispense: 4 tablet; Refill: 11  Seasonal allergies Assessment & Plan: We discussed that allergy medicine is safe and recommended to take once daily, especially during  allergy season.  Patient in agreement to start taking her OTC allergy medicine daily and let me know if this is not enough to relieve her symptoms.     Return in about 4 months (around 05/31/2023) for follow-up for osteoporosis and HLD, fasting blood work 1 week before.    Melida Quitter, PA

## 2023-01-28 NOTE — Assessment & Plan Note (Addendum)
In the past, was on alendronate 70 mg weekly.  She did also previously take calcium and vitamin D supplements.  She discontinued calcium supplements because she got recurrent kidney stones. We discussed her recent bone mineral density results.  It will be key to maintain vitamin D and calcium levels.  She is at a high enough risk that restarting alendronate would also be beneficial.  Patient verbalized understanding and is agreeable to this plan.  Start alendronate 70 mg weekly, OTC vitamin D3 2000 units daily.  We will continue to monitor calcium levels and adjust management as necessary.

## 2023-01-28 NOTE — Assessment & Plan Note (Signed)
Last lipid panel: LDL 93, HDL 85, triglycerides 57.  Congratulated patient on improvement particularly in LDL.  Encouraged her to continue with low-fat diet and routine physical activity.  Continue simvastatin 10 mg daily.

## 2023-02-17 DIAGNOSIS — G5603 Carpal tunnel syndrome, bilateral upper limbs: Secondary | ICD-10-CM | POA: Diagnosis not present

## 2023-03-17 ENCOUNTER — Other Ambulatory Visit: Payer: Self-pay | Admitting: Family Medicine

## 2023-03-17 DIAGNOSIS — E782 Mixed hyperlipidemia: Secondary | ICD-10-CM

## 2023-04-06 ENCOUNTER — Ambulatory Visit
Admission: RE | Admit: 2023-04-06 | Discharge: 2023-04-06 | Disposition: A | Discharge status: Home / Self Care | Payer: BC Managed Care – PPO | Source: Ambulatory Visit | Attending: Physician Assistant | Admitting: Physician Assistant

## 2023-04-06 ENCOUNTER — Other Ambulatory Visit: Payer: Self-pay | Admitting: Physician Assistant

## 2023-04-06 DIAGNOSIS — R52 Pain, unspecified: Secondary | ICD-10-CM | POA: Diagnosis not present

## 2023-04-09 DIAGNOSIS — G5603 Carpal tunnel syndrome, bilateral upper limbs: Secondary | ICD-10-CM | POA: Diagnosis not present

## 2023-04-20 ENCOUNTER — Telehealth: Payer: Self-pay

## 2023-04-20 DIAGNOSIS — Z20822 Contact with and (suspected) exposure to covid-19: Secondary | ICD-10-CM | POA: Diagnosis not present

## 2023-04-20 DIAGNOSIS — U071 COVID-19: Secondary | ICD-10-CM | POA: Diagnosis not present

## 2023-04-20 NOTE — Telephone Encounter (Signed)
Pt states that symptoms Chills, ear pain, sore throat, a lot of mucus all start on Saturday. Pt took at home COVID test and was POS. Pt have tried OTC medication. Pt is requesting something that is Liquid.  Pharmacy is [facility]

## 2023-04-20 NOTE — Telephone Encounter (Signed)
The medication that is typically used to treat COVID to decrease the length of symptoms is Paxlovid, but it is not available in liquid form.  I would encourage her to continue with ibuprofen or Tylenol for the sore throat and pain, she can take Mucinex to break up the mucus but drink lots of water while doing so.  If she would like to try the Paxlovid even though it is not a liquid, I can send it in.  Just let me know!

## 2023-05-05 DIAGNOSIS — G5603 Carpal tunnel syndrome, bilateral upper limbs: Secondary | ICD-10-CM | POA: Diagnosis not present

## 2023-05-25 ENCOUNTER — Other Ambulatory Visit: Payer: Self-pay | Admitting: Family Medicine

## 2023-05-25 DIAGNOSIS — D721 Eosinophilia, unspecified: Secondary | ICD-10-CM

## 2023-05-25 DIAGNOSIS — R7309 Other abnormal glucose: Secondary | ICD-10-CM

## 2023-05-25 DIAGNOSIS — Z87898 Personal history of other specified conditions: Secondary | ICD-10-CM

## 2023-05-27 ENCOUNTER — Other Ambulatory Visit: Payer: BC Managed Care – PPO

## 2023-05-27 DIAGNOSIS — R7309 Other abnormal glucose: Secondary | ICD-10-CM

## 2023-05-27 DIAGNOSIS — D721 Eosinophilia, unspecified: Secondary | ICD-10-CM

## 2023-05-27 DIAGNOSIS — Z87898 Personal history of other specified conditions: Secondary | ICD-10-CM

## 2023-05-28 ENCOUNTER — Other Ambulatory Visit: Payer: BLUE CROSS/BLUE SHIELD

## 2023-05-28 LAB — CBC WITH DIFFERENTIAL/PLATELET
Basophils Absolute: 0.1 10*3/uL (ref 0.0–0.2)
Basos: 1 %
EOS (ABSOLUTE): 0.5 10*3/uL — ABNORMAL HIGH (ref 0.0–0.4)
Eos: 7 %
Hematocrit: 38.6 % (ref 34.0–46.6)
Hemoglobin: 12.7 g/dL (ref 11.1–15.9)
Immature Grans (Abs): 0 10*3/uL (ref 0.0–0.1)
Immature Granulocytes: 0 %
Lymphocytes Absolute: 2.1 10*3/uL (ref 0.7–3.1)
Lymphs: 33 %
MCH: 31.6 pg (ref 26.6–33.0)
MCHC: 32.9 g/dL (ref 31.5–35.7)
MCV: 96 fL (ref 79–97)
Monocytes Absolute: 0.4 10*3/uL (ref 0.1–0.9)
Monocytes: 7 %
Neutrophils Absolute: 3.4 10*3/uL (ref 1.4–7.0)
Neutrophils: 52 %
Platelets: 247 10*3/uL (ref 150–450)
RBC: 4.02 x10E6/uL (ref 3.77–5.28)
RDW: 12.1 % (ref 11.7–15.4)
WBC: 6.5 10*3/uL (ref 3.4–10.8)

## 2023-05-28 LAB — HEMOGLOBIN A1C
Est. average glucose Bld gHb Est-mCnc: 126 mg/dL
Hgb A1c MFr Bld: 6 % — ABNORMAL HIGH (ref 4.8–5.6)

## 2023-06-01 ENCOUNTER — Ambulatory Visit (INDEPENDENT_AMBULATORY_CARE_PROVIDER_SITE_OTHER): Payer: BC Managed Care – PPO | Admitting: Family Medicine

## 2023-06-01 ENCOUNTER — Encounter: Payer: Self-pay | Admitting: Family Medicine

## 2023-06-01 VITALS — BP 128/86 | HR 88 | Resp 20 | Ht 62.0 in | Wt 118.0 lb

## 2023-06-01 DIAGNOSIS — Z23 Encounter for immunization: Secondary | ICD-10-CM | POA: Diagnosis not present

## 2023-06-01 DIAGNOSIS — R7303 Prediabetes: Secondary | ICD-10-CM

## 2023-06-01 DIAGNOSIS — M8580 Other specified disorders of bone density and structure, unspecified site: Secondary | ICD-10-CM | POA: Diagnosis not present

## 2023-06-01 DIAGNOSIS — E782 Mixed hyperlipidemia: Secondary | ICD-10-CM

## 2023-06-01 DIAGNOSIS — E559 Vitamin D deficiency, unspecified: Secondary | ICD-10-CM

## 2023-06-01 DIAGNOSIS — Z780 Asymptomatic menopausal state: Secondary | ICD-10-CM

## 2023-06-01 DIAGNOSIS — Z1329 Encounter for screening for other suspected endocrine disorder: Secondary | ICD-10-CM

## 2023-06-01 DIAGNOSIS — J302 Other seasonal allergic rhinitis: Secondary | ICD-10-CM

## 2023-06-01 MED ORDER — CETIRIZINE HCL 10 MG PO TABS
10.0000 mg | ORAL_TABLET | Freq: Every day | ORAL | 11 refills | Status: AC
Start: 2023-06-01 — End: ?

## 2023-06-01 NOTE — Patient Instructions (Addendum)
Your labs looked fantastic! Only the A1C which measures yoru average blood sugar over 3 months was slightly high. I recommend limiting carbs/sugary foods and drinks and increasing fruits, vegetables, and lean proteins.

## 2023-06-01 NOTE — Progress Notes (Signed)
Established Patient Office Visit  Subjective   Patient ID: Rachael Clark, female    DOB: 1961/08/31  Age: 62 y.o. MRN: [id number]  Chief Complaint  Patient presents with   Hyperlipidemia   Osteoporosis        Allergies    HPI Rachael Clark is a 62 y.o. female presenting today for follow up of hyperlipidemia, osteoporosis.  She also complains of allergy symptoms of runny nose and congestion.  She is not currently taking allergy medication. Hyperlipidemia: tolerating simvastatin 10 mg daily well with no myalgias or significant side effects.  The 10-year ASCVD risk score (Arnett DK, et al., 2019) is: 3% Osteopenia: Currently taking alendronate 70 mg weekly and over-the-counter vitamin D3 2000 units daily.  Continuing to monitor calcium levels, repeat CMP.  Outpatient Medications Prior to Visit  Medication Sig   alendronate (FOSAMAX) 70 MG tablet Take 1 tablet (70 mg total) by mouth every 7 (seven) days. Take with a full glass of water on an empty stomach.   simvastatin (ZOCOR) 10 MG tablet TAKE 1 TABLET (10 MG TOTAL) BY MOUTH DAILY AT 6 PM. FOR CHOLESTEROL.   [DISCONTINUED] guaiFENesin (MUCINEX) 600 MG 12 hr tablet Take 1 tablet (600 mg total) by mouth 2 (two) times daily. For mucus. Drink lots of water.   No facility-administered medications prior to visit.    ROS Negative unless otherwise noted in HPI   Objective:     BP 128/86 (BP Location: Left Arm, Patient Position: Sitting, Cuff Size: Normal)   Pulse 88   Resp 20   Ht 5\' 2"  (1.575 m)   Wt 118 lb (53.5 kg)   SpO2 98%   BMI 21.58 kg/m   Physical Exam Constitutional:      General: She is not in acute distress.    Appearance: Normal appearance.  HENT:     Head: Normocephalic and atraumatic.  Cardiovascular:     Rate and Rhythm: Normal rate and regular rhythm.     Heart sounds: No murmur heard.    No friction rub. No gallop.  Pulmonary:     Effort: Pulmonary effort is normal. No  respiratory distress.     Breath sounds: No wheezing, rhonchi or rales.  Skin:    General: Skin is warm and dry.  Neurological:     Mental Status: She is alert and oriented to person, place, and time.     Assessment & Plan:  Mixed hyperlipidemia Assessment & Plan: Last lipid panel: LDL 93, HDL 85, triglycerides 57.  Encouraged her to continue with low-fat diet and routine physical activity.  Continue simvastatin 10 mg daily. Recheck lipid panel and CMP for medication monitoring in 6 months.  Orders: -     Comprehensive metabolic panel; Future -     Lipid panel; Future  Need for influenza vaccination -     Flu vaccine trivalent PF, 6mos and older(Flulaval,Afluria,Fluarix,Fluzone)  Osteopenia after menopause Assessment & Plan: Continue alendronate 70 mg weekly and vitamin D3 2000 units daily.  Repeat CMP in 6 months to monitor calcium levels.   Repeat DEXA in May 2025 or May 2026 (1-2 years after initiating bisphosphonate therapy).  Orders: -     Comprehensive metabolic panel; Future  Seasonal allergies Assessment & Plan: Sent prescription for to cetirizine 10 mg daily.  Short patient that she can take this daily during allergy season.  Orders: -     Cetirizine HCl; Take 1 tablet (10 mg total) by mouth  daily.  Dispense: 30 tablet; Refill: 11  Prediabetes Assessment & Plan: A1c 6.0.  Encouraged her to limit carbs and sugar.  Recheck A1c in 6 months.     Return in about 6 months (around 11/29/2023) for annual physical, fasting blood work 1 week before.    Melida Quitter, PA

## 2023-06-01 NOTE — Assessment & Plan Note (Signed)
Sent prescription for to cetirizine 10 mg daily.  Short patient that she can take this daily during allergy season.

## 2023-06-01 NOTE — Assessment & Plan Note (Signed)
A1c 6.0.  Encouraged her to limit carbs and sugar.  Recheck A1c in 6 months.

## 2023-06-01 NOTE — Assessment & Plan Note (Signed)
Continue alendronate 70 mg weekly and vitamin D3 2000 units daily.  Repeat CMP in 6 months to monitor calcium levels.   Repeat DEXA in May 2025 or May 2026 (1-2 years after initiating bisphosphonate therapy).

## 2023-06-01 NOTE — Assessment & Plan Note (Signed)
Last lipid panel: LDL 93, HDL 85, triglycerides 57.  Encouraged her to continue with low-fat diet and routine physical activity.  Continue simvastatin 10 mg daily. Recheck lipid panel and CMP for medication monitoring in 6 months.

## 2023-06-02 DIAGNOSIS — G5603 Carpal tunnel syndrome, bilateral upper limbs: Secondary | ICD-10-CM | POA: Diagnosis not present

## 2023-07-09 DIAGNOSIS — Z01818 Encounter for other preprocedural examination: Secondary | ICD-10-CM | POA: Diagnosis not present

## 2023-07-17 DIAGNOSIS — G5601 Carpal tunnel syndrome, right upper limb: Secondary | ICD-10-CM | POA: Diagnosis not present

## 2023-08-13 DIAGNOSIS — G5602 Carpal tunnel syndrome, left upper limb: Secondary | ICD-10-CM | POA: Diagnosis not present

## 2023-08-19 ENCOUNTER — Other Ambulatory Visit: Payer: Self-pay | Admitting: Family Medicine

## 2023-08-19 DIAGNOSIS — E782 Mixed hyperlipidemia: Secondary | ICD-10-CM

## 2023-10-08 ENCOUNTER — Encounter: Payer: Self-pay | Admitting: Family Medicine

## 2023-11-10 ENCOUNTER — Other Ambulatory Visit: Payer: Self-pay | Admitting: Family Medicine

## 2023-11-10 DIAGNOSIS — E782 Mixed hyperlipidemia: Secondary | ICD-10-CM

## 2023-12-02 ENCOUNTER — Other Ambulatory Visit: Payer: BC Managed Care – PPO

## 2023-12-09 ENCOUNTER — Encounter: Payer: BC Managed Care – PPO | Admitting: Family Medicine

## 2023-12-22 ENCOUNTER — Other Ambulatory Visit: Payer: Self-pay | Admitting: Family Medicine

## 2023-12-22 DIAGNOSIS — Z780 Asymptomatic menopausal state: Secondary | ICD-10-CM

## 2024-02-02 ENCOUNTER — Ambulatory Visit (INDEPENDENT_AMBULATORY_CARE_PROVIDER_SITE_OTHER): Admitting: Family Medicine

## 2024-02-02 ENCOUNTER — Encounter: Payer: Self-pay | Admitting: Family Medicine

## 2024-02-02 VITALS — BP 107/67 | HR 64 | Ht 62.0 in | Wt 118.2 lb

## 2024-02-02 DIAGNOSIS — R7303 Prediabetes: Secondary | ICD-10-CM | POA: Diagnosis not present

## 2024-02-02 DIAGNOSIS — E782 Mixed hyperlipidemia: Secondary | ICD-10-CM

## 2024-02-02 DIAGNOSIS — Z0000 Encounter for general adult medical examination without abnormal findings: Secondary | ICD-10-CM | POA: Diagnosis not present

## 2024-02-02 DIAGNOSIS — L989 Disorder of the skin and subcutaneous tissue, unspecified: Secondary | ICD-10-CM | POA: Insufficient documentation

## 2024-02-02 NOTE — Assessment & Plan Note (Signed)
 Stable A1c, 6.0.

## 2024-02-02 NOTE — Progress Notes (Signed)
   Annual physical  Subjective    Patient ID: Rachael Clark, female    DOB: 1960/11/22  Age: 63 y.o. MRN: [id number]  Chief Complaint  Patient presents with   Annual Exam   HPI Ma Andrew Kearns is a 63 y.o. old female here  for annual exam.   Subjective - Annual physical exam - Reports carpal tunnel surgery last year with resolution of primary symptoms, but still experiences occasional joint pain - Uses Tylenol  as needed for joint pain - Has persistent skin lesion for several years that occasionally itches and bleeds when scratched - Received email about scheduling colonoscopy - Last colonoscopy in 2020 showed one polyp - DEXA scan in 2024 was normal - Works in Engineering geologist - Married - No tobacco use - Occasional wine consumption - Follows low carb diet, avoids sugar - No specialists seen outside primary care  Medications: simvastatin , Fosamax , Zyrtec , previously on meloxicam  but discontinued  PMH, PSH, FH, Social Hx: hysterectomy, carpal tunnel surgery last year, no family history of breast cancer, works in Engineering geologist, married  ROS: denies tobacco use, reports occasional wine consumption, follows low carb diet, avoids sugar, reports joint pain    The 10-year ASCVD risk score (Arnett DK, et al., 2019) is: 2.2%  Health Maintenance Due  Topic Date Due   COVID-19 Vaccine (3 - Pfizer risk series) 03/08/2020      Objective:     BP 107/67   Pulse 64   Ht 5\' 2"  (1.575 m)   Wt 118 lb 4 oz (53.6 kg)   SpO2 100%   BMI 21.63 kg/m    Physical Exam General: Alert, oriented CV: Regular rate and rhythm Pulmonary: Lungs clear bilaterally GI: Soft, bowel sounds MSK: Strength bilaterally Extremities: No pedal edema Skin: Warm and dry.  Small circular hyperpigmented nodule on the anterior shin Psych: Pleasant affect   No results found for any visits on 02/02/24.      Assessment & Plan:   Physical exam, annual  Mixed hyperlipidemia Assessment &  Plan: Continue simvastatin . Rechecking lipid panel.   Orders: -     CBC with Differential/Platelet -     Comprehensive metabolic panel with GFR -     Lipid panel  Prediabetes Assessment & Plan: Stable A1c, 6.0.    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    Laneta Pintos, MD

## 2024-02-02 NOTE — Patient Instructions (Signed)
 It was nice to see you today,  We addressed the following topics today: -I am ordering some lab test to follow-up routinely will things like cholesterol and blood sugar.  I will let you know the results when I get them - Please schedule a visit to have the biopsy performed on your leg.  Have a great day,  Etha Henle, MD

## 2024-02-02 NOTE — Assessment & Plan Note (Signed)
 Continue simvastatin . Rechecking lipid panel.

## 2024-02-02 NOTE — Assessment & Plan Note (Signed)
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

## 2024-02-03 ENCOUNTER — Ambulatory Visit: Payer: Self-pay | Admitting: Family Medicine

## 2024-02-03 LAB — CBC WITH DIFFERENTIAL/PLATELET
Basophils Absolute: 0.1 10*3/uL (ref 0.0–0.2)
Basos: 1 %
EOS (ABSOLUTE): 0.3 10*3/uL (ref 0.0–0.4)
Eos: 4 %
Hematocrit: 39.8 % (ref 34.0–46.6)
Hemoglobin: 13.2 g/dL (ref 11.1–15.9)
Immature Grans (Abs): 0 10*3/uL (ref 0.0–0.1)
Immature Granulocytes: 0 %
Lymphocytes Absolute: 2.3 10*3/uL (ref 0.7–3.1)
Lymphs: 33 %
MCH: 32 pg (ref 26.6–33.0)
MCHC: 33.2 g/dL (ref 31.5–35.7)
MCV: 97 fL (ref 79–97)
Monocytes Absolute: 0.5 10*3/uL (ref 0.1–0.9)
Monocytes: 8 %
Neutrophils Absolute: 3.6 10*3/uL (ref 1.4–7.0)
Neutrophils: 54 %
Platelets: 252 10*3/uL (ref 150–450)
RBC: 4.12 x10E6/uL (ref 3.77–5.28)
RDW: 12 % (ref 11.7–15.4)
WBC: 6.7 10*3/uL (ref 3.4–10.8)

## 2024-02-03 LAB — COMPREHENSIVE METABOLIC PANEL WITH GFR
ALT: 18 IU/L (ref 0–32)
AST: 21 IU/L (ref 0–40)
Albumin: 4.5 g/dL (ref 3.9–4.9)
Alkaline Phosphatase: 73 IU/L (ref 44–121)
BUN/Creatinine Ratio: 17 (ref 12–28)
BUN: 10 mg/dL (ref 8–27)
Bilirubin Total: 1.1 mg/dL (ref 0.0–1.2)
CO2: 21 mmol/L (ref 20–29)
Calcium: 9.3 mg/dL (ref 8.7–10.3)
Chloride: 103 mmol/L (ref 96–106)
Creatinine, Ser: 0.59 mg/dL (ref 0.57–1.00)
Globulin, Total: 2.2 g/dL (ref 1.5–4.5)
Glucose: 98 mg/dL (ref 70–99)
Potassium: 4.5 mmol/L (ref 3.5–5.2)
Sodium: 139 mmol/L (ref 134–144)
Total Protein: 6.7 g/dL (ref 6.0–8.5)
eGFR: 102 mL/min/{1.73_m2} (ref >59)

## 2024-02-03 LAB — TSH: TSH: 1.24 u[IU]/mL (ref 0.450–4.500)

## 2024-02-03 LAB — LIPID PANEL
Chol/HDL Ratio: 2.3 ratio (ref 0.0–4.4)
Cholesterol, Total: 190 mg/dL (ref 100–199)
HDL: 84 mg/dL (ref >39)
LDL Chol Calc (NIH): 94 mg/dL (ref 0–99)
Triglycerides: 64 mg/dL (ref 0–149)
VLDL Cholesterol Cal: 12 mg/dL (ref 5–40)

## 2024-02-03 LAB — HEMOGLOBIN A1C
Est. average glucose Bld gHb Est-mCnc: 114 mg/dL
Hgb A1c MFr Bld: 5.6 % (ref 4.8–5.6)

## 2024-02-10 ENCOUNTER — Other Ambulatory Visit: Payer: Self-pay | Admitting: Family Medicine

## 2024-02-10 DIAGNOSIS — Z1231 Encounter for screening mammogram for malignant neoplasm of breast: Secondary | ICD-10-CM

## 2024-02-23 ENCOUNTER — Ambulatory Visit
Admission: RE | Admit: 2024-02-23 | Discharge: 2024-02-23 | Disposition: A | Discharge status: Home / Self Care | Source: Ambulatory Visit | Attending: Family Medicine | Admitting: Family Medicine

## 2024-02-23 DIAGNOSIS — Z1231 Encounter for screening mammogram for malignant neoplasm of breast: Secondary | ICD-10-CM | POA: Insufficient documentation

## 2024-03-24 ENCOUNTER — Other Ambulatory Visit (HOSPITAL_COMMUNITY)
Admission: RE | Admit: 2024-03-24 | Discharge: 2024-03-24 | Disposition: A | Discharge status: Home / Self Care | Source: Ambulatory Visit | Attending: Family Medicine | Admitting: Family Medicine

## 2024-03-24 ENCOUNTER — Encounter: Payer: Self-pay | Admitting: Family Medicine

## 2024-03-24 ENCOUNTER — Ambulatory Visit (INDEPENDENT_AMBULATORY_CARE_PROVIDER_SITE_OTHER): Admitting: Family Medicine

## 2024-03-24 VITALS — BP 104/66 | HR 57 | Ht 62.0 in | Wt 117.4 lb

## 2024-03-24 DIAGNOSIS — L989 Disorder of the skin and subcutaneous tissue, unspecified: Secondary | ICD-10-CM

## 2024-03-24 DIAGNOSIS — L859 Epidermal thickening, unspecified: Secondary | ICD-10-CM | POA: Diagnosis not present

## 2024-03-24 DIAGNOSIS — D2371 Other benign neoplasm of skin of right lower limb, including hip: Secondary | ICD-10-CM | POA: Diagnosis not present

## 2024-03-24 NOTE — Patient Instructions (Signed)
 It was nice to see you today,  We addressed the following topics today: -Leave the bandage on for the rest of the day, you can take it off tomorrow.  Clean it by just using soap and water in the shower.  You do not need to put anything else on it.  You could put Vaseline if you decide you want something to cover it but it should heal fine on its own. - No soaking in anything such as bath hot tub or pool - If you have any concerns about it whether it is bleeding issues or concern about possible infection let us  know and we will take a look at it.  Have a great day,  Rolan Slain, MD

## 2024-03-24 NOTE — Progress Notes (Unsigned)
 Punch Biopsy Procedure Note  Pre-operative Diagnosis: Suspicious lesion  Post-operative Diagnosis: same  Locations:lower, right leg  Indications: ***  Anesthesia: Lidocaine  2% with epinephrine  without added sodium bicarbonate  Procedure Details  History of allergy to iodine: no Patient informed of the risks (including bleeding and infection) and benefits of the  procedure and Verbal informed consent obtained.  The lesion and surrounding area was given a sterile prep using alcohol and draped in the usual sterile fashion. The skin was then stretched perpendicular to the skin tension lines and the lesion removed using the 3.63mm punch. The resulting ellipse was then closed. Antibiotic ointment and a sterile dressing applied. The wound was left to heal via secondary intention.The specimen was sent for pathologic examination. The patient tolerated the procedure well.  EBL: 1 ml   Condition: Stable  Complications: none.  Plan: 1. Instructed to keep the wound dry and covered for 24-48h and clean thereafter. 2. Warning signs of infection were reviewed.   3. Recommended that the patient use OTC analgesics as needed for pain.

## 2024-03-28 ENCOUNTER — Ambulatory Visit: Payer: Self-pay

## 2024-03-28 LAB — SURGICAL PATHOLOGY

## 2024-05-07 ENCOUNTER — Other Ambulatory Visit: Payer: Self-pay | Admitting: Family Medicine

## 2024-05-07 DIAGNOSIS — E782 Mixed hyperlipidemia: Secondary | ICD-10-CM

## 2024-05-10 ENCOUNTER — Ambulatory Visit (INDEPENDENT_AMBULATORY_CARE_PROVIDER_SITE_OTHER): Admitting: Family Medicine

## 2024-05-10 ENCOUNTER — Encounter: Payer: Self-pay | Admitting: Family Medicine

## 2024-05-10 VITALS — BP 118/76 | HR 64 | Ht 62.0 in | Wt 119.8 lb

## 2024-05-10 DIAGNOSIS — J320 Chronic maxillary sinusitis: Secondary | ICD-10-CM

## 2024-05-10 MED ORDER — AMOXICILLIN-POT CLAVULANATE 875-125 MG PO TABS: 1.0000 | ORAL_TABLET | Freq: Two times a day (BID) | ORAL | 0 refills | Status: AC

## 2024-05-10 NOTE — Progress Notes (Unsigned)
   Acute Office Visit  Subjective:     Patient ID: Rachael Clark, female    DOB: 05-21-1961, 63 y.o.   MRN: [id number]  Chief Complaint  Patient presents with   Sinus Problem    HPI Patient is in today for   Subjective - Sinus issues for approximately one year, worsening over the last two weeks. - Reports inability to breathe through one side of the nose, dizziness, headache, and a sensation of fluid internally. - Sensation of post-nasal drip when sleeping. - Pain and headache localized over the sinus area. - Onset is not seasonal. - Reports nasal stuffiness, headache, and occasional dizziness. - Denies fever or cough. - Reports a significant cold/flu-like illness in [place] about a year ago, with sinus symptoms persisting and worsening since that time.  Medications Reports taking Zyrtec  for allergies in the past, which was effective for allergy symptoms, but is not taking any current medications for sinus symptoms.  PMH, PSH, FH, Social Hx PMHx: Allergies, distinguishes current symptoms from typical allergy presentation. Social Hx: Reports frequent travel, including to [place] and [place].  ROS HEENT: Reports nasal congestion, headache, dizziness, post-nasal drip. Denies sneezing. Constitutional: Denies fever. Respiratory: Denies cough.  Objective General: No acute distress. HEENT: Tenderness to palpation over sinuses. No tenderness to percussion. Oropharynx clear. Decreased airflow noted in one nostril.  Assessment and Plan Sinusitis - Presents with a one-year history of sinus symptoms that have significantly worsened over the past two weeks, characterized by unilateral nasal obstruction, sinus pressure, headache, dizziness, and post-nasal drip. Symptoms began after a flu-like illness in [place] a year ago. Denies fever or cough. Examination reveals sinus tenderness. The presentation is suspicious for a bacterial component given the duration and recent  worsening. - Prescribed an antibiotic for 7 days. - If symptoms do not improve after completing the antibiotic course, recommended to start over-the-counter Flonase, two sprays in each nostril once daily for at least two weeks to address potential underlying inflammation or allergic components. Counseled on usage.   ROS      Objective:    BP 118/76   Pulse 64   Ht 5' 2 (1.575 m)   Wt 119 lb 12.8 oz (54.3 kg)   SpO2 96%   BMI 21.91 kg/m  {Vitals History (Optional):23777}  Physical Exam  No results found for any visits on 05/10/24.      Assessment & Plan:   There are no diagnoses linked to this encounter.   No follow-ups on file.  Toribio MARLA Slain, MD

## 2024-05-10 NOTE — Patient Instructions (Signed)
 It was nice to see you today,  We addressed the following topics today: -I am sending in an antibiotic for you to take twice a day for the next 7 days. - If after 7 days you do not feel like it has helped your symptoms, I would like you to go to the pharmacy and get over-the-counter Flonase (fluticasone).  Use this by spraying it in each nostril 2 times a day for at least 2 weeks. - If neither of these help please let us  know.  Have a great day,  Rolan Slain, MD

## 2024-05-13 DIAGNOSIS — J320 Chronic maxillary sinusitis: Secondary | ICD-10-CM | POA: Insufficient documentation

## 2024-05-13 NOTE — Assessment & Plan Note (Signed)
-   Presents with a one-year history of sinus symptoms that have significantly worsened over the past two weeks, characterized by unilateral nasal obstruction, sinus pressure, headache, dizziness, and post-nasal drip. Symptoms began after a flu-like illness in [place] a year ago. Denies fever or cough. Examination reveals sinus tenderness. The presentation is suspicious for a bacterial component given the duration and recent worsening. - Prescribed augmentin  for 7 days. - If symptoms do not improve after completing the antibiotic course, recommended to start over-the-counter Flonase, two sprays in each nostril once daily for at least two weeks to address potential underlying inflammation or allergic components. Counseled on usage.

## 2024-07-01 ENCOUNTER — Ambulatory Visit (INDEPENDENT_AMBULATORY_CARE_PROVIDER_SITE_OTHER)

## 2024-07-01 DIAGNOSIS — Z23 Encounter for immunization: Secondary | ICD-10-CM

## 2024-07-06 ENCOUNTER — Telehealth: Payer: Self-pay

## 2024-07-06 ENCOUNTER — Other Ambulatory Visit: Payer: Self-pay

## 2024-07-06 DIAGNOSIS — Z1211 Encounter for screening for malignant neoplasm of colon: Secondary | ICD-10-CM

## 2024-07-06 NOTE — Telephone Encounter (Signed)
 I already placed this referral yesterday and it is in the chart

## 2024-07-06 NOTE — Telephone Encounter (Signed)
 Error

## 2024-07-06 NOTE — Telephone Encounter (Signed)
 Copied from CRM [id number]. Topic: Referral - Request for Referral >> Jul 06, 2024  8:14 AM Emylou G wrote: Did the patient discuss referral with their provider in the last year? No (If No - schedule appointment) (If Yes - send message)  Appointment offered? No  Type of order/referral and detailed reason for visit: colonoscopy and gastro.. would like to do it at the same time?  Preference of office, provider, location: [facility] .[address] [facility].. [phone]  If referral order, have you been seen by this specialty before? No (If Yes, this issue or another issue? When? Where?  Can we respond through MyChart? Yes

## 2024-07-06 NOTE — Telephone Encounter (Signed)
 Order placed

## 2024-07-14 NOTE — Telephone Encounter (Signed)
 Lvm for pt to clarify exactly what colonoscopy and gastro means? Please send to the office if she calls back.

## 2024-07-14 NOTE — Telephone Encounter (Signed)
 I have sent a message to Shelley Ford to let her know that the patients wants to go to [facility] to see Dr. Corinn [place].

## 2024-07-19 ENCOUNTER — Telehealth: Payer: Self-pay

## 2024-07-19 NOTE — Telephone Encounter (Signed)
 Copied from CRM [id number]. Topic: Referral - Request for Referral >> Jul 19, 2024  8:14 AM Darshell M wrote: Did the patient discuss referral with their provider in the last year? Yes (If No - schedule appointment) (If Yes - send message)  Appointment offered? No  Type of order/referral and detailed reason for visit: Gastroenterology  Preference of office, provider, location: Dr Cor[facility].. [phone] (Original referral request was for Dr. Medicine but the referral was sent to [facility]. Please send new referral request for Dr. Medicine)  If referral order, have you been seen by this specialty before? Yes (If Yes, this issue or another issue? When? Where? Patient had colonoscopy with Dr. Medicine  Can we respond through MyChart? Yes

## 2024-07-19 NOTE — Telephone Encounter (Signed)
 I looked at the referral from 11/5 and it requests Dr. Adams specifically.  I am not sure why it was not sent to their office.

## 2024-07-20 ENCOUNTER — Encounter (HOSPITAL_BASED_OUTPATIENT_CLINIC_OR_DEPARTMENT_OTHER): Payer: Self-pay

## 2024-07-21 NOTE — Telephone Encounter (Signed)
 I have changed this referral and sent the proper information to the requested provider.

## 2024-08-05 ENCOUNTER — Encounter (HOSPITAL_COMMUNITY): Payer: Self-pay | Admitting: General Surgery

## 2024-08-08 ENCOUNTER — Encounter: Payer: Self-pay | Admitting: Family Medicine

## 2024-08-08 ENCOUNTER — Ambulatory Visit

## 2024-08-08 DIAGNOSIS — Z23 Encounter for immunization: Secondary | ICD-10-CM | POA: Diagnosis not present

## 2024-08-08 NOTE — Progress Notes (Signed)
 Patient is here for a pneumococcal vaccine pt tolerated the injection

## 2024-10-14 ENCOUNTER — Encounter: Admission: RE | Payer: Self-pay | Source: Home / Self Care

## 2024-10-14 ENCOUNTER — Ambulatory Visit: Admit: 2024-10-14 | Admitting: Gastroenterology

## 2025-02-02 ENCOUNTER — Encounter: Admitting: Family Medicine
